# Patient Record
Sex: Male | Born: 1935 | Race: Black or African American | Hispanic: No | Marital: Married | Smoking: Former smoker
Health system: Southern US, Community
[De-identification: ages and names within clinical notes are randomized; demographics above are authoritative.]

## PROBLEM LIST (undated history)

## (undated) DIAGNOSIS — G619 Inflammatory polyneuropathy, unspecified: Secondary | ICD-10-CM

## (undated) DIAGNOSIS — G629 Polyneuropathy, unspecified: Secondary | ICD-10-CM

## (undated) DIAGNOSIS — M2012 Hallux valgus (acquired), left foot: Secondary | ICD-10-CM

## (undated) DIAGNOSIS — I160 Hypertensive urgency: Secondary | ICD-10-CM

## (undated) DIAGNOSIS — I6381 Other cerebral infarction due to occlusion or stenosis of small artery: Secondary | ICD-10-CM

## (undated) DIAGNOSIS — C61 Malignant neoplasm of prostate: Secondary | ICD-10-CM

## (undated) DIAGNOSIS — N189 Chronic kidney disease, unspecified: Secondary | ICD-10-CM

## (undated) DIAGNOSIS — M2011 Hallux valgus (acquired), right foot: Secondary | ICD-10-CM

## (undated) DIAGNOSIS — R011 Cardiac murmur, unspecified: Secondary | ICD-10-CM

## (undated) DIAGNOSIS — D649 Anemia, unspecified: Secondary | ICD-10-CM

## (undated) DIAGNOSIS — I739 Peripheral vascular disease, unspecified: Secondary | ICD-10-CM

## (undated) DIAGNOSIS — I679 Cerebrovascular disease, unspecified: Secondary | ICD-10-CM

## (undated) DIAGNOSIS — I10 Essential (primary) hypertension: Secondary | ICD-10-CM

## (undated) DIAGNOSIS — M549 Dorsalgia, unspecified: Secondary | ICD-10-CM

## (undated) HISTORY — PX: PROSTATE BIOPSY: SHX241

## (undated) HISTORY — PX: BACK SURGERY: SHX140

## (undated) HISTORY — DX: Hallux valgus (acquired), left foot: M20.12

## (undated) HISTORY — DX: Cardiac murmur, unspecified: R01.1

## (undated) HISTORY — DX: Cerebrovascular disease, unspecified: I67.9

## (undated) HISTORY — DX: Hypertensive urgency: I16.0

## (undated) HISTORY — DX: Polyneuropathy, unspecified: G62.9

## (undated) HISTORY — DX: Peripheral vascular disease, unspecified: I73.9

## (undated) HISTORY — DX: Hallux valgus (acquired), right foot: M20.11

## (undated) HISTORY — DX: Other cerebral infarction due to occlusion or stenosis of small artery: I63.81

## (undated) HISTORY — DX: Anemia, unspecified: D64.9

## (undated) HISTORY — DX: Chronic kidney disease, unspecified: N18.9

## (undated) HISTORY — DX: Dorsalgia, unspecified: M54.9

## (undated) HISTORY — DX: Inflammatory polyneuropathy, unspecified: G61.9

---

## 1998-03-29 ENCOUNTER — Encounter: Payer: Self-pay | Admitting: Specialist

## 1998-03-31 ENCOUNTER — Ambulatory Visit (HOSPITAL_COMMUNITY): Admission: RE | Admit: 1998-03-31 | Discharge: 1998-03-31 | Payer: Self-pay | Admitting: Specialist

## 1998-08-31 ENCOUNTER — Observation Stay (HOSPITAL_COMMUNITY): Admission: RE | Admit: 1998-08-31 | Discharge: 1998-09-01 | Payer: Self-pay | Admitting: Specialist

## 1998-08-31 ENCOUNTER — Encounter: Payer: Self-pay | Admitting: Specialist

## 2003-11-02 ENCOUNTER — Ambulatory Visit (HOSPITAL_COMMUNITY): Admission: RE | Admit: 2003-11-02 | Discharge: 2003-11-02 | Payer: Self-pay | Admitting: Gastroenterology

## 2011-01-21 ENCOUNTER — Emergency Department (INDEPENDENT_AMBULATORY_CARE_PROVIDER_SITE_OTHER): Payer: Medicare Other

## 2011-01-21 ENCOUNTER — Encounter: Payer: Self-pay | Admitting: *Deleted

## 2011-01-21 ENCOUNTER — Other Ambulatory Visit: Payer: Self-pay

## 2011-01-21 ENCOUNTER — Emergency Department (HOSPITAL_BASED_OUTPATIENT_CLINIC_OR_DEPARTMENT_OTHER)
Admission: EM | Admit: 2011-01-21 | Discharge: 2011-01-21 | Disposition: A | Discharge status: Home / Self Care | Payer: Medicare Other | Attending: Emergency Medicine | Admitting: Emergency Medicine

## 2011-01-21 DIAGNOSIS — Z79899 Other long term (current) drug therapy: Secondary | ICD-10-CM | POA: Insufficient documentation

## 2011-01-21 DIAGNOSIS — E860 Dehydration: Secondary | ICD-10-CM | POA: Insufficient documentation

## 2011-01-21 DIAGNOSIS — R55 Syncope and collapse: Secondary | ICD-10-CM | POA: Insufficient documentation

## 2011-01-21 DIAGNOSIS — I10 Essential (primary) hypertension: Secondary | ICD-10-CM | POA: Insufficient documentation

## 2011-01-21 HISTORY — DX: Essential (primary) hypertension: I10

## 2011-01-21 LAB — URINALYSIS, ROUTINE W REFLEX MICROSCOPIC
Glucose, UA: NEGATIVE mg/dL
Hgb urine dipstick: NEGATIVE
Ketones, ur: 15 mg/dL — AB
Nitrite: NEGATIVE
Protein, ur: 100 mg/dL — AB
Specific Gravity, Urine: 1.025 (ref 1.005–1.030)
Urobilinogen, UA: 1 mg/dL (ref 0.0–1.0)
pH: 5 (ref 5.0–8.0)

## 2011-01-21 LAB — DIFFERENTIAL
Basophils Absolute: 0 10*3/uL (ref 0.0–0.1)
Basophils Relative: 0 % (ref 0–1)
Eosinophils Absolute: 0.1 10*3/uL (ref 0.0–0.7)
Eosinophils Relative: 1 % (ref 0–5)
Lymphocytes Relative: 7 % — ABNORMAL LOW (ref 12–46)
Lymphs Abs: 0.8 10*3/uL (ref 0.7–4.0)
Monocytes Absolute: 0.6 10*3/uL (ref 0.1–1.0)
Monocytes Relative: 5 % (ref 3–12)
Neutro Abs: 10.2 10*3/uL — ABNORMAL HIGH (ref 1.7–7.7)
Neutrophils Relative %: 87 % — ABNORMAL HIGH (ref 43–77)

## 2011-01-21 LAB — BASIC METABOLIC PANEL
BUN: 19 mg/dL (ref 6–23)
CO2: 27 mEq/L (ref 19–32)
Calcium: 9.3 mg/dL (ref 8.4–10.5)
Chloride: 104 mEq/L (ref 96–112)
Creatinine, Ser: 1.3 mg/dL (ref 0.50–1.35)
GFR calc Af Amer: 61 mL/min — ABNORMAL LOW (ref >90)
GFR calc non Af Amer: 53 mL/min — ABNORMAL LOW (ref >90)
Glucose, Bld: 109 mg/dL — ABNORMAL HIGH (ref 70–99)
Potassium: 3.8 mEq/L (ref 3.5–5.1)
Sodium: 140 mEq/L (ref 135–145)

## 2011-01-21 LAB — CBC
HCT: 36.5 % — ABNORMAL LOW (ref 39.0–52.0)
Hemoglobin: 12 g/dL — ABNORMAL LOW (ref 13.0–17.0)
MCH: 29.9 pg (ref 26.0–34.0)
MCHC: 32.9 g/dL (ref 30.0–36.0)
MCV: 91 fL (ref 78.0–100.0)
Platelets: 237 10*3/uL (ref 150–400)
RBC: 4.01 MIL/uL — ABNORMAL LOW (ref 4.22–5.81)
RDW: 11.7 % (ref 11.5–15.5)
WBC: 11.7 10*3/uL — ABNORMAL HIGH (ref 4.0–10.5)

## 2011-01-21 LAB — URINE MICROSCOPIC-ADD ON

## 2011-01-21 LAB — TROPONIN I: Troponin I: <0.3 ng/mL (ref <0.30)

## 2011-01-21 NOTE — ED Notes (Signed)
MD at bedside. 

## 2011-01-21 NOTE — ED Notes (Signed)
Pt reports a syncopal episode PTA at approximately 1500.  Spouse reports pt had extremity "shaking".  Pt denies any pain or other symptoms at present time.

## 2011-01-21 NOTE — ED Notes (Signed)
Had a syncopal episode. He was passed out for a few seconds. States he feels fine now.

## 2011-01-21 NOTE — ED Provider Notes (Signed)
History   This chart was scribed for Cyndra Numbers, MD by Melba Coon. The patient was seen in room MH04/MH04 and the patient's care was started at 4:50PM    CSN: [id number]  Arrival date & time 01/21/11  1638   First MD Initiated Contact with Patient 01/21/11 1648      Chief Complaint  Patient presents with  . Loss of Consciousness    (Consider location/radiation/quality/duration/timing/severity/associated sxs/prior treatment) HPI  Derek Blackburn is a 75 y.o. male who presents to the Emergency Department complaining of possible syncope with an onset 2 hrs ago. Pt believes he had a food reaction to ham (hasn't eaten pork in 4 yrs). Pt was talking with his son when he became lightheaded for a few seconds but claims he did not lose consciousness. Wife witnessed slight shaking "like chills". Pt did not fall out of chair. During the event, he could hear everything around him. There was no associated incontinence. After the event, wife called for help, thinking it was a stroke, and son drove them to the ED. Pt did not want to come into the ED. At the time of physical, pt stated that he felt perfectly fine. No numbness, tingling, trouble breathing, swelling, chest pain, abdominal pain, urination, defecation, dark tarry stools, UTIs, or swelling in throat. Pt recently had a physical 2 months ago; evaluation was normal. No Hx of diabetes.  Patient was feeling fine prior to the event.      Past Medical History  Diagnosis Date  . Hypertension     Past Surgical History  Procedure Date  . Back surgery     History reviewed. No pertinent family history.  History  Substance Use Topics  . Smoking status: Never Smoker   . Smokeless tobacco: Not on file  . Alcohol Use: Yes     occasionally      Review of Systems 10 Systems reviewed and are negative for acute change except as noted in the HPI.  Allergies  Review of patient's allergies indicates no known allergies.  Home  Medications   Current Outpatient Rx  Name Route Sig Dispense Refill  . ASPIRIN 325 MG PO TBEC Oral Take 325 mg by mouth once.      Marland Kitchen BIMATOPROST 0.03 % OP SOLN Both Eyes Place 1 drop into both eyes at bedtime.      Marland Kitchen DOXAZOSIN MESYLATE 4 MG PO TABS Oral Take 4 mg by mouth daily.     . ADULT MULTIVITAMIN W/MINERALS CH Oral Take 1 tablet by mouth daily.      . TRANDOLAPRIL 2 MG PO TABS Oral Take 2 mg by mouth daily.      Marland Kitchen VERAPAMIL HCL 240 MG (CO) PO TB24 Oral Take 240 mg by mouth daily.       BP 122/72  Pulse 77  Temp(Src) 97.5 F (36.4 C) (Oral)  Resp 16  Ht 5\' 10"  (1.778 m)  Wt 172 lb (78.019 kg)  BMI 24.68 kg/m2  SpO2 99%  Physical Exam  Nursing note and vitals reviewed. Constitutional: He is oriented to person, place, and time. He appears well-developed and well-nourished. No distress.  HENT:  Head: Normocephalic and atraumatic.  Eyes: Conjunctivae and EOM are normal. Pupils are equal, round, and reactive to light.  Neck: Normal range of motion. Neck supple. No tracheal deviation present.  Cardiovascular: Normal rate and regular rhythm.   Pulmonary/Chest: Effort normal and breath sounds normal. No respiratory distress.  Abdominal: Soft. He exhibits no distension.  Musculoskeletal:  Normal range of motion. He exhibits no edema.  Neurological: He is alert and oriented to person, place, and time. He has normal strength and normal reflexes. He displays normal reflexes. No cranial nerve deficit or sensory deficit. He exhibits normal muscle tone. Coordination normal.       Nrml finger-to-nose bilateral. Nml heel-to-shin bilateral.  Skin: Skin is warm and dry. No rash noted.  Psychiatric: He has a normal mood and affect. His behavior is normal.    ED Course  Procedures (including critical care time)   Date: 01/21/2011  Rate: 78  Rhythm: normal sinus rhythm  QRS Axis: normal  Intervals: normal  ST/T Wave abnormalities: normal  Conduction Disutrbances:none  Narrative  Interpretation:   Old EKG Reviewed: none available  DIAGNOSTIC STUDIES: Oxygen Saturation is 98% on room air, normal by my interpretation.    COORDINATION OF CARE:  4:50 - Pt told Dr that he feels fine and is ready to go home. Dr agrees to pt wishes after he complete medical tests. Dr advises pt to f/u w/ PCP.  6:30PM - Dr advises discharge; still slightly dehydrated. Pt advised to take oral fluids.pt leaves asymptomatic.   Labs Reviewed  CBC - Abnormal; Notable for the following:    WBC 11.7 (*)    RBC 4.01 (*)    Hemoglobin 12.0 (*)    HCT 36.5 (*)    All other components within normal limits  DIFFERENTIAL - Abnormal; Notable for the following:    Neutrophils Relative 87 (*)    Neutro Abs 10.2 (*)    Lymphocytes Relative 7 (*)    All other components within normal limits  BASIC METABOLIC PANEL - Abnormal; Notable for the following:    Glucose, Bld 109 (*)    GFR calc non Af Amer 53 (*)    GFR calc Af Amer 61 (*)    All other components within normal limits  URINALYSIS, ROUTINE W REFLEX MICROSCOPIC - Abnormal; Notable for the following:    Color, Urine ORANGE (*) BIOCHEMICALS MAY BE AFFECTED BY COLOR   APPearance CLOUDY (*)    Bilirubin Urine SMALL (*)    Ketones, ur 15 (*)    Protein, ur 100 (*)    Leukocytes, UA SMALL (*)    All other components within normal limits  URINE MICROSCOPIC-ADD ON - Abnormal; Notable for the following:    Bacteria, UA FEW (*)    All other components within normal limits  TROPONIN I   Ct Head Wo Contrast  01/21/2011  *RADIOLOGY REPORT*  Clinical Data: Presyncope  CT HEAD WITHOUT CONTRAST  Technique:  Contiguous axial images were obtained from the base of the skull through the vertex without contrast.  Comparison: None.  Findings: There is no evidence for acute hemorrhage, hydrocephalus, mass lesion, or abnormal extra-axial fluid collection.  No definite CT evidence of acute infarct.  Bone windows show the visualized portions of the paranasal  sinuses to be clear.  IMPRESSION: Normal exam.  Original Report Authenticated By: ERIC A. MANSELL, M.D.     1. Dehydration       MDM  At the time of presentation patient was completely asymptomatic with normal vital signs. Given that he had had an episode of near-syncope testing was performed. EKG was within normal limits. CT head showed no acute intracranial hemorrhage. Neurologic exam was completely normal and I did not have significant suspicion for this being a stroke or TIA. Patient describes feeling lightheaded and being able to hear throughout the events.  Additionally presentation was not consistent with a seizure. Patient had had no associated chest pain. Laboratory workup that reflected dehydration. Patient preferred oral rehydration. He declined IV fluids here. He is not lightheaded upon standing. Patient remained completely stable while here. He can followup with his primary care physician. Patient and family are comfortable with plan he was discharged in good condition.  I personally performed the services described in this documentation, which was scribed in my presence. The recorded information has been reviewed and considered.         Cyndra Numbers, MD 01/21/11 [phone]

## 2011-01-21 NOTE — ED Notes (Signed)
Pt transported to CT ?

## 2011-01-21 NOTE — ED Notes (Signed)
Secondary Assessment-  Pt reports he was standing in the kitchen speaking with his wife when he had a sudden onset of near syncope, dizziness and tremors that lasted seconds.  Denies any confusion, incontinence, pain or other symptoms.  States he feels fine now.

## 2011-03-26 DIAGNOSIS — H409 Unspecified glaucoma: Secondary | ICD-10-CM | POA: Diagnosis not present

## 2011-03-26 DIAGNOSIS — H4010X0 Unspecified open-angle glaucoma, stage unspecified: Secondary | ICD-10-CM | POA: Diagnosis not present

## 2011-03-26 DIAGNOSIS — H2510 Age-related nuclear cataract, unspecified eye: Secondary | ICD-10-CM | POA: Diagnosis not present

## 2011-09-24 DIAGNOSIS — H409 Unspecified glaucoma: Secondary | ICD-10-CM | POA: Diagnosis not present

## 2011-09-24 DIAGNOSIS — H4010X0 Unspecified open-angle glaucoma, stage unspecified: Secondary | ICD-10-CM | POA: Diagnosis not present

## 2011-10-28 DIAGNOSIS — R609 Edema, unspecified: Secondary | ICD-10-CM | POA: Diagnosis not present

## 2011-10-28 DIAGNOSIS — Z79899 Other long term (current) drug therapy: Secondary | ICD-10-CM | POA: Diagnosis not present

## 2011-10-28 DIAGNOSIS — I10 Essential (primary) hypertension: Secondary | ICD-10-CM | POA: Diagnosis not present

## 2011-11-05 DIAGNOSIS — I825Z9 Chronic embolism and thrombosis of unspecified deep veins of unspecified distal lower extremity: Secondary | ICD-10-CM | POA: Diagnosis not present

## 2011-11-05 DIAGNOSIS — M7989 Other specified soft tissue disorders: Secondary | ICD-10-CM | POA: Diagnosis not present

## 2011-11-05 DIAGNOSIS — R609 Edema, unspecified: Secondary | ICD-10-CM | POA: Diagnosis not present

## 2011-11-05 DIAGNOSIS — I824Y9 Acute embolism and thrombosis of unspecified deep veins of unspecified proximal lower extremity: Secondary | ICD-10-CM | POA: Diagnosis not present

## 2011-11-12 DIAGNOSIS — Z7901 Long term (current) use of anticoagulants: Secondary | ICD-10-CM | POA: Diagnosis not present

## 2011-11-19 DIAGNOSIS — Z7901 Long term (current) use of anticoagulants: Secondary | ICD-10-CM | POA: Diagnosis not present

## 2011-12-17 DIAGNOSIS — Z7901 Long term (current) use of anticoagulants: Secondary | ICD-10-CM | POA: Diagnosis not present

## 2011-12-31 DIAGNOSIS — Z7901 Long term (current) use of anticoagulants: Secondary | ICD-10-CM | POA: Diagnosis not present

## 2012-01-02 DIAGNOSIS — Z125 Encounter for screening for malignant neoplasm of prostate: Secondary | ICD-10-CM | POA: Diagnosis not present

## 2012-01-02 DIAGNOSIS — Z1212 Encounter for screening for malignant neoplasm of rectum: Secondary | ICD-10-CM | POA: Diagnosis not present

## 2012-01-02 DIAGNOSIS — Z79899 Other long term (current) drug therapy: Secondary | ICD-10-CM | POA: Diagnosis not present

## 2012-01-02 DIAGNOSIS — I825Z9 Chronic embolism and thrombosis of unspecified deep veins of unspecified distal lower extremity: Secondary | ICD-10-CM | POA: Diagnosis not present

## 2012-01-02 DIAGNOSIS — Z0000 Encounter for general adult medical examination without abnormal findings: Secondary | ICD-10-CM | POA: Diagnosis not present

## 2012-01-02 DIAGNOSIS — I10 Essential (primary) hypertension: Secondary | ICD-10-CM | POA: Diagnosis not present

## 2012-01-02 DIAGNOSIS — Z7901 Long term (current) use of anticoagulants: Secondary | ICD-10-CM | POA: Diagnosis not present

## 2012-01-02 DIAGNOSIS — E559 Vitamin D deficiency, unspecified: Secondary | ICD-10-CM | POA: Diagnosis not present

## 2012-01-06 ENCOUNTER — Other Ambulatory Visit: Payer: Self-pay | Admitting: Internal Medicine

## 2012-01-06 DIAGNOSIS — R599 Enlarged lymph nodes, unspecified: Secondary | ICD-10-CM

## 2012-01-09 ENCOUNTER — Ambulatory Visit
Admission: RE | Admit: 2012-01-09 | Discharge: 2012-01-09 | Disposition: A | Discharge status: Home / Self Care | Payer: Medicare Other | Source: Ambulatory Visit | Attending: Internal Medicine | Admitting: Internal Medicine

## 2012-01-09 DIAGNOSIS — R599 Enlarged lymph nodes, unspecified: Secondary | ICD-10-CM

## 2012-01-09 MED ORDER — IOHEXOL 300 MG/ML  SOLN
100.0000 mL | Freq: Once | INTRAMUSCULAR | Status: AC | PRN
  Administered 2012-01-09: 100 mL via INTRAVENOUS

## 2012-02-03 DIAGNOSIS — Z7901 Long term (current) use of anticoagulants: Secondary | ICD-10-CM | POA: Diagnosis not present

## 2012-02-18 DIAGNOSIS — Z7901 Long term (current) use of anticoagulants: Secondary | ICD-10-CM | POA: Diagnosis not present

## 2012-03-17 DIAGNOSIS — Z7901 Long term (current) use of anticoagulants: Secondary | ICD-10-CM | POA: Diagnosis not present

## 2012-04-17 DIAGNOSIS — Z7901 Long term (current) use of anticoagulants: Secondary | ICD-10-CM | POA: Diagnosis not present

## 2012-04-17 DIAGNOSIS — I10 Essential (primary) hypertension: Secondary | ICD-10-CM | POA: Diagnosis not present

## 2012-04-17 DIAGNOSIS — Z86718 Personal history of other venous thrombosis and embolism: Secondary | ICD-10-CM | POA: Diagnosis not present

## 2012-04-24 DIAGNOSIS — Z86718 Personal history of other venous thrombosis and embolism: Secondary | ICD-10-CM | POA: Diagnosis not present

## 2012-04-24 DIAGNOSIS — I10 Essential (primary) hypertension: Secondary | ICD-10-CM | POA: Diagnosis not present

## 2012-04-24 DIAGNOSIS — Z7901 Long term (current) use of anticoagulants: Secondary | ICD-10-CM | POA: Diagnosis not present

## 2012-05-08 DIAGNOSIS — Z79899 Other long term (current) drug therapy: Secondary | ICD-10-CM | POA: Diagnosis not present

## 2012-05-08 DIAGNOSIS — I10 Essential (primary) hypertension: Secondary | ICD-10-CM | POA: Diagnosis not present

## 2012-05-08 DIAGNOSIS — N401 Enlarged prostate with lower urinary tract symptoms: Secondary | ICD-10-CM | POA: Diagnosis not present

## 2012-05-08 DIAGNOSIS — Z86718 Personal history of other venous thrombosis and embolism: Secondary | ICD-10-CM | POA: Diagnosis not present

## 2012-05-19 DIAGNOSIS — H409 Unspecified glaucoma: Secondary | ICD-10-CM | POA: Diagnosis not present

## 2012-05-19 DIAGNOSIS — H4011X0 Primary open-angle glaucoma, stage unspecified: Secondary | ICD-10-CM | POA: Diagnosis not present

## 2012-06-09 DIAGNOSIS — I825Z9 Chronic embolism and thrombosis of unspecified deep veins of unspecified distal lower extremity: Secondary | ICD-10-CM | POA: Diagnosis not present

## 2012-06-09 DIAGNOSIS — Z7901 Long term (current) use of anticoagulants: Secondary | ICD-10-CM | POA: Diagnosis not present

## 2012-06-09 DIAGNOSIS — I10 Essential (primary) hypertension: Secondary | ICD-10-CM | POA: Diagnosis not present

## 2012-06-09 DIAGNOSIS — Z79899 Other long term (current) drug therapy: Secondary | ICD-10-CM | POA: Diagnosis not present

## 2012-12-01 DIAGNOSIS — H25019 Cortical age-related cataract, unspecified eye: Secondary | ICD-10-CM | POA: Diagnosis not present

## 2012-12-01 DIAGNOSIS — H409 Unspecified glaucoma: Secondary | ICD-10-CM | POA: Diagnosis not present

## 2012-12-01 DIAGNOSIS — H4011X0 Primary open-angle glaucoma, stage unspecified: Secondary | ICD-10-CM | POA: Diagnosis not present

## 2012-12-01 DIAGNOSIS — H2510 Age-related nuclear cataract, unspecified eye: Secondary | ICD-10-CM | POA: Diagnosis not present

## 2013-01-06 DIAGNOSIS — N529 Male erectile dysfunction, unspecified: Secondary | ICD-10-CM | POA: Diagnosis not present

## 2013-01-06 DIAGNOSIS — L909 Atrophic disorder of skin, unspecified: Secondary | ICD-10-CM | POA: Diagnosis not present

## 2013-01-06 DIAGNOSIS — N401 Enlarged prostate with lower urinary tract symptoms: Secondary | ICD-10-CM | POA: Diagnosis not present

## 2013-01-06 DIAGNOSIS — I10 Essential (primary) hypertension: Secondary | ICD-10-CM | POA: Diagnosis not present

## 2013-01-06 DIAGNOSIS — Z0000 Encounter for general adult medical examination without abnormal findings: Secondary | ICD-10-CM | POA: Diagnosis not present

## 2013-01-06 DIAGNOSIS — E559 Vitamin D deficiency, unspecified: Secondary | ICD-10-CM | POA: Diagnosis not present

## 2013-01-06 DIAGNOSIS — N138 Other obstructive and reflux uropathy: Secondary | ICD-10-CM | POA: Diagnosis not present

## 2013-01-06 DIAGNOSIS — R7309 Other abnormal glucose: Secondary | ICD-10-CM | POA: Diagnosis not present

## 2013-01-06 DIAGNOSIS — Z79899 Other long term (current) drug therapy: Secondary | ICD-10-CM | POA: Diagnosis not present

## 2013-01-07 DIAGNOSIS — E559 Vitamin D deficiency, unspecified: Secondary | ICD-10-CM | POA: Diagnosis not present

## 2013-01-07 DIAGNOSIS — N401 Enlarged prostate with lower urinary tract symptoms: Secondary | ICD-10-CM | POA: Diagnosis not present

## 2013-01-07 DIAGNOSIS — I10 Essential (primary) hypertension: Secondary | ICD-10-CM | POA: Diagnosis not present

## 2013-06-01 DIAGNOSIS — H409 Unspecified glaucoma: Secondary | ICD-10-CM | POA: Diagnosis not present

## 2013-06-01 DIAGNOSIS — H4011X0 Primary open-angle glaucoma, stage unspecified: Secondary | ICD-10-CM | POA: Diagnosis not present

## 2013-07-19 DIAGNOSIS — Z79899 Other long term (current) drug therapy: Secondary | ICD-10-CM | POA: Diagnosis not present

## 2013-07-19 DIAGNOSIS — E559 Vitamin D deficiency, unspecified: Secondary | ICD-10-CM | POA: Diagnosis not present

## 2013-07-19 DIAGNOSIS — R609 Edema, unspecified: Secondary | ICD-10-CM | POA: Diagnosis not present

## 2013-07-19 DIAGNOSIS — I10 Essential (primary) hypertension: Secondary | ICD-10-CM | POA: Diagnosis not present

## 2013-12-07 DIAGNOSIS — H2513 Age-related nuclear cataract, bilateral: Secondary | ICD-10-CM | POA: Diagnosis not present

## 2013-12-07 DIAGNOSIS — H4011X3 Primary open-angle glaucoma, severe stage: Secondary | ICD-10-CM | POA: Diagnosis not present

## 2014-02-08 DIAGNOSIS — R7309 Other abnormal glucose: Secondary | ICD-10-CM | POA: Diagnosis not present

## 2014-02-08 DIAGNOSIS — N400 Enlarged prostate without lower urinary tract symptoms: Secondary | ICD-10-CM | POA: Diagnosis not present

## 2014-02-08 DIAGNOSIS — I10 Essential (primary) hypertension: Secondary | ICD-10-CM | POA: Diagnosis not present

## 2014-02-08 DIAGNOSIS — Z79899 Other long term (current) drug therapy: Secondary | ICD-10-CM | POA: Diagnosis not present

## 2014-02-08 DIAGNOSIS — E559 Vitamin D deficiency, unspecified: Secondary | ICD-10-CM | POA: Diagnosis not present

## 2014-06-22 DIAGNOSIS — H4011X3 Primary open-angle glaucoma, severe stage: Secondary | ICD-10-CM | POA: Diagnosis not present

## 2014-08-09 DIAGNOSIS — I10 Essential (primary) hypertension: Secondary | ICD-10-CM | POA: Diagnosis not present

## 2014-08-09 DIAGNOSIS — R7309 Other abnormal glucose: Secondary | ICD-10-CM | POA: Diagnosis not present

## 2014-08-09 DIAGNOSIS — Z0000 Encounter for general adult medical examination without abnormal findings: Secondary | ICD-10-CM | POA: Diagnosis not present

## 2014-08-09 DIAGNOSIS — Z79899 Other long term (current) drug therapy: Secondary | ICD-10-CM | POA: Diagnosis not present

## 2014-08-09 DIAGNOSIS — E559 Vitamin D deficiency, unspecified: Secondary | ICD-10-CM | POA: Diagnosis not present

## 2014-08-09 DIAGNOSIS — N400 Enlarged prostate without lower urinary tract symptoms: Secondary | ICD-10-CM | POA: Diagnosis not present

## 2014-08-09 DIAGNOSIS — Z1389 Encounter for screening for other disorder: Secondary | ICD-10-CM | POA: Diagnosis not present

## 2014-08-09 DIAGNOSIS — N521 Erectile dysfunction due to diseases classified elsewhere: Secondary | ICD-10-CM | POA: Diagnosis not present

## 2014-08-09 DIAGNOSIS — Z125 Encounter for screening for malignant neoplasm of prostate: Secondary | ICD-10-CM | POA: Diagnosis not present

## 2015-07-06 DIAGNOSIS — F5221 Male erectile disorder: Secondary | ICD-10-CM | POA: Diagnosis not present

## 2015-07-06 DIAGNOSIS — I10 Essential (primary) hypertension: Secondary | ICD-10-CM | POA: Diagnosis not present

## 2015-07-06 DIAGNOSIS — N400 Enlarged prostate without lower urinary tract symptoms: Secondary | ICD-10-CM | POA: Diagnosis not present

## 2015-07-06 DIAGNOSIS — Z79899 Other long term (current) drug therapy: Secondary | ICD-10-CM | POA: Diagnosis not present

## 2016-01-01 DIAGNOSIS — Z79899 Other long term (current) drug therapy: Secondary | ICD-10-CM | POA: Diagnosis not present

## 2016-01-01 DIAGNOSIS — L723 Sebaceous cyst: Secondary | ICD-10-CM | POA: Diagnosis not present

## 2016-01-01 DIAGNOSIS — N400 Enlarged prostate without lower urinary tract symptoms: Secondary | ICD-10-CM | POA: Diagnosis not present

## 2016-01-01 DIAGNOSIS — I10 Essential (primary) hypertension: Secondary | ICD-10-CM | POA: Diagnosis not present

## 2016-07-04 DIAGNOSIS — I10 Essential (primary) hypertension: Secondary | ICD-10-CM | POA: Diagnosis not present

## 2016-07-04 DIAGNOSIS — E559 Vitamin D deficiency, unspecified: Secondary | ICD-10-CM | POA: Diagnosis not present

## 2016-07-04 DIAGNOSIS — F5221 Male erectile disorder: Secondary | ICD-10-CM | POA: Diagnosis not present

## 2016-07-04 DIAGNOSIS — Z0000 Encounter for general adult medical examination without abnormal findings: Secondary | ICD-10-CM | POA: Diagnosis not present

## 2016-07-04 DIAGNOSIS — Z79899 Other long term (current) drug therapy: Secondary | ICD-10-CM | POA: Diagnosis not present

## 2016-07-04 DIAGNOSIS — N400 Enlarged prostate without lower urinary tract symptoms: Secondary | ICD-10-CM | POA: Diagnosis not present

## 2016-07-17 ENCOUNTER — Other Ambulatory Visit: Payer: Self-pay | Admitting: Physician Assistant

## 2016-07-17 DIAGNOSIS — D229 Melanocytic nevi, unspecified: Secondary | ICD-10-CM | POA: Diagnosis not present

## 2016-07-17 DIAGNOSIS — L821 Other seborrheic keratosis: Secondary | ICD-10-CM | POA: Diagnosis not present

## 2016-07-17 DIAGNOSIS — D492 Neoplasm of unspecified behavior of bone, soft tissue, and skin: Secondary | ICD-10-CM | POA: Diagnosis not present

## 2016-08-22 ENCOUNTER — Ambulatory Visit (INDEPENDENT_AMBULATORY_CARE_PROVIDER_SITE_OTHER): Payer: Self-pay | Admitting: Orthopedic Surgery

## 2016-09-02 DIAGNOSIS — D485 Neoplasm of uncertain behavior of skin: Secondary | ICD-10-CM | POA: Diagnosis not present

## 2016-09-11 DIAGNOSIS — H401133 Primary open-angle glaucoma, bilateral, severe stage: Secondary | ICD-10-CM | POA: Diagnosis not present

## 2016-09-11 DIAGNOSIS — H2513 Age-related nuclear cataract, bilateral: Secondary | ICD-10-CM | POA: Diagnosis not present

## 2016-09-11 DIAGNOSIS — H524 Presbyopia: Secondary | ICD-10-CM | POA: Diagnosis not present

## 2018-05-19 ENCOUNTER — Other Ambulatory Visit (HOSPITAL_COMMUNITY): Payer: Self-pay | Admitting: Urology

## 2018-05-19 ENCOUNTER — Other Ambulatory Visit: Payer: Self-pay | Admitting: Urology

## 2018-05-19 DIAGNOSIS — C61 Malignant neoplasm of prostate: Secondary | ICD-10-CM

## 2018-05-19 DIAGNOSIS — R972 Elevated prostate specific antigen [PSA]: Secondary | ICD-10-CM

## 2018-05-26 ENCOUNTER — Encounter (HOSPITAL_COMMUNITY)
Admission: RE | Admit: 2018-05-26 | Discharge: 2018-05-26 | Disposition: A | Discharge status: Home / Self Care | Payer: Medicare Other | Source: Ambulatory Visit | Attending: Urology | Admitting: Urology

## 2018-05-26 ENCOUNTER — Ambulatory Visit (HOSPITAL_COMMUNITY): Payer: Medicare Other

## 2018-05-26 ENCOUNTER — Other Ambulatory Visit: Payer: Self-pay

## 2018-05-26 DIAGNOSIS — R972 Elevated prostate specific antigen [PSA]: Secondary | ICD-10-CM | POA: Diagnosis present

## 2018-05-26 DIAGNOSIS — C61 Malignant neoplasm of prostate: Secondary | ICD-10-CM | POA: Diagnosis present

## 2018-05-26 MED ORDER — TECHNETIUM TC 99M MEDRONATE IV KIT
20.0000 | PACK | Freq: Once | INTRAVENOUS | Status: AC | PRN
Start: 2018-05-26 — End: 2018-05-26
  Administered 2018-05-26: 20 via INTRAVENOUS

## 2018-06-02 ENCOUNTER — Other Ambulatory Visit (HOSPITAL_COMMUNITY): Payer: Self-pay | Admitting: Urology

## 2018-06-02 ENCOUNTER — Other Ambulatory Visit: Payer: Self-pay | Admitting: Urology

## 2018-06-02 DIAGNOSIS — C7952 Secondary malignant neoplasm of bone marrow: Principal | ICD-10-CM

## 2018-06-02 DIAGNOSIS — C7951 Secondary malignant neoplasm of bone: Secondary | ICD-10-CM

## 2018-11-27 ENCOUNTER — Other Ambulatory Visit: Payer: Self-pay

## 2018-11-27 ENCOUNTER — Encounter (HOSPITAL_COMMUNITY)
Admission: RE | Admit: 2018-11-27 | Discharge: 2018-11-27 | Disposition: A | Discharge status: Home / Self Care | Payer: Medicare Other | Source: Ambulatory Visit | Attending: Urology | Admitting: Urology

## 2018-11-27 DIAGNOSIS — C7951 Secondary malignant neoplasm of bone: Secondary | ICD-10-CM | POA: Insufficient documentation

## 2018-11-27 DIAGNOSIS — C7952 Secondary malignant neoplasm of bone marrow: Secondary | ICD-10-CM | POA: Diagnosis present

## 2018-11-27 MED ORDER — TECHNETIUM TC 99M MEDRONATE IV KIT: 20.0000 | PACK | Freq: Once | INTRAVENOUS | Status: DC | PRN

## 2019-12-14 ENCOUNTER — Telehealth: Payer: Self-pay | Admitting: *Deleted

## 2019-12-14 NOTE — Telephone Encounter (Signed)
LVM for callback to schedule appointment with Dr. Tammi Klippel

## 2019-12-15 ENCOUNTER — Telehealth: Payer: Self-pay | Admitting: *Deleted

## 2019-12-15 NOTE — Telephone Encounter (Signed)
LVM for callback to schedule appointment with Dr. Tammi Klippel.

## 2020-01-06 ENCOUNTER — Telehealth: Payer: Self-pay | Admitting: Radiation Oncology

## 2020-01-06 NOTE — Telephone Encounter (Signed)
Patient states he is traveling. Wants to r/s appt to 02/08/20. Letting Rad Onc team know.

## 2020-01-07 ENCOUNTER — Ambulatory Visit: Payer: Medicare Other

## 2020-01-07 ENCOUNTER — Ambulatory Visit: Payer: Medicare Other | Admitting: Radiation Oncology

## 2020-02-07 ENCOUNTER — Encounter: Payer: Self-pay | Admitting: Radiation Oncology

## 2020-02-07 ENCOUNTER — Telehealth: Payer: Self-pay | Admitting: Radiation Oncology

## 2020-02-07 NOTE — Telephone Encounter (Signed)
Received voicemail message from patient requesting return call. Phoned patient to inquire. Patient has questions about telephone consult tomorrow. Answered all patient questions to the best of my ability. Patient verbalized understanding of all reviewed and appreciation for the callback.

## 2020-02-07 NOTE — Progress Notes (Signed)
GU Location of Tumor / Histology: prostatic adenocarcinoma  If Prostate Cancer, Gleason Score is (4 + 4) and PSA is (18). Prostate volume: 86 g.    Biopsies of prostate (if applicable) revealed:   Past/Anticipated interventions by urology, if any: prostate biopsy, bone scan, PMDC, Eligard (12/06/2019), continue myrbetriq, referral back to Dr. Tammi Klippel for consideration of curative radiation therapy  Past/Anticipated interventions by medical oncology, if any: no  Weight changes, if any: denies  Bowel/Bladder complaints, if any: IPSS 5. SHIM 14. Reports rare dysuria. Denies hematuria. Denies urinary leakage or incontinence. Denies any bowel complaints.   Nausea/Vomiting, if any: denies  Pain issues, if any:  Reports constant chronic bilateral shoulder and knee pain. Denies any new pain.  SAFETY ISSUES:  Prior radiation? denies  Pacemaker/ICD? denies  Possible current pregnancy? no, male patient  Is the patient on methotrexate? denies  Current Complaints / other details:  85 year old male. Married with 2 sons and 1 daughter. Resides in [place].

## 2020-02-08 ENCOUNTER — Other Ambulatory Visit: Payer: Self-pay

## 2020-02-08 ENCOUNTER — Ambulatory Visit
Admission: RE | Admit: 2020-02-08 | Discharge: 2020-02-08 | Disposition: A | Discharge status: Home / Self Care | Payer: Medicare Other | Source: Ambulatory Visit | Attending: Radiation Oncology | Admitting: Radiation Oncology

## 2020-02-08 ENCOUNTER — Encounter: Payer: Self-pay | Admitting: Radiation Oncology

## 2020-02-08 VITALS — Ht 70.0 in | Wt 170.0 lb

## 2020-02-08 DIAGNOSIS — C61 Malignant neoplasm of prostate: Secondary | ICD-10-CM

## 2020-02-08 HISTORY — DX: Malignant neoplasm of prostate: C61

## 2020-02-08 NOTE — Progress Notes (Signed)
Radiation Oncology         [phone] ________________________________  Initial Outpatient Consultation - Conducted via Telephone due to current COVID-19 concerns for limiting patient exposure  Name: Derek Blackburn MRN: [id number]  Date: 02/08/2020  DOB: 1935-09-18  OT:[id number], Gayland Curry, FNP  Festus Aloe, MD   REFERRING PHYSICIAN: Festus Aloe, MD  DIAGNOSIS: 85 y.o. gentleman with Stage T1c adenocarcinoma of the prostate with Gleason score of 4+4, and undetectable PSA on combination ADT (Lupron/Nubeqa) since 05/2018.    ICD-10-CM   1. Malignant neoplasm of prostate ([place])  C61     HISTORY OF PRESENT ILLNESS: Derek Blackburn is a 85 y.o. male with a diagnosis of prostate cancer. He has a longstanding history of an elevated PSA dating back to at least 07/2014 when it was 5.8. He was referred to urology at that time but did not show up for consult. The PSA was further elevated at 9.5 in 06/2016 and continued to rise to 18.4 in 02/2018. The patient was again referred to urology for further evaluation and met with Dr. Karsten Ro in consult on 04/07/2018, digital rectal exam was benign at that time. He proceeded to transrectal ultrasound with 12 biopsies of the prostate on 04/30/2018.  The prostate volume measured 85.96 cc.  Out of 12 core biopsies, 1 was positive.  The maximum Gleason score was 4+4, and this was seen in the right apex lateral.   He underwent staging studies on 05/26/2018 consisting of CT A/P and bone scan. CT A/P showed prostatomegaly without specific evidence of abdominal or pelvic metastatic disease and small bilateral pelvic sidewall, iliac, and retroperitoneal lymph nodes without overt lymphadenopathy. Bone scan showed abnormal tracer accumulation in the inferior right scapula and posterior right 8th rib, highly suspicious for osseous metastases.  He was started on Lupron and Nubeqa on 06/02/2018, resulting in an excellent PSA response down to undetectable level.  Restaging bone scan on 11/27/2018 showed stability in the two areas of uptake and no new areas of metastasis. He has continued on 76-monthEligard injections in combination with daily Nubeqa and as of 11/29/2019, his PSA has remained undetectable. His most recent 615-monthose of Eligard was given on 12/06/2019. He continues to tolerate the combination ADT very well.  His case was recently presented at the GU multidisciplinary conference on 12/10/2019 and the consensus recommendation was to proceed with definitive radiation therapy to the prostate/pelvis in an effort to gain more durable control of his disease and possibly allow him to discontinue hormonal therapy once radiotherapy was completed.  The patient reviewed the imaging and lab results with his urologist and he has kindly been referred today for discussion of potential radiation treatment options.  PREVIOUS RADIATION THERAPY: No  PAST MEDICAL HISTORY:  Past Medical History:  Diagnosis Date  . Hypertension   . [place]state cancer ([facility]      PAST SURGICAL HISTORY: Past Surgical History:  Procedure Laterality Date  . BACK SURGERY    . PROSTATE BIOPSY      FAMILY HISTORY:  Family History  Problem Relation Age of Onset  . Breast cancer Neg Hx   . Colon cancer Neg Hx   . Prostate cancer Neg Hx   . Pancreatic cancer Neg Hx     SOCIAL HISTORY:  Social History   Socioeconomic History  . Marital status: Married    Spouse name: Not on file  . Number of children: 3  . Years of education: Not on file  . Highest  education level: Not on file  Occupational History    Comment: retired  Tobacco Use  . Smoking status: Never Smoker  . Smokeless tobacco: Never Used  Vaping Use  . Vaping Use: Never used  Substance and Sexual Activity  . Alcohol use: Yes    Comment: occasionally  . Drug use: No  . Sexual activity: Yes  Other Topics Concern  . Not on file  Social History Narrative   2 sons, 1 daughter   Social Determinants of  Health   Financial Resource Strain: Not on file  Food Insecurity: Not on file  Transportation Needs: Not on file  Physical Activity: Not on file  Stress: Not on file  Social Connections: Not on file  Intimate Partner Violence: Not on file    ALLERGIES: Patient has no known allergies.  MEDICATIONS:  Current Outpatient Medications  Medication Sig Dispense Refill  . bimatoprost (LUMIGAN) 0.03 % ophthalmic solution Place 1 drop into both eyes at bedtime.    Marland Kitchen doxazosin (CARDURA) 4 MG tablet Take 4 mg by mouth daily.    . hydrALAZINE (APRESOLINE) 25 MG tablet Take 25 mg by mouth 2 (two) times daily.    Marland Kitchen latanoprost (XALATAN) 0.005 % ophthalmic solution 1 drop at bedtime.    . meloxicam (MOBIC) 7.5 MG tablet Take 7.5 mg by mouth daily.    . Multiple Vitamin (MULITIVITAMIN WITH MINERALS) TABS Take 1 tablet by mouth daily.    Marland Kitchen oxybutynin (DITROPAN) 5 MG tablet Take 5 mg by mouth daily. Reports taking this medication only once per day.    . trandolapril (MAVIK) 2 MG tablet Take 2 mg by mouth daily.    . verapamil (VERELAN PM) 240 MG 24 hr capsule Take 240 mg by mouth daily.     No current facility-administered medications for this encounter.    REVIEW OF SYSTEMS:  On review of systems, the patient reports that he is doing well overall. He denies any chest pain, shortness of breath, cough, fevers, chills, night sweats, unintended weight changes. He denies any bowel disturbances, and denies abdominal pain, nausea or vomiting. He denies any new musculoskeletal or joint aches or pains. His IPSS was 5, indicating mild urinary symptoms. He reports rare dysuria. His SHIM was 14, indicating he has moderate erectile dysfunction. A complete review of systems is obtained and is otherwise negative.    PHYSICAL EXAM:  Wt Readings from Last 3 Encounters:  02/08/20 170 lb (77.1 kg)  01/21/11 172 lb (78 kg)   Temp Readings from Last 3 Encounters:  01/21/11 97.5 F (36.4 C) (Oral)   BP Readings from  Last 3 Encounters:  01/21/11 128/75   Pulse Readings from Last 3 Encounters:  01/21/11 65   Pain Assessment Pain Score: 1  Pain Frequency: Constant Pain Loc: Shoulder (Reports constant chronic bilateral knee and shoulder pain. Denies new pain.)/10  Physical exam not performed in light of telephone consult visit format.   KPS = 90  100 - Normal; no complaints; no evidence of disease. 90   - Able to carry on normal activity; minor signs or symptoms of disease. 80   - Normal activity with effort; some signs or symptoms of disease. 76   - Cares for self; unable to carry on normal activity or to do active work. 60   - Requires occasional assistance, but is able to care for most of his personal needs. 50   - Requires considerable assistance and frequent medical care. 26   - Disabled;  requires special care and assistance. 15   - Severely disabled; hospital admission is indicated although death not imminent. 17   - Very sick; hospital admission necessary; active supportive treatment necessary. 10   - Moribund; fatal processes progressing rapidly. 0     - Dead  Karnofsky DA, Abelmann Graettinger, Craver LS and Burchenal [facility] [phone]) The use of the nitrogen mustards in the palliative treatment of carcinoma: with particular reference to bronchogenic carcinoma Cancer 1 634-56  LABORATORY DATA:  Lab Results  Component Value Date   WBC 11.7 (H) 01/21/2011   HGB 12.0 (L) 01/21/2011   HCT 36.5 (L) 01/21/2011   MCV 91.0 01/21/2011   PLT 237 01/21/2011   Lab Results  Component Value Date   NA 140 01/21/2011   K 3.8 01/21/2011   CL 104 01/21/2011   CO2 27 01/21/2011   No results found for: ALT, AST, GGT, ALKPHOS, BILITOT   RADIOGRAPHY: No results found.    IMPRESSION/PLAN: This visit was conducted via Telephone to spare the patient unnecessary potential exposure in the healthcare setting during the current COVID-19 pandemic. 1. 85 y.o. gentleman with Stage T1c adenocarcinoma of the prostate with  Gleason score of 4+4, and and undetectable PSA on combination ADT (Lupron/Nubeqa) since 05/2018. We discussed the patient's workup and outlined the nature of prostate cancer in this setting. 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 Accordingly, he is eligible for a variety of potential treatment options including continuing his current combination ADT with or without 8 weeks of external radiation with consideration for discontinuing the ADT after completion of radiation. We discussed the available radiation techniques, and focused on the details and logistics of delivery. We discussed and outlined the risks, benefits, short and long-term effects associated with radiotherapy. We also discussed that, in general, hormone therapy is effective for 18-24 months before the PSA starts to rise indicating that the cancer has begun to grow again despite castrate levels of testosterone. He appears to have a good understanding of his disease and our treatment recommendations.  He was encouraged to ask questions that were answered to his stated satisfaction.  Given his current situation-- his PSA being undetectable and the fact that he is tolerating his current treatment well with minimal to no side effects, we feel that either option would be appropriate at this time. We encouraged proceeding with radiotherapy, but at the end of the conversation, the patient elects to continue on his current regimen and possibly consider radiation therapy if or when the PSA begins to rise indicating active disease. He states he is working on getting a follow up appointment with Dr. Junious Silk for sometime in February for repeat labs and plans to further review treatment recommendations at that time. We will share our discussion with Dr. Junious Silk and look forward to following his progress, and are happy to continue to participate in the care of this very nice gentleman in  the future if indicated.  Given current concerns for patient exposure during the COVID-19 pandemic, this encounter was conducted via telephone. The patient was notified in advance and was offered a MyChart meeting to allow for face to face communication but unfortunately reported that he did not have the appropriate resources/technology to support such a visit and instead preferred to proceed with telephone consult. The patient has given verbal consent for this type of encounter. The time spent during this encounter was 60 minutes. The attendants for this meeting include Rodman Key  Tammi Klippel MD, Ashlyn Bruning PA-C, [place], and patient [place]. During the encounter, Tyler Pita MD, Ashlyn Bruning PA-C, and scribe, Wilburn Mylar were located at [place].  Patient Lorren Splawn Buescher was located at home.    Nicholos Johns, PA-C    Tyler Pita, MD  [facility] Direct Dial: [phone]  Fax: [phone] [email or web address]  Skype  [email or web address]  This document serves as a record of services personally performed by Tyler Pita, MD and Freeman Caldron, PA-C. It was created on their behalf by Wilburn Mylar, a trained medical scribe. The creation of this record is based on the scribe's personal observations and the provider's statements to them. This document has been checked and approved by the attending provider.

## 2020-05-08 DIAGNOSIS — F411 Generalized anxiety disorder: Secondary | ICD-10-CM | POA: Insufficient documentation

## 2020-05-08 HISTORY — DX: Generalized anxiety disorder: F41.1

## 2020-07-11 DIAGNOSIS — N184 Chronic kidney disease, stage 4 (severe): Secondary | ICD-10-CM

## 2020-07-11 DIAGNOSIS — I10 Essential (primary) hypertension: Secondary | ICD-10-CM | POA: Diagnosis present

## 2020-07-11 DIAGNOSIS — N183 Chronic kidney disease, stage 3 unspecified: Secondary | ICD-10-CM | POA: Insufficient documentation

## 2020-07-11 HISTORY — DX: Chronic kidney disease, stage 4 (severe): N18.4

## 2020-07-11 HISTORY — DX: Essential (primary) hypertension: I10

## 2020-08-31 ENCOUNTER — Other Ambulatory Visit: Payer: Self-pay | Admitting: Nephrology

## 2020-08-31 DIAGNOSIS — N183 Chronic kidney disease, stage 3 unspecified: Secondary | ICD-10-CM

## 2020-09-01 DIAGNOSIS — N3281 Overactive bladder: Secondary | ICD-10-CM

## 2020-09-01 HISTORY — DX: Overactive bladder: N32.81

## 2020-09-08 ENCOUNTER — Other Ambulatory Visit: Payer: Self-pay

## 2020-09-08 ENCOUNTER — Ambulatory Visit
Admission: RE | Admit: 2020-09-08 | Discharge: 2020-09-08 | Disposition: A | Discharge status: Home / Self Care | Payer: Medicare Other | Source: Ambulatory Visit | Attending: Nephrology | Admitting: Nephrology

## 2020-09-08 DIAGNOSIS — N183 Chronic kidney disease, stage 3 unspecified: Secondary | ICD-10-CM

## 2020-10-01 ENCOUNTER — Other Ambulatory Visit: Payer: Self-pay

## 2020-10-01 ENCOUNTER — Emergency Department (HOSPITAL_COMMUNITY)
Admission: EM | Admit: 2020-10-01 | Discharge: 2020-10-01 | Disposition: A | Discharge status: Home / Self Care | Payer: Medicare Other | Attending: Emergency Medicine | Admitting: Emergency Medicine

## 2020-10-01 ENCOUNTER — Encounter (HOSPITAL_COMMUNITY): Payer: Self-pay | Admitting: Emergency Medicine

## 2020-10-01 ENCOUNTER — Emergency Department (HOSPITAL_COMMUNITY): Payer: Medicare Other

## 2020-10-01 DIAGNOSIS — Z23 Encounter for immunization: Secondary | ICD-10-CM | POA: Insufficient documentation

## 2020-10-01 DIAGNOSIS — W228XXA Striking against or struck by other objects, initial encounter: Secondary | ICD-10-CM | POA: Insufficient documentation

## 2020-10-01 DIAGNOSIS — I10 Essential (primary) hypertension: Secondary | ICD-10-CM | POA: Diagnosis not present

## 2020-10-01 DIAGNOSIS — S92512A Displaced fracture of proximal phalanx of left lesser toe(s), initial encounter for closed fracture: Secondary | ICD-10-CM | POA: Insufficient documentation

## 2020-10-01 DIAGNOSIS — Z8546 Personal history of malignant neoplasm of prostate: Secondary | ICD-10-CM | POA: Insufficient documentation

## 2020-10-01 DIAGNOSIS — S92912B Unspecified fracture of left toe(s), initial encounter for open fracture: Secondary | ICD-10-CM

## 2020-10-01 DIAGNOSIS — S99922A Unspecified injury of left foot, initial encounter: Secondary | ICD-10-CM | POA: Diagnosis present

## 2020-10-01 MED ORDER — CEFAZOLIN SODIUM-DEXTROSE 2-4 GM/100ML-% IV SOLN
2.0000 g | Freq: Once | INTRAVENOUS | Status: AC
  Administered 2020-10-01: 2 g via INTRAVENOUS
  Filled 2020-10-01: qty 100

## 2020-10-01 MED ORDER — TETANUS-DIPHTH-ACELL PERTUSSIS 5-2.5-18.5 LF-MCG/0.5 IM SUSY
0.5000 mL | PREFILLED_SYRINGE | Freq: Once | INTRAMUSCULAR | Status: AC
  Administered 2020-10-01: 0.5 mL via INTRAMUSCULAR
  Filled 2020-10-01: qty 0.5

## 2020-10-01 MED ORDER — LIDOCAINE HCL (PF) 1 % IJ SOLN
5.0000 mL | Freq: Once | INTRAMUSCULAR | Status: AC
  Administered 2020-10-01: 5 mL
  Filled 2020-10-01: qty 5

## 2020-10-01 MED ORDER — CEPHALEXIN 500 MG PO CAPS: 500.0000 mg | ORAL_CAPSULE | Freq: Three times a day (TID) | ORAL | 0 refills | Status: AC

## 2020-10-01 NOTE — Discharge Instructions (Addendum)
Follow-up with orthopedic surgery within 7 to 10 days.  The sutures should come out in 10 days as well.  Return immediately if you have purulent drainage fevers worsening pain increased redness pain or any additional concerns. Leave the dressing in place for 2 days, and then change daily.  Place a piece of gauze between the 2 toes and buddy tape them together as they are today.  Return back to the ER if you have any questions or additional concerns.

## 2020-10-01 NOTE — ED Provider Notes (Signed)
[facility]ERGENCY DEPARTMENT Provider Note   CSN: [id number] Arrival date & time: 10/01/20  0422     History Chief Complaint  Patient presents with   Toe Pain    Derek Blackburn is a 85 y.o. male.  Patient states that earlier this morning just a few hours prior to arrival he was trying to walk around his home when he stubbed his left foot against a piece of furniture.  He noticed bleeding and pain to his left foot and presents to the ER.  He denies injury elsewhere no headache no neck pain no fall.  No recent illnesses no fever no cough no vomiting or diarrhea.      Past Medical History:  Diagnosis Date   Hypertension    Prostate cancer [facility])     Patient Active Problem List   Diagnosis Date Noted   Malignant neoplasm of prostate ([place]) 02/08/2020    Past Surgical History:  Procedure Laterality Date   BACK SURGERY     PROSTATE BIOPSY         Family History  Problem Relation Age of Onset   Breast cancer Neg Hx    Colon cancer Neg Hx    Prostate cancer Neg Hx    Pancreatic cancer Neg Hx     Social History   Tobacco Use   Smoking status: Never   Smokeless tobacco: Never  Vaping Use   Vaping Use: Never used  Substance Use Topics   Alcohol use: Yes    Comment: occasionally   Drug use: No    Home Medications Prior to Admission medications   Medication Sig Start Date End Date Taking? Authorizing Provider  acetaminophen (TYLENOL) 650 MG CR tablet Take 650 mg by mouth every 8 (eight) hours as needed for pain.   Yes [provider]  cephALEXin (KEFLEX) 500 MG capsule Take 1 capsule (500 mg total) by mouth 3 (three) times daily for 7 days. 10/01/20 10/08/20 Yes Luna Fuse, MD  hydrALAZINE (APRESOLINE) 25 MG tablet Take 12.5 mg by mouth 2 (two) times daily. 11/07/19  Yes [provider]  latanoprost (XALATAN) 0.005 % ophthalmic solution Place 1 drop into both eyes at bedtime. 11/18/19  Yes [provider]   Multiple Vitamin (MULITIVITAMIN WITH MINERALS) TABS Take 1 tablet by mouth daily.   Yes [provider]  oxybutynin (DITROPAN) 5 MG tablet Take 5 mg by mouth daily. 09/03/19  Yes [provider]  trandolapril (MAVIK) 2 MG tablet Take 2 mg by mouth daily.   Yes [provider]    Allergies    Patient has no known allergies.  Review of Systems   Review of Systems  Constitutional:  Negative for fever.  HENT:  Negative for ear pain and sore throat.   Eyes:  Negative for pain.  Respiratory:  Negative for cough.   Cardiovascular:  Negative for chest pain.  Gastrointestinal:  Negative for abdominal pain.  Genitourinary:  Negative for flank pain.  Musculoskeletal:  Negative for back pain.  Skin:  Negative for color change and rash.  Neurological:  Negative for syncope.  All other systems reviewed and are negative.  Physical Exam Updated Vital Signs BP (!) 181/90 (BP Location: Left Arm)   Pulse 77   Temp 98.1 F (36.7 C) (Oral)   Resp 13   SpO2 98%   Physical Exam Constitutional:      Appearance: He is well-developed.  HENT:     Head: Normocephalic.  Nose: Nose normal.  Eyes:     Extraocular Movements: Extraocular movements intact.  Cardiovascular:     Rate and Rhythm: Normal rate.  Pulmonary:     Effort: Pulmonary effort is normal.  Musculoskeletal:     Comments: Left foot fourth toe has a partial amputation at the base of the toe.  Skin:    Coloration: Skin is not jaundiced.  Neurological:     Mental Status: He is alert. Mental status is at baseline.       ED Results / Procedures / Treatments   Labs (all labs ordered are listed, but only abnormal results are displayed) Labs Reviewed - No data to display  EKG None  Radiology DG Foot Complete Left  Result Date: 10/01/2020 CLINICAL DATA:  hit foot on furniture, fall EXAM: LEFT FOOT - COMPLETE 3+ VIEW COMPARISON:  None. FINDINGS: Acute non articular fracture to the distal metaphysis  in the proximal phalanx left fourth toe with 7 mm lateral displacement of the distal fracture fragment and mild overriding of the fracture fragments. No additional fracture. No dislocation. Pes planus deformity. No suspicious focal osseous lesions. 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 No radiopaque foreign bodies. IMPRESSION: 1. Acute displaced non articular fracture in the proximal phalanx left fourth toe as detailed. 2. Pes planus deformity. 3. 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 Electronically Signed   By: Ilona Sorrel M.D.   On: 10/01/2020 05:45    Procedures .Marland KitchenLaceration Repair  Date/Time: 10/01/2020 8:04 AM Performed by: Luna Fuse, MD Authorized by: Luna Fuse, MD   Consent:    Consent obtained:  Verbal   Risks discussed:  Infection, retained foreign body, nerve damage, poor wound healing, need for additional repair and poor cosmetic result Comments:     Patient had a laceration in the webspace between his third and fourth toe.  Approximately 2 cm.  This was sutured closed with 3x3-0 Ethilon sutures.  Good approximation of edges.  Wound has been prepped with lidocaine 1% no epinephrine and thoroughly irrigated with sterile saline with drops of Betadine under pressure total of 500 cc.  Patient tolerated procedure well.  Xeroform dressing placed with gauze between the toes and buddy tape applied.  Walking shoe given to the patient.   Medications Ordered in ED Medications  ceFAZolin (ANCEF) IVPB 2g/100 mL premix (2 g Intravenous New Bag/Given 10/01/20 0746)  lidocaine (PF) (XYLOCAINE) 1 % injection 5 mL (5 mLs Infiltration Given by Other 10/01/20 [id number])    ED Course  I have reviewed the triage vital signs and the nursing notes.  Pertinent labs & imaging results that were available during my care of the patient were reviewed by me and considered in my medical decision making (see chart for details).    MDM  Rules/Calculators/A&P                           Patient presents with open fracture of the left foot fourth toe.  Consultation placed to orthopedics.  Recommend outpatient follow up in their office.    Final Clinical Impression(s) / ED Diagnoses Final diagnoses:  Open displaced fracture of phalanx of toe of left foot, unspecified toe, initial encounter    Rx / DC Orders ED Discharge Orders          Ordered    cephALEXin (KEFLEX) 500 MG capsule  3 times daily  10/01/20 [id number]             Luna Fuse, MD 10/01/20 [phone]

## 2020-10-01 NOTE — ED Triage Notes (Signed)
Pt stumbled and hit his baby toe of his left foot on a piece of furniture.  Pt reports he feel to the ground but did not hit his head nor was there any LOC.

## 2020-10-11 ENCOUNTER — Ambulatory Visit (INDEPENDENT_AMBULATORY_CARE_PROVIDER_SITE_OTHER): Payer: Medicare Other | Admitting: Podiatry

## 2020-10-11 ENCOUNTER — Other Ambulatory Visit: Payer: Self-pay

## 2020-10-11 DIAGNOSIS — S92502A Displaced unspecified fracture of left lesser toe(s), initial encounter for closed fracture: Secondary | ICD-10-CM | POA: Diagnosis not present

## 2020-10-12 NOTE — Progress Notes (Signed)
Subjective:  Patient ID: Derek Blackburn, male    DOB: 1935/12/31,  MRN: [id number]  Chief Complaint  Patient presents with   Toe Pain    Left foot toe fracture     85 y.o. male presents with the above complaint. Patient presents with complaint of left fourth digit fracture that is 7 mm displaced.  Patient states that he does not have any pain.  He states he stubbed his toe and has because the total fracture.  He states a little bit of achiness to get 1 to emergency room where he had an x-ray and was referred over here.  He denies any other acute complaints he does not want to get a surgical intervention clinically the fourth toe does not look displaced even though radiographically it does.  Original date of injury was 10/05/2020.   Review of Systems: Negative except as noted in the HPI. Denies N/V/F/Ch.  Past Medical History:  Diagnosis Date   Hypertension    Prostate cancer ([place])     Current Outpatient Medications:    acetaminophen (TYLENOL) 650 MG CR tablet, Take 650 mg by mouth every 8 (eight) hours as needed for pain., Disp: , Rfl:    hydrALAZINE (APRESOLINE) 25 MG tablet, Take 12.5 mg by mouth 2 (two) times daily., Disp: , Rfl:    latanoprost (XALATAN) 0.005 % ophthalmic solution, Place 1 drop into both eyes at bedtime., Disp: , Rfl:    Multiple Vitamin (MULITIVITAMIN WITH MINERALS) TABS, Take 1 tablet by mouth daily., Disp: , Rfl:    oxybutynin (DITROPAN) 5 MG tablet, Take 5 mg by mouth daily., Disp: , Rfl:    trandolapril (MAVIK) 2 MG tablet, Take 2 mg by mouth daily., Disp: , Rfl:   Social History   Tobacco Use  Smoking Status Never  Smokeless Tobacco Never    No Known Allergies Objective:  There were no vitals filed for this visit. There is no height or weight on file to calculate BMI. Constitutional Well developed. Well nourished.  Vascular Dorsalis pedis pulses palpable bilaterally. Posterior tibial pulses palpable bilaterally. Capillary refill normal to all  digits.  No cyanosis or clubbing noted. Pedal hair growth normal.  Neurologic Normal speech. Oriented to person, place, and time. Epicritic sensation to light touch grossly present bilaterally.  Dermatologic Nails well groomed and normal in appearance. No open wounds. No skin lesions.  Orthopedic: No pain on palpation to the left fourth digit.  No pain with range of motion of the fourth digit IPJ or MPJ.  Little bit of tenderness noted.  No extensor or flexor tendinitis noted   Radiographs: 1. Acute displaced non articular fracture in the proximal phalanx left fourth toe as detailed. 2. Pes planus deformity. 3. 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 Assessment:   1. Closed fracture of fourth toe of left foot, initial encounter    Plan:  Patient was evaluated and treated and all questions answered.  Left fourth digit fracture with 7 mm displacement -I explained to the patient the etiology for digit fracture and various treatment options were discussed.  Clinically I am not able to appreciate the displacement however I was able to place her back in a reduced position and buddy splint the toe to the third and second digit.  I encouraged him to continue ambulating in surgical shoe.  I encouraged him to keep the buddy splinting on.  Clinically he is also not having any pain with ambulation therefore I will hold off  on any surgical intervention at this time.  At this time if it causes skin tincture or ulceration or continues to redislocated and cause him pain we can discuss surgical options at that time.  Patient states understanding -Keep wearing the surgical shoe No follow-ups on file.

## 2020-10-13 ENCOUNTER — Telehealth: Payer: Self-pay | Admitting: Podiatry

## 2020-10-13 NOTE — Telephone Encounter (Signed)
Patient called the office wanting to know if he needs to make an appointment to get his stitches removed.

## 2020-10-16 NOTE — Telephone Encounter (Signed)
I called the Patient and left him a voicemail to get him schedule

## 2020-11-10 ENCOUNTER — Ambulatory Visit: Payer: Medicare Other | Admitting: Podiatry

## 2020-11-10 ENCOUNTER — Other Ambulatory Visit: Payer: Self-pay

## 2020-11-10 DIAGNOSIS — S92502A Displaced unspecified fracture of left lesser toe(s), initial encounter for closed fracture: Secondary | ICD-10-CM

## 2020-11-15 NOTE — Progress Notes (Signed)
  Subjective:  Patient ID: Derek Blackburn, male    DOB: February 25, 1935,  MRN: [id number]  Chief Complaint  Patient presents with   Foot Pain    Left foot stitches out     85 y.o. male presents with the above complaint.  Patient presents with follow-up on left fourth digit fracture.  Patient states that he is doing a lot better.  Clinically the fracture looks good.  He does not have any pain he is able to ambulate without any acute issues.  Review of Systems: Negative except as noted in the HPI. Denies N/V/F/Ch.  Past Medical History:  Diagnosis Date   Hypertension    Prostate cancer ([place])     Current Outpatient Medications:    acetaminophen (TYLENOL) 650 MG CR tablet, Take 650 mg by mouth every 8 (eight) hours as needed for pain., Disp: , Rfl:    hydrALAZINE (APRESOLINE) 25 MG tablet, Take 12.5 mg by mouth 2 (two) times daily., Disp: , Rfl:    latanoprost (XALATAN) 0.005 % ophthalmic solution, Place 1 drop into both eyes at bedtime., Disp: , Rfl:    Multiple Vitamin (MULITIVITAMIN WITH MINERALS) TABS, Take 1 tablet by mouth daily., Disp: , Rfl:    oxybutynin (DITROPAN) 5 MG tablet, Take 5 mg by mouth daily., Disp: , Rfl:    trandolapril (MAVIK) 2 MG tablet, Take 2 mg by mouth daily., Disp: , Rfl:   Social History   Tobacco Use  Smoking Status Never  Smokeless Tobacco Never    No Known Allergies Objective:  There were no vitals filed for this visit. There is no height or weight on file to calculate BMI. Constitutional Well developed. Well nourished.  Vascular Dorsalis pedis pulses palpable bilaterally. Posterior tibial pulses palpable bilaterally. Capillary refill normal to all digits.  No cyanosis or clubbing noted. Pedal hair growth normal.  Neurologic Normal speech. Oriented to person, place, and time. Epicritic sensation to light touch grossly present bilaterally.  Dermatologic Nails well groomed and normal in appearance. No open wounds. No skin lesions.   Orthopedic: No pain on palpation to the left fourth digit.  No pain with range of motion of the fourth digit IPJ or MPJ.  No further enderness noted.  No extensor or flexor tendinitis noted   Radiographs: 1. Acute displaced non articular fracture in the proximal phalanx left fourth toe as detailed. 2. Pes planus deformity. 3. 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 Assessment:   1. Closed fracture of fourth toe of left foot, initial encounter     Plan:  Patient was evaluated and treated and all questions answered.  Left fourth digit fracture with 7 mm displacement -Clinically healed and pain has completely resolved.  Good alignment noted clinically.  At this time I discussed with the patient importance of shoe gear modification and transition to regular shoes if any foot and ankle issues arise come back and see me.  He states understanding. No follow-ups on file.

## 2020-11-22 ENCOUNTER — Ambulatory Visit: Payer: Medicare Other | Admitting: Podiatry

## 2021-06-26 ENCOUNTER — Other Ambulatory Visit (HOSPITAL_COMMUNITY): Payer: Self-pay | Admitting: Urology

## 2021-06-26 DIAGNOSIS — C61 Malignant neoplasm of prostate: Secondary | ICD-10-CM

## 2021-07-03 ENCOUNTER — Ambulatory Visit (HOSPITAL_COMMUNITY)
Admission: RE | Admit: 2021-07-03 | Discharge: 2021-07-03 | Disposition: A | Discharge status: Home / Self Care | Payer: Medicare Other | Source: Ambulatory Visit | Attending: Urology | Admitting: Urology

## 2021-07-03 DIAGNOSIS — C61 Malignant neoplasm of prostate: Secondary | ICD-10-CM | POA: Diagnosis present

## 2021-07-03 MED ORDER — PIFLIFOLASTAT F 18 (PYLARIFY) INJECTION
9.0000 | Freq: Once | INTRAVENOUS | Status: AC
  Administered 2021-07-03: 9.9 via INTRAVENOUS

## 2021-07-20 ENCOUNTER — Encounter (HOSPITAL_COMMUNITY): Payer: Self-pay

## 2021-07-20 ENCOUNTER — Emergency Department (HOSPITAL_COMMUNITY)
Admission: EM | Admit: 2021-07-20 | Discharge: 2021-07-20 | Disposition: A | Discharge status: Home / Self Care | Payer: Medicare Other | Attending: Student | Admitting: Student

## 2021-07-20 ENCOUNTER — Emergency Department (HOSPITAL_COMMUNITY): Payer: Medicare Other

## 2021-07-20 ENCOUNTER — Other Ambulatory Visit: Payer: Self-pay

## 2021-07-20 DIAGNOSIS — D649 Anemia, unspecified: Secondary | ICD-10-CM | POA: Diagnosis not present

## 2021-07-20 DIAGNOSIS — N189 Chronic kidney disease, unspecified: Secondary | ICD-10-CM | POA: Insufficient documentation

## 2021-07-20 DIAGNOSIS — Z79899 Other long term (current) drug therapy: Secondary | ICD-10-CM | POA: Diagnosis not present

## 2021-07-20 DIAGNOSIS — Z8546 Personal history of malignant neoplasm of prostate: Secondary | ICD-10-CM | POA: Diagnosis not present

## 2021-07-20 DIAGNOSIS — R778 Other specified abnormalities of plasma proteins: Secondary | ICD-10-CM | POA: Diagnosis not present

## 2021-07-20 DIAGNOSIS — R944 Abnormal results of kidney function studies: Secondary | ICD-10-CM | POA: Diagnosis not present

## 2021-07-20 DIAGNOSIS — R42 Dizziness and giddiness: Secondary | ICD-10-CM | POA: Diagnosis present

## 2021-07-20 DIAGNOSIS — I129 Hypertensive chronic kidney disease with stage 1 through stage 4 chronic kidney disease, or unspecified chronic kidney disease: Secondary | ICD-10-CM | POA: Diagnosis not present

## 2021-07-20 DIAGNOSIS — I160 Hypertensive urgency: Secondary | ICD-10-CM

## 2021-07-20 LAB — BASIC METABOLIC PANEL
Anion gap: 10 (ref 5–15)
BUN: 26 mg/dL — ABNORMAL HIGH (ref 8–23)
CO2: 22 mmol/L (ref 22–32)
Calcium: 9.7 mg/dL (ref 8.9–10.3)
Chloride: 109 mmol/L (ref 98–111)
Creatinine, Ser: 1.88 mg/dL — ABNORMAL HIGH (ref 0.61–1.24)
GFR, Estimated: 35 mL/min — ABNORMAL LOW (ref >60)
Glucose, Bld: 100 mg/dL — ABNORMAL HIGH (ref 70–99)
Potassium: 4.3 mmol/L (ref 3.5–5.1)
Sodium: 141 mmol/L (ref 135–145)

## 2021-07-20 LAB — CBC
HCT: 29.4 % — ABNORMAL LOW (ref 39.0–52.0)
Hemoglobin: 9 g/dL — ABNORMAL LOW (ref 13.0–17.0)
MCH: 28.8 pg (ref 26.0–34.0)
MCHC: 30.6 g/dL (ref 30.0–36.0)
MCV: 94.2 fL (ref 80.0–100.0)
Platelets: 181 10*3/uL (ref 150–400)
RBC: 3.12 MIL/uL — ABNORMAL LOW (ref 4.22–5.81)
RDW: 13.1 % (ref 11.5–15.5)
WBC: 4.9 10*3/uL (ref 4.0–10.5)
nRBC: 0 % (ref 0.0–0.2)

## 2021-07-20 LAB — TROPONIN I (HIGH SENSITIVITY)
Troponin I (High Sensitivity): 63 ng/L — ABNORMAL HIGH (ref <18)
Troponin I (High Sensitivity): 67 ng/L — ABNORMAL HIGH (ref <18)

## 2021-07-20 MED ORDER — AMLODIPINE BESYLATE 5 MG PO TABS: 5.0000 mg | ORAL_TABLET | Freq: Every day | ORAL | 0 refills | Status: DC

## 2021-07-20 MED ORDER — HYDRALAZINE HCL 20 MG/ML IJ SOLN
10.0000 mg | Freq: Once | INTRAMUSCULAR | Status: AC
  Administered 2021-07-20: 10 mg via INTRAVENOUS
  Filled 2021-07-20: qty 1

## 2021-08-13 ENCOUNTER — Other Ambulatory Visit (HOSPITAL_COMMUNITY): Payer: Self-pay | Admitting: *Deleted

## 2021-08-13 DIAGNOSIS — D631 Anemia in chronic kidney disease: Secondary | ICD-10-CM

## 2021-08-14 ENCOUNTER — Encounter (HOSPITAL_COMMUNITY): Payer: Medicare Other

## 2021-08-15 ENCOUNTER — Encounter (HOSPITAL_COMMUNITY)
Admission: RE | Admit: 2021-08-15 | Discharge: 2021-08-15 | Disposition: A | Discharge status: Home / Self Care | Payer: Medicare Other | Source: Ambulatory Visit | Attending: Nephrology | Admitting: Nephrology

## 2021-08-15 DIAGNOSIS — D631 Anemia in chronic kidney disease: Secondary | ICD-10-CM | POA: Insufficient documentation

## 2021-08-15 DIAGNOSIS — N1832 Chronic kidney disease, stage 3b: Secondary | ICD-10-CM | POA: Insufficient documentation

## 2021-08-15 LAB — POCT HEMOGLOBIN-HEMACUE: Hemoglobin: 8.4 g/dL — ABNORMAL LOW (ref 13.0–17.0)

## 2021-08-15 MED ORDER — DARBEPOETIN ALFA 40 MCG/0.4ML IJ SOSY
40.0000 ug | PREFILLED_SYRINGE | INTRAMUSCULAR | Status: DC
  Administered 2021-08-15: 40 ug via SUBCUTANEOUS

## 2021-08-27 ENCOUNTER — Other Ambulatory Visit (HOSPITAL_COMMUNITY): Payer: Self-pay | Admitting: *Deleted

## 2021-08-27 DIAGNOSIS — N1832 Chronic kidney disease, stage 3b: Secondary | ICD-10-CM

## 2021-08-29 ENCOUNTER — Encounter (HOSPITAL_COMMUNITY)
Admission: RE | Admit: 2021-08-29 | Discharge: 2021-08-29 | Disposition: A | Discharge status: Home / Self Care | Payer: Medicare Other | Source: Ambulatory Visit | Attending: Nephrology | Admitting: Nephrology

## 2021-08-29 DIAGNOSIS — Z862 Personal history of diseases of the blood and blood-forming organs and certain disorders involving the immune mechanism: Secondary | ICD-10-CM | POA: Insufficient documentation

## 2021-08-29 DIAGNOSIS — N189 Chronic kidney disease, unspecified: Secondary | ICD-10-CM | POA: Diagnosis present

## 2021-08-29 DIAGNOSIS — D631 Anemia in chronic kidney disease: Secondary | ICD-10-CM | POA: Insufficient documentation

## 2021-08-29 DIAGNOSIS — N1832 Chronic kidney disease, stage 3b: Secondary | ICD-10-CM | POA: Insufficient documentation

## 2021-08-29 LAB — POCT HEMOGLOBIN-HEMACUE: Hemoglobin: 8.9 g/dL — ABNORMAL LOW (ref 13.0–17.0)

## 2021-08-29 MED ORDER — DARBEPOETIN ALFA 40 MCG/0.4ML IJ SOSY
PREFILLED_SYRINGE | INTRAMUSCULAR | Status: AC
  Filled 2021-08-29: qty 0.4

## 2021-08-29 MED ORDER — DARBEPOETIN ALFA 40 MCG/0.4ML IJ SOSY
40.0000 ug | PREFILLED_SYRINGE | INTRAMUSCULAR | Status: DC
  Administered 2021-08-29: 40 ug via SUBCUTANEOUS

## 2021-09-11 ENCOUNTER — Other Ambulatory Visit (HOSPITAL_COMMUNITY): Payer: Self-pay | Admitting: *Deleted

## 2021-09-11 DIAGNOSIS — D631 Anemia in chronic kidney disease: Secondary | ICD-10-CM

## 2021-09-12 ENCOUNTER — Encounter (HOSPITAL_COMMUNITY)
Admission: RE | Admit: 2021-09-12 | Discharge: 2021-09-12 | Disposition: A | Discharge status: Home / Self Care | Payer: Medicare Other | Source: Ambulatory Visit | Attending: Nephrology | Admitting: Nephrology

## 2021-09-12 DIAGNOSIS — N1832 Chronic kidney disease, stage 3b: Secondary | ICD-10-CM | POA: Diagnosis not present

## 2021-09-12 DIAGNOSIS — D631 Anemia in chronic kidney disease: Secondary | ICD-10-CM

## 2021-09-12 LAB — IRON AND TIBC
Iron: 57 ug/dL (ref 45–182)
Saturation Ratios: 22 % (ref 17.9–39.5)
TIBC: 262 ug/dL (ref 250–450)
UIBC: 205 ug/dL

## 2021-09-12 LAB — POCT HEMOGLOBIN-HEMACUE: Hemoglobin: 9 g/dL — ABNORMAL LOW (ref 13.0–17.0)

## 2021-09-12 LAB — FERRITIN: Ferritin: 448 ng/mL — ABNORMAL HIGH (ref 24–336)

## 2021-09-12 MED ORDER — DARBEPOETIN ALFA 40 MCG/0.4ML IJ SOSY
PREFILLED_SYRINGE | INTRAMUSCULAR | Status: AC
  Filled 2021-09-12: qty 0.4

## 2021-09-12 MED ORDER — DARBEPOETIN ALFA 40 MCG/0.4ML IJ SOSY
40.0000 ug | PREFILLED_SYRINGE | INTRAMUSCULAR | Status: DC
  Administered 2021-09-12: 40 ug via SUBCUTANEOUS

## 2021-09-25 ENCOUNTER — Other Ambulatory Visit (HOSPITAL_COMMUNITY): Payer: Self-pay | Admitting: *Deleted

## 2021-09-25 ENCOUNTER — Encounter: Payer: Self-pay | Admitting: Nephrology

## 2021-09-25 DIAGNOSIS — D631 Anemia in chronic kidney disease: Secondary | ICD-10-CM

## 2021-09-25 DIAGNOSIS — N189 Chronic kidney disease, unspecified: Secondary | ICD-10-CM | POA: Insufficient documentation

## 2021-09-26 ENCOUNTER — Encounter (HOSPITAL_COMMUNITY)
Admission: RE | Admit: 2021-09-26 | Discharge: 2021-09-26 | Disposition: A | Discharge status: Home / Self Care | Payer: Medicare Other | Source: Ambulatory Visit | Attending: Nephrology | Admitting: Nephrology

## 2021-09-26 VITALS — BP 166/95 | HR 72 | Temp 98.1°F

## 2021-09-26 DIAGNOSIS — D631 Anemia in chronic kidney disease: Secondary | ICD-10-CM

## 2021-09-26 DIAGNOSIS — N189 Chronic kidney disease, unspecified: Secondary | ICD-10-CM

## 2021-09-26 DIAGNOSIS — N1832 Chronic kidney disease, stage 3b: Secondary | ICD-10-CM | POA: Diagnosis not present

## 2021-09-26 DIAGNOSIS — Z862 Personal history of diseases of the blood and blood-forming organs and certain disorders involving the immune mechanism: Secondary | ICD-10-CM

## 2021-09-26 MED ORDER — DARBEPOETIN ALFA 40 MCG/0.4ML IJ SOSY
40.0000 ug | PREFILLED_SYRINGE | INTRAMUSCULAR | Status: DC
  Administered 2021-09-26: 40 ug via SUBCUTANEOUS

## 2021-09-26 MED ORDER — DARBEPOETIN ALFA 40 MCG/0.4ML IJ SOSY
PREFILLED_SYRINGE | INTRAMUSCULAR | Status: AC
  Filled 2021-09-26: qty 0.4

## 2021-10-10 ENCOUNTER — Encounter (HOSPITAL_COMMUNITY): Payer: Medicare Other

## 2021-10-11 ENCOUNTER — Encounter (HOSPITAL_COMMUNITY)
Admission: RE | Admit: 2021-10-11 | Discharge: 2021-10-11 | Disposition: A | Discharge status: Home / Self Care | Payer: Medicare Other | Source: Ambulatory Visit | Attending: Nephrology | Admitting: Nephrology

## 2021-10-11 VITALS — BP 137/81 | HR 80 | Temp 97.6°F

## 2021-10-11 DIAGNOSIS — Z862 Personal history of diseases of the blood and blood-forming organs and certain disorders involving the immune mechanism: Secondary | ICD-10-CM | POA: Insufficient documentation

## 2021-10-11 DIAGNOSIS — N189 Chronic kidney disease, unspecified: Secondary | ICD-10-CM | POA: Insufficient documentation

## 2021-10-11 LAB — IRON AND TIBC
Iron: 96 ug/dL (ref 45–182)
Saturation Ratios: 36 % (ref 17.9–39.5)
TIBC: 270 ug/dL (ref 250–450)
UIBC: 174 ug/dL

## 2021-10-11 LAB — FERRITIN: Ferritin: 541 ng/mL — ABNORMAL HIGH (ref 24–336)

## 2021-10-11 LAB — POCT HEMOGLOBIN-HEMACUE: Hemoglobin: 11.1 g/dL — ABNORMAL LOW (ref 13.0–17.0)

## 2021-10-11 MED ORDER — DARBEPOETIN ALFA 40 MCG/0.4ML IJ SOSY
40.0000 ug | PREFILLED_SYRINGE | INTRAMUSCULAR | Status: DC
  Administered 2021-10-11: 40 ug via SUBCUTANEOUS

## 2021-10-11 MED ORDER — DARBEPOETIN ALFA 40 MCG/0.4ML IJ SOSY
PREFILLED_SYRINGE | INTRAMUSCULAR | Status: AC
  Filled 2021-10-11: qty 0.4

## 2021-10-12 LAB — POCT HEMOGLOBIN-HEMACUE: Hemoglobin: 10.9 g/dL — ABNORMAL LOW (ref 13.0–17.0)

## 2021-10-25 ENCOUNTER — Encounter (HOSPITAL_COMMUNITY)
Admission: RE | Admit: 2021-10-25 | Discharge: 2021-10-25 | Disposition: A | Discharge status: Home / Self Care | Payer: Medicare Other | Source: Ambulatory Visit | Attending: Nephrology | Admitting: Nephrology

## 2021-10-25 VITALS — BP 139/92 | HR 97 | Temp 97.5°F

## 2021-10-25 DIAGNOSIS — N189 Chronic kidney disease, unspecified: Secondary | ICD-10-CM

## 2021-10-25 LAB — POCT HEMOGLOBIN-HEMACUE: Hemoglobin: 10.2 g/dL — ABNORMAL LOW (ref 13.0–17.0)

## 2021-10-25 MED ORDER — DARBEPOETIN ALFA 40 MCG/0.4ML IJ SOSY
40.0000 ug | PREFILLED_SYRINGE | INTRAMUSCULAR | Status: DC
  Administered 2021-10-25: 40 ug via SUBCUTANEOUS

## 2021-10-25 MED ORDER — DARBEPOETIN ALFA 40 MCG/0.4ML IJ SOSY
PREFILLED_SYRINGE | INTRAMUSCULAR | Status: AC
  Filled 2021-10-25: qty 0.4

## 2021-11-07 DIAGNOSIS — D631 Anemia in chronic kidney disease: Secondary | ICD-10-CM | POA: Diagnosis present

## 2021-11-07 DIAGNOSIS — D649 Anemia, unspecified: Secondary | ICD-10-CM | POA: Diagnosis present

## 2021-11-07 DIAGNOSIS — R011 Cardiac murmur, unspecified: Secondary | ICD-10-CM | POA: Insufficient documentation

## 2021-11-07 DIAGNOSIS — E785 Hyperlipidemia, unspecified: Secondary | ICD-10-CM | POA: Diagnosis present

## 2021-11-07 HISTORY — DX: Hyperlipidemia, unspecified: E78.5

## 2021-11-07 HISTORY — DX: Anemia in chronic kidney disease: D63.1

## 2021-11-08 ENCOUNTER — Encounter (HOSPITAL_COMMUNITY)
Admission: RE | Admit: 2021-11-08 | Discharge: 2021-11-08 | Disposition: A | Discharge status: Home / Self Care | Payer: Medicare Other | Source: Ambulatory Visit | Attending: Nephrology | Admitting: Nephrology

## 2021-11-08 VITALS — BP 167/88 | HR 73 | Temp 97.4°F | Resp 18

## 2021-11-08 DIAGNOSIS — N189 Chronic kidney disease, unspecified: Secondary | ICD-10-CM | POA: Insufficient documentation

## 2021-11-08 DIAGNOSIS — Z862 Personal history of diseases of the blood and blood-forming organs and certain disorders involving the immune mechanism: Secondary | ICD-10-CM | POA: Insufficient documentation

## 2021-11-08 LAB — POCT HEMOGLOBIN-HEMACUE: Hemoglobin: 9.4 g/dL — ABNORMAL LOW (ref 13.0–17.0)

## 2021-11-08 LAB — IRON AND TIBC
Iron: 60 ug/dL (ref 45–182)
Saturation Ratios: 25 % (ref 17.9–39.5)
TIBC: 241 ug/dL — ABNORMAL LOW (ref 250–450)
UIBC: 181 ug/dL

## 2021-11-08 LAB — FERRITIN: Ferritin: 460 ng/mL — ABNORMAL HIGH (ref 24–336)

## 2021-11-08 MED ORDER — DARBEPOETIN ALFA 40 MCG/0.4ML IJ SOSY: 40.0000 ug | PREFILLED_SYRINGE | INTRAMUSCULAR | Status: DC

## 2021-11-08 MED ORDER — DARBEPOETIN ALFA 40 MCG/0.4ML IJ SOSY
PREFILLED_SYRINGE | INTRAMUSCULAR | Status: AC
  Administered 2021-11-08: 40 ug via SUBCUTANEOUS
  Filled 2021-11-08: qty 0.4

## 2021-11-22 ENCOUNTER — Encounter (HOSPITAL_COMMUNITY)
Admission: RE | Admit: 2021-11-22 | Discharge: 2021-11-22 | Disposition: A | Discharge status: Home / Self Care | Payer: Medicare Other | Source: Ambulatory Visit | Attending: Nephrology | Admitting: Nephrology

## 2021-11-22 VITALS — BP 161/86 | HR 69 | Temp 97.5°F

## 2021-11-22 DIAGNOSIS — N189 Chronic kidney disease, unspecified: Secondary | ICD-10-CM | POA: Diagnosis not present

## 2021-11-22 MED ORDER — DARBEPOETIN ALFA 40 MCG/0.4ML IJ SOSY
PREFILLED_SYRINGE | INTRAMUSCULAR | Status: AC
  Administered 2021-11-22: 40 ug
  Filled 2021-11-22: qty 0.4

## 2021-11-22 MED ORDER — DARBEPOETIN ALFA 40 MCG/0.4ML IJ SOSY: 40.0000 ug | PREFILLED_SYRINGE | INTRAMUSCULAR | Status: DC

## 2021-11-23 LAB — POCT HEMOGLOBIN-HEMACUE: Hemoglobin: 9.8 g/dL — ABNORMAL LOW (ref 13.0–17.0)

## 2021-12-04 NOTE — Progress Notes (Unsigned)
No chief complaint on file.  History of Present Illness: 86 yo male with history of anemia, CKD, HTN, prostate cancer and PAD who is here today as a new consult, referred by ***, for the evaluation of a cardiac murmur.   Primary Care Physician: Suella Broad, FNP   Past Medical History:  Diagnosis Date   Anemia    CKD (chronic kidney disease)    Heart murmur    Hypertension    Hypertensive urgency    Peripheral polyneuropathy    Prostate cancer ([place])    PVD (peripheral vascular disease) ([place])     Past Surgical History:  Procedure Laterality Date   BACK SURGERY     PROSTATE BIOPSY      Current Outpatient Medications  Medication Sig Dispense Refill   acetaminophen (TYLENOL) 650 MG CR tablet Take 650 mg by mouth every 8 (eight) hours as needed for pain.     amLODipine (NORVASC) 5 MG tablet Take 1 tablet (5 mg total) by mouth daily. 30 tablet 0   hydrALAZINE (APRESOLINE) 25 MG tablet Take 12.5 mg by mouth 2 (two) times daily.     latanoprost (XALATAN) 0.005 % ophthalmic solution Place 1 drop into both eyes at bedtime.     Multiple Vitamin (MULITIVITAMIN WITH MINERALS) TABS Take 1 tablet by mouth daily.     oxybutynin (DITROPAN) 5 MG tablet Take 5 mg by mouth daily.     trandolapril (MAVIK) 2 MG tablet Take 2 mg by mouth daily.     No current facility-administered medications for this visit.    No Known Allergies  Social History   Socioeconomic History   Marital status: Married    Spouse name: Not on file   Number of children: 3   Years of education: Not on file   Highest education level: Not on file  Occupational History    Comment: retired  Tobacco Use   Smoking status: Never   Smokeless tobacco: Never  Vaping Use   Vaping Use: Never used  Substance and Sexual Activity   Alcohol use: Yes    Comment: occasionally   Drug use: No   Sexual activity: Yes  Other Topics Concern   Not on file  Social History Narrative   2 sons, 1 daughter   Social  Determinants of Health   Financial Resource Strain: Not on file  Food Insecurity: Not on file  Transportation Needs: Not on file  Physical Activity: Not on file  Stress: Not on file  Social Connections: Not on file  Intimate Partner Violence: Not on file    Family History  Problem Relation Age of Onset   Breast cancer Neg Hx    Colon cancer Neg Hx    Prostate cancer Neg Hx    Pancreatic cancer Neg Hx     Review of Systems:  As stated in the HPI and otherwise negative.   There were no vitals taken for this visit.  Physical Examination: General: Well developed, well nourished, NAD  HEENT: OP clear, mucus membranes moist  SKIN: warm, dry. No rashes. Neuro: No focal deficits  Musculoskeletal: Muscle strength 5/5 all ext  Psychiatric: Mood and affect normal  Neck: No JVD, no carotid bruits, no thyromegaly, no lymphadenopathy.  Lungs:Clear bilaterally, no wheezes, rhonci, crackles Cardiovascular: Regular rate and rhythm. No murmurs, gallops or rubs. Abdomen:Soft. Bowel sounds present. Non-tender.  Extremities: No lower extremity edema. Pulses are 2 + in the bilateral DP/PT.  EKG:  EKG {ACTION; IS/IS [id number]}  ordered today. The ekg ordered today demonstrates ***  Recent Labs: 07/20/2021: BUN 26; Creatinine, Ser 1.88; Platelets 181; Potassium 4.3; Sodium 141 11/22/2021: Hemoglobin 9.8   Lipid Panel No results found for: "CHOL", "TRIG", "HDL", "CHOLHDL", "VLDL", "LDLCALC", "LDLDIRECT"   Wt Readings from Last 3 Encounters:  07/20/21 169 lb 15.6 oz (77.1 kg)  02/08/20 170 lb (77.1 kg)  01/21/11 172 lb (78 kg)    Assessment and Plan:   1.   Labs/ tests ordered today include:  No orders of the defined types were placed in this encounter.  Disposition:   F/U with me in ***   Signed, Lauree Chandler, MD, [facility] 12/04/2021 4:17 PM    [facility] [address], [place], [place]  [address] Phone: [phone]; Fax: [phone]

## 2021-12-05 ENCOUNTER — Ambulatory Visit: Payer: Medicare Other | Attending: Cardiovascular Disease | Admitting: Cardiovascular Disease

## 2021-12-05 ENCOUNTER — Encounter: Payer: Self-pay | Admitting: Cardiovascular Disease

## 2021-12-05 VITALS — BP 156/86 | HR 74 | Ht 70.0 in | Wt 152.6 lb

## 2021-12-05 DIAGNOSIS — R011 Cardiac murmur, unspecified: Secondary | ICD-10-CM | POA: Diagnosis not present

## 2021-12-05 NOTE — Patient Instructions (Signed)
Medication Instructions:  No changes today *If you need a refill on your cardiac medications before your next appointment, please call your pharmacy*   Lab Work: none  Testing/Procedures: Your physician has requested that you have an echocardiogram. Echocardiography is a painless test that uses sound waves to create images of your heart. It provides your doctor with information about the size and shape of your heart and how well your heart's chambers and valves are working. This procedure takes approximately one hour. There are no restrictions for this procedure. Please do NOT wear cologne, perfume, aftershave, or lotions (deodorant is allowed). Please arrive 15 minutes prior to your appointment time.    Follow-Up: At [facility], you and your health needs are our priority.  As part of our continuing mission to provide you with exceptional heart care, we have created designated Provider Care Teams.  These Care Teams include your primary Cardiologist (physician) and Advanced Practice Providers (APPs -  Physician Assistants and Nurse Practitioners) who all work together to provide you with the care you need, when you need it.   Your next appointment:   6 month(s)  The format for your next appointment:   In Person  Provider:   Lauree Chandler, MD   Important Information About Sugar

## 2021-12-06 ENCOUNTER — Encounter (HOSPITAL_COMMUNITY)
Admission: RE | Admit: 2021-12-06 | Discharge: 2021-12-06 | Disposition: A | Discharge status: Home / Self Care | Payer: Medicare Other | Source: Ambulatory Visit | Attending: Nephrology | Admitting: Nephrology

## 2021-12-06 VITALS — BP 151/90 | HR 78 | Resp 18

## 2021-12-06 DIAGNOSIS — N189 Chronic kidney disease, unspecified: Secondary | ICD-10-CM | POA: Insufficient documentation

## 2021-12-06 DIAGNOSIS — Z862 Personal history of diseases of the blood and blood-forming organs and certain disorders involving the immune mechanism: Secondary | ICD-10-CM | POA: Insufficient documentation

## 2021-12-06 LAB — IRON AND TIBC
Iron: 98 ug/dL (ref 45–182)
Saturation Ratios: 37 % (ref 17.9–39.5)
TIBC: 267 ug/dL (ref 250–450)
UIBC: 169 ug/dL

## 2021-12-06 LAB — FERRITIN: Ferritin: 489 ng/mL — ABNORMAL HIGH (ref 24–336)

## 2021-12-06 LAB — POCT HEMOGLOBIN-HEMACUE: Hemoglobin: 9.5 g/dL — ABNORMAL LOW (ref 13.0–17.0)

## 2021-12-06 MED ORDER — DARBEPOETIN ALFA 40 MCG/0.4ML IJ SOSY
40.0000 ug | PREFILLED_SYRINGE | INTRAMUSCULAR | Status: DC
  Administered 2021-12-06: 40 ug via SUBCUTANEOUS

## 2021-12-06 MED ORDER — DARBEPOETIN ALFA 40 MCG/0.4ML IJ SOSY
PREFILLED_SYRINGE | INTRAMUSCULAR | Status: AC
  Filled 2021-12-06: qty 0.4

## 2021-12-19 ENCOUNTER — Encounter (HOSPITAL_COMMUNITY)
Admission: RE | Admit: 2021-12-19 | Discharge: 2021-12-19 | Disposition: A | Discharge status: Home / Self Care | Payer: Medicare Other | Source: Ambulatory Visit | Attending: Nephrology | Admitting: Nephrology

## 2021-12-19 VITALS — BP 173/103 | HR 87 | Temp 97.1°F | Resp 18

## 2021-12-19 DIAGNOSIS — N189 Chronic kidney disease, unspecified: Secondary | ICD-10-CM | POA: Diagnosis not present

## 2021-12-19 LAB — POCT HEMOGLOBIN-HEMACUE: Hemoglobin: 9.9 g/dL — ABNORMAL LOW (ref 13.0–17.0)

## 2021-12-19 MED ORDER — DARBEPOETIN ALFA 40 MCG/0.4ML IJ SOSY: 40.0000 ug | PREFILLED_SYRINGE | INTRAMUSCULAR | Status: DC

## 2021-12-19 MED ORDER — DARBEPOETIN ALFA 40 MCG/0.4ML IJ SOSY
PREFILLED_SYRINGE | INTRAMUSCULAR | Status: AC
  Administered 2021-12-19: 40 ug via SUBCUTANEOUS
  Filled 2021-12-19: qty 0.4

## 2022-01-02 ENCOUNTER — Ambulatory Visit (HOSPITAL_COMMUNITY): Payer: Medicare Other | Attending: Cardiovascular Disease

## 2022-01-02 ENCOUNTER — Encounter (HOSPITAL_COMMUNITY)
Admission: RE | Admit: 2022-01-02 | Discharge: 2022-01-02 | Disposition: A | Discharge status: Home / Self Care | Payer: Medicare Other | Source: Ambulatory Visit | Attending: Nephrology | Admitting: Nephrology

## 2022-01-02 VITALS — BP 149/86 | HR 71 | Temp 97.1°F | Resp 17

## 2022-01-02 DIAGNOSIS — R011 Cardiac murmur, unspecified: Secondary | ICD-10-CM | POA: Insufficient documentation

## 2022-01-02 DIAGNOSIS — N189 Chronic kidney disease, unspecified: Secondary | ICD-10-CM | POA: Diagnosis present

## 2022-01-02 DIAGNOSIS — Z862 Personal history of diseases of the blood and blood-forming organs and certain disorders involving the immune mechanism: Secondary | ICD-10-CM | POA: Insufficient documentation

## 2022-01-02 LAB — ECHOCARDIOGRAM COMPLETE
AR max vel: 1.68 cm2
AV Area VTI: 2 cm2
AV Area mean vel: 1.6 cm2
AV Mean grad: 16 mmHg
AV Peak grad: 25 mmHg
Ao pk vel: 2.5 m/s
Area-P 1/2: 3.1 cm2
S' Lateral: 2.6 cm

## 2022-01-02 LAB — IRON AND TIBC
Iron: 76 ug/dL (ref 45–182)
Saturation Ratios: 31 % (ref 17.9–39.5)
TIBC: 245 ug/dL — ABNORMAL LOW (ref 250–450)
UIBC: 169 ug/dL

## 2022-01-02 LAB — POCT HEMOGLOBIN-HEMACUE: Hemoglobin: 9.5 g/dL — ABNORMAL LOW (ref 13.0–17.0)

## 2022-01-02 LAB — FERRITIN: Ferritin: 420 ng/mL — ABNORMAL HIGH (ref 24–336)

## 2022-01-02 MED ORDER — DARBEPOETIN ALFA 40 MCG/0.4ML IJ SOSY
40.0000 ug | PREFILLED_SYRINGE | INTRAMUSCULAR | Status: DC
  Administered 2022-01-02: 40 ug via SUBCUTANEOUS

## 2022-01-02 MED ORDER — DARBEPOETIN ALFA 40 MCG/0.4ML IJ SOSY
PREFILLED_SYRINGE | INTRAMUSCULAR | Status: AC
  Filled 2022-01-02: qty 0.4

## 2022-01-10 ENCOUNTER — Telehealth: Payer: Self-pay | Admitting: Cardiovascular Disease

## 2022-01-10 NOTE — Telephone Encounter (Signed)
Patient is returning RN's call for Echo results. Please advise.

## 2022-01-10 NOTE — Telephone Encounter (Signed)
Reviewed echo results with patient.  

## 2022-01-16 ENCOUNTER — Encounter (HOSPITAL_COMMUNITY)
Admission: RE | Admit: 2022-01-16 | Discharge: 2022-01-16 | Disposition: A | Discharge status: Home / Self Care | Payer: Medicare Other | Source: Ambulatory Visit | Attending: Nephrology | Admitting: Nephrology

## 2022-01-16 VITALS — BP 160/100 | HR 84 | Temp 97.5°F | Resp 18

## 2022-01-16 DIAGNOSIS — N189 Chronic kidney disease, unspecified: Secondary | ICD-10-CM

## 2022-01-16 LAB — POCT HEMOGLOBIN-HEMACUE: Hemoglobin: 10 g/dL — ABNORMAL LOW (ref 13.0–17.0)

## 2022-01-16 MED ORDER — DARBEPOETIN ALFA 40 MCG/0.4ML IJ SOSY
PREFILLED_SYRINGE | INTRAMUSCULAR | Status: AC
  Administered 2022-01-16: 40 ug via SUBCUTANEOUS
  Filled 2022-01-16: qty 0.4

## 2022-01-16 MED ORDER — DARBEPOETIN ALFA 40 MCG/0.4ML IJ SOSY: 40.0000 ug | PREFILLED_SYRINGE | INTRAMUSCULAR | Status: DC

## 2022-01-30 ENCOUNTER — Encounter (HOSPITAL_COMMUNITY)
Admission: RE | Admit: 2022-01-30 | Discharge: 2022-01-30 | Disposition: A | Discharge status: Home / Self Care | Payer: Medicare Other | Source: Ambulatory Visit | Attending: Nephrology | Admitting: Nephrology

## 2022-01-30 VITALS — BP 149/94 | HR 85 | Temp 97.4°F | Resp 17

## 2022-01-30 DIAGNOSIS — Z862 Personal history of diseases of the blood and blood-forming organs and certain disorders involving the immune mechanism: Secondary | ICD-10-CM | POA: Insufficient documentation

## 2022-01-30 DIAGNOSIS — N189 Chronic kidney disease, unspecified: Secondary | ICD-10-CM | POA: Insufficient documentation

## 2022-01-30 LAB — IRON AND TIBC
Iron: 85 ug/dL (ref 45–182)
Saturation Ratios: 35 % (ref 17.9–39.5)
TIBC: 241 ug/dL — ABNORMAL LOW (ref 250–450)
UIBC: 156 ug/dL

## 2022-01-30 LAB — FERRITIN: Ferritin: 332 ng/mL (ref 24–336)

## 2022-01-30 LAB — POCT HEMOGLOBIN-HEMACUE: Hemoglobin: 10.7 g/dL — ABNORMAL LOW (ref 13.0–17.0)

## 2022-01-30 MED ORDER — DARBEPOETIN ALFA 40 MCG/0.4ML IJ SOSY
40.0000 ug | PREFILLED_SYRINGE | INTRAMUSCULAR | Status: DC
  Administered 2022-01-30: 40 ug via SUBCUTANEOUS

## 2022-01-30 MED ORDER — DARBEPOETIN ALFA 40 MCG/0.4ML IJ SOSY
PREFILLED_SYRINGE | INTRAMUSCULAR | Status: AC
  Filled 2022-01-30: qty 0.4

## 2022-02-07 ENCOUNTER — Other Ambulatory Visit: Payer: Self-pay | Admitting: Urology

## 2022-02-07 DIAGNOSIS — C7951 Secondary malignant neoplasm of bone: Secondary | ICD-10-CM

## 2022-02-07 DIAGNOSIS — C61 Malignant neoplasm of prostate: Secondary | ICD-10-CM

## 2022-02-13 ENCOUNTER — Encounter (HOSPITAL_COMMUNITY)
Admission: RE | Admit: 2022-02-13 | Discharge: 2022-02-13 | Disposition: A | Discharge status: Home / Self Care | Payer: Medicare Other | Source: Ambulatory Visit | Attending: Nephrology | Admitting: Nephrology

## 2022-02-13 VITALS — BP 183/98 | HR 75 | Temp 97.2°F | Resp 18

## 2022-02-13 DIAGNOSIS — Z862 Personal history of diseases of the blood and blood-forming organs and certain disorders involving the immune mechanism: Secondary | ICD-10-CM

## 2022-02-13 DIAGNOSIS — N189 Chronic kidney disease, unspecified: Secondary | ICD-10-CM | POA: Diagnosis not present

## 2022-02-13 LAB — POCT HEMOGLOBIN-HEMACUE: Hemoglobin: 10.7 g/dL — ABNORMAL LOW (ref 13.0–17.0)

## 2022-02-13 MED ORDER — DARBEPOETIN ALFA 40 MCG/0.4ML IJ SOSY
40.0000 ug | PREFILLED_SYRINGE | INTRAMUSCULAR | Status: DC
  Administered 2022-02-13: 40 ug via SUBCUTANEOUS

## 2022-02-13 MED ORDER — DARBEPOETIN ALFA 40 MCG/0.4ML IJ SOSY
PREFILLED_SYRINGE | INTRAMUSCULAR | Status: AC
  Filled 2022-02-13: qty 0.4

## 2022-02-27 ENCOUNTER — Encounter (HOSPITAL_COMMUNITY)
Admission: RE | Admit: 2022-02-27 | Discharge: 2022-02-27 | Disposition: A | Discharge status: Home / Self Care | Payer: Medicare Other | Source: Ambulatory Visit | Attending: Nephrology | Admitting: Nephrology

## 2022-02-27 VITALS — BP 182/98 | HR 77 | Temp 97.6°F | Resp 17

## 2022-02-27 DIAGNOSIS — Z862 Personal history of diseases of the blood and blood-forming organs and certain disorders involving the immune mechanism: Secondary | ICD-10-CM

## 2022-02-27 DIAGNOSIS — N189 Chronic kidney disease, unspecified: Secondary | ICD-10-CM | POA: Diagnosis not present

## 2022-02-27 LAB — POCT HEMOGLOBIN-HEMACUE: Hemoglobin: 10.2 g/dL — ABNORMAL LOW (ref 13.0–17.0)

## 2022-02-27 MED ORDER — DARBEPOETIN ALFA 40 MCG/0.4ML IJ SOSY
PREFILLED_SYRINGE | INTRAMUSCULAR | Status: AC
  Filled 2022-02-27: qty 0.4

## 2022-02-27 MED ORDER — DARBEPOETIN ALFA 40 MCG/0.4ML IJ SOSY
40.0000 ug | PREFILLED_SYRINGE | INTRAMUSCULAR | Status: DC
  Administered 2022-02-27: 40 ug via SUBCUTANEOUS

## 2022-02-28 ENCOUNTER — Other Ambulatory Visit: Payer: Medicare Other

## 2022-03-01 ENCOUNTER — Ambulatory Visit
Admission: RE | Admit: 2022-03-01 | Discharge: 2022-03-01 | Disposition: A | Discharge status: Home / Self Care | Payer: Medicare Other | Source: Ambulatory Visit | Attending: Urology | Admitting: Urology

## 2022-03-01 DIAGNOSIS — C61 Malignant neoplasm of prostate: Secondary | ICD-10-CM

## 2022-03-01 DIAGNOSIS — C7951 Secondary malignant neoplasm of bone: Secondary | ICD-10-CM

## 2022-03-13 ENCOUNTER — Encounter (HOSPITAL_COMMUNITY)
Admission: RE | Admit: 2022-03-13 | Discharge: 2022-03-13 | Disposition: A | Discharge status: Home / Self Care | Payer: Medicare Other | Source: Ambulatory Visit | Attending: Nephrology | Admitting: Nephrology

## 2022-03-13 VITALS — BP 146/89 | HR 82 | Temp 97.3°F | Resp 18

## 2022-03-13 DIAGNOSIS — Z862 Personal history of diseases of the blood and blood-forming organs and certain disorders involving the immune mechanism: Secondary | ICD-10-CM | POA: Insufficient documentation

## 2022-03-13 DIAGNOSIS — N189 Chronic kidney disease, unspecified: Secondary | ICD-10-CM | POA: Diagnosis present

## 2022-03-13 LAB — FERRITIN: Ferritin: 289 ng/mL (ref 24–336)

## 2022-03-13 LAB — POCT HEMOGLOBIN-HEMACUE: Hemoglobin: 10.6 g/dL — ABNORMAL LOW (ref 13.0–17.0)

## 2022-03-13 LAB — IRON AND TIBC
Iron: 83 ug/dL (ref 45–182)
Saturation Ratios: 35 % (ref 17.9–39.5)
TIBC: 237 ug/dL — ABNORMAL LOW (ref 250–450)
UIBC: 154 ug/dL

## 2022-03-13 MED ORDER — DARBEPOETIN ALFA 40 MCG/0.4ML IJ SOSY
PREFILLED_SYRINGE | INTRAMUSCULAR | Status: AC
  Filled 2022-03-13: qty 0.4

## 2022-03-13 MED ORDER — DARBEPOETIN ALFA 40 MCG/0.4ML IJ SOSY
40.0000 ug | PREFILLED_SYRINGE | INTRAMUSCULAR | Status: DC
  Administered 2022-03-13: 40 ug via SUBCUTANEOUS

## 2022-03-27 ENCOUNTER — Encounter (HOSPITAL_COMMUNITY)
Admission: RE | Admit: 2022-03-27 | Discharge: 2022-03-27 | Disposition: A | Discharge status: Home / Self Care | Payer: Medicare Other | Source: Ambulatory Visit | Attending: Nephrology | Admitting: Nephrology

## 2022-03-27 VITALS — BP 156/90 | HR 79 | Temp 97.2°F | Resp 19

## 2022-03-27 DIAGNOSIS — N189 Chronic kidney disease, unspecified: Secondary | ICD-10-CM | POA: Diagnosis not present

## 2022-03-27 DIAGNOSIS — Z862 Personal history of diseases of the blood and blood-forming organs and certain disorders involving the immune mechanism: Secondary | ICD-10-CM

## 2022-03-27 LAB — POCT HEMOGLOBIN-HEMACUE: Hemoglobin: 10.6 g/dL — ABNORMAL LOW (ref 13.0–17.0)

## 2022-03-27 MED ORDER — DARBEPOETIN ALFA 40 MCG/0.4ML IJ SOSY: 40.0000 ug | PREFILLED_SYRINGE | INTRAMUSCULAR | Status: DC

## 2022-03-27 MED ORDER — DARBEPOETIN ALFA 40 MCG/0.4ML IJ SOSY
PREFILLED_SYRINGE | INTRAMUSCULAR | Status: AC
  Administered 2022-03-27: 40 ug via SUBCUTANEOUS
  Filled 2022-03-27: qty 0.4

## 2022-04-10 ENCOUNTER — Encounter (HOSPITAL_COMMUNITY): Payer: Medicare Other

## 2022-04-15 ENCOUNTER — Telehealth: Payer: Self-pay | Admitting: Physical Medicine and Rehabilitation

## 2022-04-15 NOTE — Telephone Encounter (Signed)
Patient states he is a former patient of Dr. Louanne Skye, and need to make an appointment with Dr. Ernestina Patches

## 2022-04-16 NOTE — Telephone Encounter (Signed)
LVM to return call to clinic.

## 2022-04-18 ENCOUNTER — Telehealth: Payer: Self-pay | Admitting: Physical Medicine and Rehabilitation

## 2022-04-18 NOTE — Telephone Encounter (Signed)
LVM to return call to schedule OV since he has not seen Dr. Louanne Skye in years

## 2022-04-18 NOTE — Telephone Encounter (Signed)
Patient returned call asked for a call back needing to schedule an appointment with Dr. Ernestina Patches. The number to contact patient is [phone]

## 2022-04-23 ENCOUNTER — Ambulatory Visit (INDEPENDENT_AMBULATORY_CARE_PROVIDER_SITE_OTHER): Payer: Medicare Other | Admitting: Physical Medicine and Rehabilitation

## 2022-04-23 ENCOUNTER — Encounter: Payer: Self-pay | Admitting: Physical Medicine and Rehabilitation

## 2022-04-23 ENCOUNTER — Other Ambulatory Visit (INDEPENDENT_AMBULATORY_CARE_PROVIDER_SITE_OTHER): Payer: Medicare Other

## 2022-04-23 DIAGNOSIS — G8929 Other chronic pain: Secondary | ICD-10-CM

## 2022-04-23 DIAGNOSIS — R4189 Other symptoms and signs involving cognitive functions and awareness: Secondary | ICD-10-CM

## 2022-04-23 DIAGNOSIS — M5442 Lumbago with sciatica, left side: Secondary | ICD-10-CM | POA: Diagnosis not present

## 2022-04-23 DIAGNOSIS — M5416 Radiculopathy, lumbar region: Secondary | ICD-10-CM

## 2022-04-23 DIAGNOSIS — M961 Postlaminectomy syndrome, not elsewhere classified: Secondary | ICD-10-CM

## 2022-04-23 DIAGNOSIS — M5441 Lumbago with sciatica, right side: Secondary | ICD-10-CM

## 2022-04-23 NOTE — Progress Notes (Unsigned)
Derek Blackburn - 87 y.o. male MRN [id number]  Date of birth: 24-Oct-1935  Office Visit Note: Visit Date: 04/23/2022 PCP: Vonna Drafts, FNP Referred by: Vonna Drafts, FNP  Subjective: Chief Complaint  Patient presents with   Lower Back - Pain   HPI: Derek Blackburn is a 87 y.o. male who comes in today as a self referral for evaluation of chronic, worsening and severe bilateral lower back pain, intermittent radiation of pain down both posterolateral legs. Bilateral lower back seems to be more problematic. Previous patient of Dr. Basil Dess. Patient is poor historian, he has difficulty answering questions, get confused easily during our visit today. Pain ongoing for several years, became worse over the last several weeks. He describes pain as sore and aching, currently rates as 8 out of 10. Some relief of pain with home exercise regimen, rest and OTC medications. He does go to [facility] multiple times a week to work out. 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 History of prior laminectomy at the level of L4-L5 by Dr. Louanne Blackburn in 2000. I attempted to access his records in our old [place] system today, however there is none present. Patient was recently evaluated by his primary care provider Derek Cooley, FNP, she did place him on Aricept. Patient denies focal weakness, numbness and tingling. No recent trauma or falls.     {Oswestry Disability Score:26558}  Review of Systems  Musculoskeletal:  Positive for back pain.  Neurological:  Negative for tingling, sensory change, focal weakness and weakness.  All other systems reviewed and are negative.  Otherwise per HPI.  Assessment & Plan: Visit Diagnoses:    ICD-10-CM   1. Lumbar radiculopathy  M54.16 XR Lumbar Spine 2-3 Views    MR LUMBAR SPINE WO CONTRAST    2. Chronic bilateral low back pain with bilateral sciatica  M54.42 MR LUMBAR SPINE WO CONTRAST   M54.41    G89.29     3. Post laminectomy  syndrome  M96.1 MR LUMBAR SPINE WO CONTRAST    4. Cognitive decline  R41.89        Plan: Findings:  Chronic, worsening and severe bilateral lower back pain, intermittent radiation of pain down both posterolateral legs. Bilateral lower back pain is more severe. Patient continues to have severe pain despite good conservative therapies such as home exercise regimen, rest and use of OTC medications. Patients clinical presentation and exam are complex, differentials could include lumbar radiculopathy vs facet mediated pain. I did obtain 2 view lumbar x-rays in the office today that exhibit multi level spondylosis, mild lumbar dextroscoliosis, facet arthropathy with spurring to lower lumbar spine, pathological fusion of L4-L5. Next step is to obtain lumbar MRI imaging. Depending on MRI imaging we did discuss possible lumbar injections. Would be helpful if patient could bring family member to his next visit. His interview was very difficult today as he was unable to answer most questions. I did attempt to call his wife today however she did not answer. No red flag symptoms noted upon exam today.     Meds & Orders: No orders of the defined types were placed in this encounter.   Orders Placed This Encounter  Procedures   XR Lumbar Spine 2-3 Views   MR LUMBAR SPINE WO CONTRAST    Follow-up: Return for follow up for lumbar MRI review.   Procedures: No procedures performed      Clinical History: No specialty comments available.   He  reports that he has never smoked. He has never used smokeless tobacco. No results for input(s): "HGBA1C", "LABURIC" in the last 8760 hours.  Objective:  VS:  HT:    WT:   BMI:     BP:   HR: bpm  TEMP: ( )  RESP:  Physical Exam Vitals and nursing note reviewed.  HENT:     Head: Normocephalic and atraumatic.     Right Ear: External ear normal.     Left Ear: External ear normal.     Nose: Nose normal.     Mouth/Throat:     Mouth: Mucous membranes are moist.   Eyes:     Extraocular Movements: Extraocular movements intact.  Cardiovascular:     Rate and Rhythm: Normal rate.     Pulses: Normal pulses.  Pulmonary:     Effort: Pulmonary effort is normal.  Abdominal:     General: Abdomen is flat. There is no distension.  Musculoskeletal:        General: Tenderness present.     Cervical back: Normal range of motion.     Comments: Patient rises from seated position to standing without difficulty. Good lumbar range of motion. No pain noted with facet loading. 5/5 strength noted with bilateral hip flexion, knee flexion/extension, ankle dorsiflexion/plantarflexion and EHL. No clonus noted bilaterally. No pain upon palpation of greater trochanters. No pain with internal/external rotation of bilateral hips. Sensation intact bilaterally. Ambulates without aid, gait steady.     Skin:    General: Skin is warm and dry.     Capillary Refill: Capillary refill takes less than 2 seconds.  Neurological:     General: No focal deficit present.     Mental Status: He is alert and oriented to person, place, and time.  Psychiatric:        Mood and Affect: Mood normal.        Behavior: Behavior normal.     Ortho Exam  Imaging: XR Lumbar Spine 2-3 Views  Result Date: 04/23/2022 2 view lumbar radiographs exhibit mild lumbar dextroscoliosis, multi level spondylosis, facet arthropathy and spurring noted to lower lumbar spine, pathological fusion of L4-L5. No acute fractures or dislocations.   Past Medical/Family/Surgical/Social History: Medications & Allergies reviewed per EMR, new medications updated. Patient Active Problem List   Diagnosis Date Noted   History of anemia due to CKD 09/25/2021   Malignant neoplasm of prostate ([place]) 02/08/2020   Past Medical History:  Diagnosis Date   Anemia    CKD (chronic kidney disease)    Heart murmur    Hypertension    Hypertensive urgency    Peripheral polyneuropathy    Prostate cancer ([place])    PVD (peripheral  vascular disease) ([place])    Family History  Problem Relation Age of Onset   Breast cancer Neg Hx    Colon cancer Neg Hx    Prostate cancer Neg Hx    Pancreatic cancer Neg Hx    Past Surgical History:  Procedure Laterality Date   BACK SURGERY     PROSTATE BIOPSY     Social History   Occupational History    Comment: retired   Occupation: Retired  Tobacco Use   Smoking status: Never   Smokeless tobacco: Never  Scientific laboratory technician Use: Never used  Substance and Sexual Activity   Alcohol use: Yes    Comment: occasionally   Drug use: No   Sexual activity: Yes

## 2022-04-23 NOTE — Progress Notes (Unsigned)
Functional Pain Scale - descriptive words and definitions  Moderate (4)   Constantly aware of pain, can complete ADLs with modification/sleep marginally affected at times/passive distraction is of no use, but active distraction gives some relief. Moderate range order  Average Pain 6  Lower back pain in the center with possible radiation in legs. Takes Tylenol for pain

## 2022-04-24 ENCOUNTER — Ambulatory Visit (HOSPITAL_COMMUNITY)
Admission: RE | Admit: 2022-04-24 | Discharge: 2022-04-24 | Disposition: A | Discharge status: Home / Self Care | Payer: Medicare Other | Source: Ambulatory Visit | Attending: Nephrology | Admitting: Nephrology

## 2022-04-24 VITALS — BP 140/89 | HR 80 | Temp 97.2°F | Resp 18

## 2022-04-24 DIAGNOSIS — Z862 Personal history of diseases of the blood and blood-forming organs and certain disorders involving the immune mechanism: Secondary | ICD-10-CM | POA: Insufficient documentation

## 2022-04-24 DIAGNOSIS — N189 Chronic kidney disease, unspecified: Secondary | ICD-10-CM | POA: Diagnosis not present

## 2022-04-24 LAB — POCT HEMOGLOBIN-HEMACUE: Hemoglobin: 9 g/dL — ABNORMAL LOW (ref 13.0–17.0)

## 2022-04-24 LAB — IRON AND TIBC
Iron: 53 ug/dL (ref 45–182)
Saturation Ratios: 25 % (ref 17.9–39.5)
TIBC: 209 ug/dL — ABNORMAL LOW (ref 250–450)
UIBC: 156 ug/dL

## 2022-04-24 LAB — FERRITIN: Ferritin: 608 ng/mL — ABNORMAL HIGH (ref 24–336)

## 2022-04-24 MED ORDER — DARBEPOETIN ALFA 40 MCG/0.4ML IJ SOSY
PREFILLED_SYRINGE | INTRAMUSCULAR | Status: AC
  Administered 2022-04-24: 40 ug via SUBCUTANEOUS
  Filled 2022-04-24: qty 0.4

## 2022-04-24 MED ORDER — DARBEPOETIN ALFA 40 MCG/0.4ML IJ SOSY: 40.0000 ug | PREFILLED_SYRINGE | INTRAMUSCULAR | Status: DC

## 2022-05-04 ENCOUNTER — Ambulatory Visit
Admission: RE | Admit: 2022-05-04 | Discharge: 2022-05-04 | Disposition: A | Discharge status: Home / Self Care | Payer: Medicare Other | Source: Ambulatory Visit | Attending: Physical Medicine and Rehabilitation | Admitting: Physical Medicine and Rehabilitation

## 2022-05-04 DIAGNOSIS — G8929 Other chronic pain: Secondary | ICD-10-CM

## 2022-05-04 DIAGNOSIS — M961 Postlaminectomy syndrome, not elsewhere classified: Secondary | ICD-10-CM

## 2022-05-04 DIAGNOSIS — M5416 Radiculopathy, lumbar region: Secondary | ICD-10-CM

## 2022-05-08 ENCOUNTER — Telehealth: Payer: Self-pay | Admitting: Physical Medicine and Rehabilitation

## 2022-05-08 ENCOUNTER — Encounter (HOSPITAL_COMMUNITY)
Admission: RE | Admit: 2022-05-08 | Discharge: 2022-05-08 | Disposition: A | Discharge status: Home / Self Care | Payer: Medicare Other | Source: Ambulatory Visit | Attending: Nephrology | Admitting: Nephrology

## 2022-05-08 VITALS — BP 137/85 | HR 89 | Temp 97.0°F | Resp 18

## 2022-05-08 DIAGNOSIS — Z862 Personal history of diseases of the blood and blood-forming organs and certain disorders involving the immune mechanism: Secondary | ICD-10-CM | POA: Insufficient documentation

## 2022-05-08 DIAGNOSIS — N189 Chronic kidney disease, unspecified: Secondary | ICD-10-CM | POA: Diagnosis not present

## 2022-05-08 LAB — POCT HEMOGLOBIN-HEMACUE: Hemoglobin: 9.8 g/dL — ABNORMAL LOW (ref 13.0–17.0)

## 2022-05-08 MED ORDER — DARBEPOETIN ALFA 40 MCG/0.4ML IJ SOSY
PREFILLED_SYRINGE | INTRAMUSCULAR | Status: AC
  Filled 2022-05-08: qty 0.4

## 2022-05-08 MED ORDER — DARBEPOETIN ALFA 40 MCG/0.4ML IJ SOSY
40.0000 ug | PREFILLED_SYRINGE | INTRAMUSCULAR | Status: DC
  Administered 2022-05-08: 40 ug via SUBCUTANEOUS

## 2022-05-08 NOTE — Telephone Encounter (Signed)
Attempted to call patient this morning to discuss recent lumbar MRI imaging, no answer. Spinal canal stenosis noted at L2-L3 and L3-L4, would consider interlaminar injection at L3-L4. Patient instructed to call back.

## 2022-05-20 ENCOUNTER — Encounter: Payer: Self-pay | Admitting: Physician Assistant

## 2022-05-20 ENCOUNTER — Other Ambulatory Visit (INDEPENDENT_AMBULATORY_CARE_PROVIDER_SITE_OTHER): Payer: Medicare Other

## 2022-05-20 ENCOUNTER — Ambulatory Visit: Payer: Medicare Other | Admitting: Physician Assistant

## 2022-05-20 VITALS — BP 91/50 | HR 77 | Resp 18 | Ht 70.5 in | Wt 155.0 lb

## 2022-05-20 DIAGNOSIS — R413 Other amnesia: Secondary | ICD-10-CM

## 2022-05-20 LAB — TSH: TSH: 0.4 u[IU]/mL (ref 0.35–5.50)

## 2022-05-20 LAB — VITAMIN B12: Vitamin B-12: 721 pg/mL (ref 211–911)

## 2022-05-20 NOTE — Patient Instructions (Addendum)

## 2022-05-20 NOTE — Progress Notes (Signed)
Assessment/Plan:    The patient is seen in neurologic consultation at the request of Diamantina Providence, FNP for the evaluation of memory.  Derek Blackburn is a very pleasant 87 y.o. year old RH male with a history of hypertension, hyperlipidemia, anemia requiring Aranesp injections, likely due to CKD stage IIIb, history of prostate cancer, lumbar radiculopathy followed by orthopedics, seen today for evaluation of memory loss. MoCA today is 18/30. Patient is able to participate in his ADLs without significant difficulty.  Patient is on donepezil 5 mg daily by PCP, tolerating well he continues to drive without any issues.  The etiology is likely multifactorial, although further workup is indicated   Memory Impairment  MRI brain without contrast to assess for underlying structural abnormality and assess vascular load  Neurocognitive testing to further evaluate cognitive concerns and determine other underlying cause of memory changes, including potential contribution from sleep, anxiety, or depression  Continue donepezil 5 mg daily, side effects discussed.  May consider increasing it to 10 mg daily pending on the results of the MRI. Check B12, TSH Recommend good control of cardiovascular risk factors.  Blood pressure is low today, recommended that the patient be evaluated by PCP for possible adjustment of his blood pressure medication (he is on 2 meds) Mood control as per PCP Continue to follow anemia, as per nephrology Follow-up with orthopedics regarding chronic back pain, lumbar disease. Folllow up in    Subjective:    The patient is accompanied by his wife  who supplements the history.    How long did patient have memory difficulties?  "Between 6-12 months ". He reports that his emory issues are vague, "I cannot pinpoint it, is kind of off". He does not do brain activities, although he likes reading and watching the news and sports on TV.  His PCP placed him on donepezil 5 mg daily,  tolerating well (since March of this year).  He also takes, cannot oil, B12 and fish oil repeats oneself?  Endorsed, more frequently than before  Disoriented when walking into a room?  Patient denies  Leaving objects in unusual places?   denies   Wandering behavior? denies   Any personality changes since last visit? "Not a great change" Any history of depression?:  denies   Hallucinations or paranoia?  denies   Seizures? denies    Any sleep changes?   He goes to sleep right after dinner . Denies vivid dreams, REM behavior or sleepwalking   Sleep apnea? denies   Any hygiene concerns?  denies   Independent of bathing and dressing?  Endorsed  Does the patient need help with medications? Patient  is in charge   Who is in charge of the finances?  Both of them are  in charge     Any changes in appetite?   denies     Patient have trouble swallowing?  denies   Does the patient cook?  Wife does the cooking  Any headaches?  denies   Chronic back pain?  Endorsed.  He has significant bilateral lower back pain radiating to both legs, and chronic postlaminectomy syndrome, this is followed by orthopedics, and is scheduled for possible injections.  He is also doing PT. Ambulates with difficulty? [facility] He no longer plays tennis due to back pain Recent falls or head injuries? denies     Vision changes? R decrease in vision, he attributes it to glaucoma Unilateral weakness, numbness or  tingling?  denies   Any tremors?  Denies  Any anosmia? Denies Any incontinence of urine? denies   Any bowel dysfunction?    denies      Patient lives with his wife   History of heavy alcohol intake? denies   History of heavy tobacco use? denies   Family history of dementia?   Denies   Does patient drive?Yes, uses GPS when needed. Denies getting disoriented   Pertinent labs latest CBC hemoglobin 9-hematocrit 29.4. 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    No Known Allergies  Current  Outpatient Medications  Medication Instructions   amLODipine (NORVASC) 5 mg, Oral, Daily   donepezil (ARICEPT) 5 mg, Oral, Daily   hydrALAZINE (APRESOLINE) 50 mg, Oral, 3 times daily   Jardiance 10 mg, Oral, Daily   latanoprost (XALATAN) 0.005 % ophthalmic solution 1 drop, Both Eyes, Daily at bedtime   Multiple Vitamin (MULITIVITAMIN WITH MINERALS) TABS 1 tablet, Oral, Daily,     timolol (TIMOPTIC) 0.5 % ophthalmic solution 1 drop, 2 times daily   trandolapril (MAVIK) 2 mg, Oral, Daily,       VITALS:   Vitals:   05/20/22 1014  BP: (!) 91/50  Pulse: 77  Resp: 18  SpO2: 99%  Weight: 155 lb (70.3 kg)  Height: 5' 10.5" (1.791 m)       No data to display          PHYSICAL EXAM   HEENT:  Normocephalic, atraumatic. The mucous membranes are moist. The superficial temporal arteries are without ropiness or tenderness. Cardiovascular: Regular rate and rhythm. Lungs: Clear to auscultation bilaterally. Neck: There are no carotid bruits noted bilaterally.  NEUROLOGICAL:    05/20/2022   11:00 AM  Montreal Cognitive Assessment   Visuospatial/ Executive (0/5) 1  Naming (0/3) 3  Attention: Read list of digits (0/2) 2  Attention: Read list of letters (0/1) 1  Attention: Serial 7 subtraction starting at 100 (0/3) 1  Language: Repeat phrase (0/2) 2  Language : Fluency (0/1) 0  Abstraction (0/2) 2  Delayed Recall (0/5) 0  Orientation (0/6) 6  Total 18  Adjusted Score (based on education) 18        No data to display           Orientation:  Alert and oriented to person, place and time. No aphasia or dysarthria. Fund of knowledge is appropriate. Recent and remote emory impaired.  Attention and concentration are normal.  Able to name objects and repeat phrases. Delayed recall 0/5 Cranial nerves: There is good facial symmetry. Extraocular muscles are intact and visual fields are full to confrontational testing. Speech is fluent and clear. no tongue deviation. Hearing is intact to  conversational tone.  Tone: Tone is good throughout. Sensation: Sensation is intact to light touch and pinprick throughout. Vibration is intact at the bilateral big toe.There is no extinction with double simultaneous stimulation. There is no sensory dermatomal level identified. Coordination: The patient has no difficulty with RAM's or FNF bilaterally. Normal finger to nose  Motor: Strength is 5/5 in the bilateral upper and lower extremities. There is no pronator drift. There are no fasciculations noted. DTR's: Deep tendon reflexes are 2/4 at the bilateral biceps, triceps, brachioradialis, patella and achilles.  Plantar responses are downgoing bilaterally. Gait and Station: The patient is able to ambulate without difficulty but he does complain of back pain.The patient is able to ambulate in a tandem fashion, able to stand in the Romberg position.     Thank  you for allowing [place] the opportunity to participate in the care of this nice patient. Please do not hesitate to contact us for any questions or concerns.   Total time spent on today's visit was 50 minutes dedicated to this patient today, preparing to see patient, examining the patient, ordering tests and/or medications and counseling the patient, documenting clinical information in the EHR or other health record, independently interpreting results and communicating results to the patient/family, discussing treatment and goals, answering patient's questions and coordinating care.  Cc:  Diamantina Providence, FNP  Marlowe Kays 05/20/2022 11:08 AM

## 2022-05-20 NOTE — Progress Notes (Signed)
B12 and TSH are normal

## 2022-05-21 ENCOUNTER — Telehealth: Payer: Self-pay | Admitting: Physician Assistant

## 2022-05-21 NOTE — Telephone Encounter (Signed)
New message    Rtn call back to the nurse.

## 2022-05-22 ENCOUNTER — Ambulatory Visit (HOSPITAL_COMMUNITY)
Admission: RE | Admit: 2022-05-22 | Discharge: 2022-05-22 | Disposition: A | Discharge status: Home / Self Care | Payer: Medicare Other | Source: Ambulatory Visit | Attending: Nephrology | Admitting: Nephrology

## 2022-05-22 VITALS — BP 161/83 | HR 72 | Temp 97.1°F | Resp 17

## 2022-05-22 DIAGNOSIS — Z862 Personal history of diseases of the blood and blood-forming organs and certain disorders involving the immune mechanism: Secondary | ICD-10-CM | POA: Insufficient documentation

## 2022-05-22 DIAGNOSIS — N189 Chronic kidney disease, unspecified: Secondary | ICD-10-CM | POA: Insufficient documentation

## 2022-05-22 LAB — IRON AND TIBC
Iron: 60 ug/dL (ref 45–182)
Saturation Ratios: 25 % (ref 17.9–39.5)
TIBC: 244 ug/dL — ABNORMAL LOW (ref 250–450)
UIBC: 184 ug/dL

## 2022-05-22 LAB — POCT HEMOGLOBIN-HEMACUE: Hemoglobin: 9.7 g/dL — ABNORMAL LOW (ref 13.0–17.0)

## 2022-05-22 LAB — FERRITIN: Ferritin: 416 ng/mL — ABNORMAL HIGH (ref 24–336)

## 2022-05-22 MED ORDER — DARBEPOETIN ALFA 40 MCG/0.4ML IJ SOSY
40.0000 ug | PREFILLED_SYRINGE | INTRAMUSCULAR | Status: DC
  Administered 2022-05-22: 40 ug via SUBCUTANEOUS

## 2022-05-22 MED ORDER — DARBEPOETIN ALFA 40 MCG/0.4ML IJ SOSY
PREFILLED_SYRINGE | INTRAMUSCULAR | Status: AC
  Filled 2022-05-22: qty 0.4

## 2022-05-22 NOTE — Telephone Encounter (Signed)
Left message again.

## 2022-05-22 NOTE — Telephone Encounter (Signed)
Pt called in and left a message with the access nurse on 05/21/22. He is trying to return a call to the office.

## 2022-05-23 ENCOUNTER — Telehealth: Payer: Self-pay | Admitting: Physical Medicine and Rehabilitation

## 2022-05-23 ENCOUNTER — Other Ambulatory Visit: Payer: Self-pay | Admitting: Physical Medicine and Rehabilitation

## 2022-05-23 DIAGNOSIS — M48062 Spinal stenosis, lumbar region with neurogenic claudication: Secondary | ICD-10-CM

## 2022-05-23 DIAGNOSIS — M5416 Radiculopathy, lumbar region: Secondary | ICD-10-CM

## 2022-05-23 NOTE — Telephone Encounter (Signed)
Spoke with patient this afternoon regarding recent lumbar MRI imaging, there is severe spinal canal stenosis noted at L2-L3, L3-L4, and L5-S1. I talked with patient about injection procedure, he would like to proceed. I will place order for interlaminar epidural steroid injection at L3-L4. He is not taking anticoagulants. Patient does have dementia, he will have someone drive him to appointment.

## 2022-05-23 NOTE — Telephone Encounter (Signed)
Pt returned call to [facility] for MRI results. Please call pt today if possible. Pt phone number is [phone].

## 2022-06-03 ENCOUNTER — Encounter: Payer: Self-pay | Admitting: Cardiovascular Disease

## 2022-06-03 ENCOUNTER — Ambulatory Visit: Payer: Medicare Other | Attending: Cardiovascular Disease | Admitting: Cardiovascular Disease

## 2022-06-03 VITALS — BP 162/88 | HR 73 | Ht 70.5 in | Wt 152.4 lb

## 2022-06-03 DIAGNOSIS — I350 Nonrheumatic aortic (valve) stenosis: Secondary | ICD-10-CM | POA: Diagnosis not present

## 2022-06-03 DIAGNOSIS — I10 Essential (primary) hypertension: Secondary | ICD-10-CM | POA: Diagnosis not present

## 2022-06-03 MED ORDER — AMLODIPINE BESYLATE 10 MG PO TABS: 10.0000 mg | ORAL_TABLET | Freq: Every day | ORAL | 3 refills | Status: DC

## 2022-06-03 NOTE — Progress Notes (Signed)
Chief Complaint  Patient presents with   Follow-up    Aortic stenosis   History of Present Illness: 87 yo male with history of anemia, CKD, HTN, prostate cancer and PAD who is here today for follow up. He was seen as a new patient in November 2023 for the evaluation of a cardiac murmur. He had no complaints. Echo December 2023 with LVEF=60-65%. Mild aortic stenosis, mild aortic regurgitation.   He is here today for follow up. The patient denies any chest pain, dyspnea, palpitations, lower extremity edema, orthopnea, PND, dizziness, near syncope or syncope. He feels great except for back pain. BP has been 140-160 systolic at home  Primary Care Physician: Diamantina Providence, FNP   Past Medical History:  Diagnosis Date   Anemia    CKD (chronic kidney disease)    Heart murmur    Hypertension    Hypertensive urgency    Peripheral polyneuropathy    Prostate cancer ([facility])    PVD (peripheral vascular disease) ([facility])     Past Surgical History:  Procedure Laterality Date   BACK SURGERY     PROSTATE BIOPSY      Current Outpatient Medications  Medication Sig Dispense Refill   amLODipine (NORVASC) 10 MG tablet Take 1 tablet (10 mg total) by mouth daily. 90 tablet 3   donepezil (ARICEPT) 5 MG tablet Take 5 mg by mouth daily.     hydrALAZINE (APRESOLINE) 50 MG tablet Take 50 mg by mouth 3 (three) times daily.     JARDIANCE 10 MG TABS tablet Take 10 mg by mouth daily.     latanoprost (XALATAN) 0.005 % ophthalmic solution Place 1 drop into both eyes at bedtime.     Multiple Vitamin (MULITIVITAMIN WITH MINERALS) TABS Take 1 tablet by mouth daily.     timolol (TIMOPTIC) 0.5 % ophthalmic solution 1 drop 2 (two) times daily.     trandolapril (MAVIK) 2 MG tablet Take 2 mg by mouth daily.     No current facility-administered medications for this visit.    No Known Allergies  Social History   Socioeconomic History   Marital status: Married    Spouse name: Not on file   Number of  children: 3   Years of education: Not on file   Highest education level: Not on file  Occupational History    Comment: retired   Occupation: Retired  Tobacco Use   Smoking status: Never   Smokeless tobacco: Never  Building services engineer Use: Never used  Substance and Sexual Activity   Alcohol use: Yes    Comment: occasionally   Drug use: No   Sexual activity: Yes  Other Topics Concern   Not on file  Social History Narrative   2 sons, 1 daughter   Right handed   Drinks caffeine prn   Lives with wife   One floor home   retired   Chief Executive Officer Determinants of Corporate investment banker Strain: Not on file  Food Insecurity: Not on file  Transportation Needs: Not on file  Physical Activity: Not on file  Stress: Not on file  Social Connections: Not on file  Intimate Partner Violence: Not on file    Family History  Problem Relation Age of Onset   Breast cancer Neg Hx    Colon cancer Neg Hx    Prostate cancer Neg Hx    Pancreatic cancer Neg Hx     Review of Systems:  As stated in the HPI and  otherwise negative.   BP (!) 162/88   Pulse 73   Ht 5' 10.5" (1.791 m)   Wt 69.1 kg   SpO2 98%   BMI 21.56 kg/m   Physical Examination: General: Well developed, well nourished, NAD  HEENT: OP clear, mucus membranes moist  SKIN: warm, dry. No rashes. Neuro: No focal deficits  Musculoskeletal: Muscle strength 5/5 all ext  Psychiatric: Mood and affect normal  Neck: No JVD, no carotid bruits, no thyromegaly, no lymphadenopathy.  Lungs:Clear bilaterally, no wheezes, rhonci, crackles Cardiovascular: Regular rate and rhythm. Systolic murmur.  Abdomen:Soft. Bowel sounds present. Non-tender.  Extremities: No lower extremity edema. Pulses are 2 + in the bilateral DP/PT.  EKG:  EKG is not ordered today. The ekg ordered today demonstrates   Echo December 2023:  1. The aortic valve is tricuspid. There is moderate calcification of the  aortic valve. There is moderate thickening of the  aortic valve. Aortic  valve regurgitation is mild. Mild aortic valve stenosis. Aortic valve  area, by VTI measures 2.00 cm. Aortic  valve mean gradient measures 16.0 mmHg. Aortic valve Vmax measures 2.50  m/s.   2. Left ventricular ejection fraction, by estimation, is 60 to 65%. The  left ventricle has normal function. The left ventricle has no regional  wall motion abnormalities. There is moderate concentric left ventricular  hypertrophy. Left ventricular  diastolic parameters are consistent with Grade I diastolic dysfunction  (impaired relaxation).   3. Right ventricular systolic function is normal. The right ventricular  size is normal. There is normal pulmonary artery systolic pressure. The  estimated right ventricular systolic pressure is 30.7 mmHg.   4. The mitral valve is grossly normal. Trivial mitral valve  regurgitation. No evidence of mitral stenosis.   5. The inferior vena cava is normal in size with greater than 50%  respiratory variability, suggesting right atrial pressure of 3 mmHg.   Recent Labs: 07/20/2021: BUN 26; Creatinine, Ser 1.88; Platelets 181; Potassium 4.3; Sodium 141 05/20/2022: TSH 0.40 05/22/2022: Hemoglobin 9.7   Lipid Panel No results found for: "CHOL", "TRIG", "HDL", "CHOLHDL", "VLDL", "LDLCALC", "LDLDIRECT"   Wt Readings from Last 3 Encounters:  06/03/22 69.1 kg  05/20/22 70.3 kg  12/05/21 69.2 kg    Assessment and Plan:   1. Aortic valve disease: He has mild AS/AI by echo in December 2023. He has no dyspnea, dizziness or evidence of CHF. Will plan to repeat his echo in December 2025.    2. HTN: BP is elevated at home and here today. Will increase Norvasc to 10 mg daily.   Labs/ tests ordered today include:   No orders of the defined types were placed in this encounter.  Disposition:   F/U with me in 12 months  Signed, Verne Carrow, MD, [facility] 06/03/2022 10:50 AM    [facility] [address] [place], [place], [place]   [address] Phone: [phone]; Fax: [phone]

## 2022-06-03 NOTE — Patient Instructions (Signed)
Medication Instructions:  Your physician has recommended you make the following change in your medication:  1.) increase amlodipine (Norvasc) to 10 mg - one tablet daily  *If you need a refill on your cardiac medications before your next appointment, please call your pharmacy*   Lab Work: none If you have labs (blood work) drawn today and your tests are completely normal, you will receive your results only by: MyChart Message (if you have MyChart) OR A paper copy in the mail If you have any lab test that is abnormal or we need to change your treatment, we will call you to review the results.   Testing/Procedures: none   Follow-Up: At [facility], you and your health needs are our priority.  As part of our continuing mission to provide you with exceptional heart care, we have created designated Provider Care Teams.  These Care Teams include your primary Cardiologist (physician) and Advanced Practice Providers (APPs -  Physician Assistants and Nurse Practitioners) who all work together to provide you with the care you need, when you need it.   Your next appointment:   12 month(s)  Provider:   Verne Carrow, MD

## 2022-06-05 ENCOUNTER — Ambulatory Visit (HOSPITAL_COMMUNITY)
Admission: RE | Admit: 2022-06-05 | Discharge: 2022-06-05 | Disposition: A | Discharge status: Home / Self Care | Payer: Medicare Other | Source: Ambulatory Visit | Attending: Nephrology | Admitting: Nephrology

## 2022-06-05 ENCOUNTER — Ambulatory Visit: Payer: Medicare Other | Admitting: Physical Medicine and Rehabilitation

## 2022-06-05 ENCOUNTER — Other Ambulatory Visit: Payer: Self-pay

## 2022-06-05 VITALS — BP 165/80 | HR 85

## 2022-06-05 VITALS — BP 154/90 | HR 74 | Temp 97.3°F | Resp 18

## 2022-06-05 DIAGNOSIS — Z862 Personal history of diseases of the blood and blood-forming organs and certain disorders involving the immune mechanism: Secondary | ICD-10-CM | POA: Diagnosis present

## 2022-06-05 DIAGNOSIS — N189 Chronic kidney disease, unspecified: Secondary | ICD-10-CM | POA: Diagnosis present

## 2022-06-05 DIAGNOSIS — M5416 Radiculopathy, lumbar region: Secondary | ICD-10-CM

## 2022-06-05 LAB — POCT HEMOGLOBIN-HEMACUE: Hemoglobin: 10.4 g/dL — ABNORMAL LOW (ref 13.0–17.0)

## 2022-06-05 MED ORDER — METHYLPREDNISOLONE ACETATE 80 MG/ML IJ SUSP
80.0000 mg | Freq: Once | INTRAMUSCULAR | Status: AC
Start: 2022-06-05 — End: 2022-06-05
  Administered 2022-06-05: 80 mg

## 2022-06-05 MED ORDER — DARBEPOETIN ALFA 40 MCG/0.4ML IJ SOSY: 40.0000 ug | PREFILLED_SYRINGE | INTRAMUSCULAR | Status: DC

## 2022-06-05 MED ORDER — DARBEPOETIN ALFA 40 MCG/0.4ML IJ SOSY
PREFILLED_SYRINGE | INTRAMUSCULAR | Status: AC
  Administered 2022-06-05: 40 ug via SUBCUTANEOUS
  Filled 2022-06-05: qty 0.4

## 2022-06-05 NOTE — Patient Instructions (Signed)

## 2022-06-05 NOTE — Progress Notes (Signed)
Functional Pain Scale - descriptive words and definitions  Moderate (4)   Constantly aware of pain, can complete ADLs with modification/sleep marginally affected at times/passive distraction is of no use, but active distraction gives some relief. Moderate range order  Average Pain  varies   +Driver, -BT, -Dye Allergies.  Lower back pain on right side that can radiate into right leg

## 2022-06-07 ENCOUNTER — Ambulatory Visit
Admission: RE | Admit: 2022-06-07 | Discharge: 2022-06-07 | Disposition: A | Discharge status: Home / Self Care | Payer: Medicare Other | Source: Ambulatory Visit | Attending: Physician Assistant | Admitting: Physician Assistant

## 2022-06-18 NOTE — Progress Notes (Signed)
MARLEE SERMENO - 87 y.o. male MRN [id number]  Date of birth: 1935-06-24  Office Visit Note: Visit Date: 06/05/2022 PCP: Diamantina Providence, FNP Referred by: Diamantina Providence, FNP  Subjective: Chief Complaint  Patient presents with   Lower Back - Pain   HPI:  JEIDEN HOESING is a 87 y.o. male who comes in today at the request of Ellin Goodie, FNP for planned Right L3-4 Lumbar Interlaminar epidural steroid injection with fluoroscopic guidance.  The patient has failed conservative care including home exercise, medications, time and activity modification.  This injection will be diagnostic and hopefully therapeutic.  Please see requesting physician notes for further details and justification.   ROS Otherwise per HPI.  Assessment & Plan: Visit Diagnoses:    ICD-10-CM   1. Lumbar radiculopathy  M54.16 XR C-ARM NO REPORT    Epidural Steroid injection    methylPREDNISolone acetate (DEPO-MEDROL) injection 80 mg      Plan: No additional findings.   Meds & Orders:  Meds ordered this encounter  Medications   methylPREDNISolone acetate (DEPO-MEDROL) injection 80 mg    Orders Placed This Encounter  Procedures   XR C-ARM NO REPORT   Epidural Steroid injection    Follow-up: Return for visit to requesting provider as needed.   Procedures: No procedures performed  Lumbar Epidural Steroid Injection - Interlaminar Approach with Fluoroscopic Guidance  Patient: HEWEY REDINGTON      Date of Birth: 19-Apr-1935 MRN: [id number] PCP: Diamantina Providence, FNP      Visit Date: 06/05/2022   Universal Protocol:     Consent Given By: the patient  Position: PRONE  Additional Comments: Vital signs were monitored before and after the procedure. Patient was prepped and draped in the usual sterile fashion. The correct patient, procedure, and site was verified.   Injection Procedure Details:   Procedure diagnoses: Lumbar radiculopathy [M54.16]   Meds Administered:  Meds  ordered this encounter  Medications   methylPREDNISolone acetate (DEPO-MEDROL) injection 80 mg     Laterality: Right  Location/Site:  L3-4  Needle: 3.5 in., 20 ga. Tuohy  Needle Placement: Paramedian epidural  Findings:   -Comments: Excellent flow of contrast into the epidural space.  Procedure Details: Using a paramedian approach from the side mentioned above, the region overlying the inferior lamina was localized under fluoroscopic visualization and the soft tissues overlying this structure were infiltrated with 4 ml. of 1% Lidocaine without Epinephrine. The Tuohy needle was inserted into the epidural space using a paramedian approach.   The epidural space was localized using loss of resistance along with counter oblique bi-planar fluoroscopic views.  After negative aspirate for air, blood, and CSF, a 2 ml. volume of Isovue-250 was injected into the epidural space and the flow of contrast was observed. Radiographs were obtained for documentation purposes.    The injectate was administered into the level noted above.   Additional Comments:  No complications occurred Dressing: 2 x 2 sterile gauze and Band-Aid    Post-procedure details: Patient was observed during the procedure. Post-procedure instructions were reviewed.  Patient left the clinic in stable condition.   Clinical History: CLINICAL DATA:  Back pain. History of prostate cancer. History of back surgery.   EXAM: MRI LUMBAR SPINE WITHOUT CONTRAST   TECHNIQUE: Multiplanar, multisequence MR imaging of the lumbar spine was performed. No intravenous contrast was administered.   COMPARISON:  None Available.   FINDINGS: Segmentation:  Standard.   Alignment:  There is straightening of the normal  lumbar lordosis.   Vertebrae: There is diffusely heterogeneous marrow signal throughout the lumbar spine in the sacrum. This may be related to a history of radiation therapy. Correlate with patient's history. There  is osseous fusion L4-L5 and L5-S1.   Conus medullaris and cauda equina: Conus extends to the L1-L2 disc space level. Conus and cauda equina appear normal.   Paraspinal and other soft tissues: There are multiple T2 hyperintense renal lesions lesions are nonspecific, but favored to represent simple renal cysts. These require no further workup. Fatty atrophy of the paraspinal musculature.   Disc levels:   T12-L1: Severe left and moderate right facet degenerative change. Eccentric right disc bulge. Mild overall spinal canal narrowing. Severe right neural foraminal narrowing.   L1-L2: Severe bilateral facet degenerative change. Eccentric left disc bulge. Moderate spinal canal narrowing. Severe bilateral neural foraminal narrowing.   L2-L3: Severe bilateral facet degenerative change. Short pedicles. Circumferential disc bulge. Severe spinal canal narrowing. Moderate left and mild right neural foraminal narrowing.   L3-L4: Severe bilateral facet degenerative change. Short pedicles. Circumferential disc bulge. Severe spinal canal narrowing. Moderate right and mild left neural foraminal narrowing.   L4-L5: Severe bilateral facet degenerative change. No spinal canal narrowing. No significant disc bulge. Moderate left and mild right neural foraminal narrowing.   L5-S1: Moderate bilateral facet degenerative change. No significant disc bulge. No spinal canal narrowing. Mild bilateral neural foraminal narrowing.   IMPRESSION: 1. Severe spinal canal narrowing at L2-L3, L3-L4, and L4-L5. 2. Severe neural foraminal narrowing at L1-L2 (bilateral) and T12-L1 (right). 3. Diffusely heterogeneous marrow signal throughout the lumbar spine and the sacrum. This may be related to a history of radiation therapy. Correlate with patient's history.     Electronically Signed   By: Lorenza Cambridge M.D.   On: 05/07/2022 14:00     Objective:  VS:  HT:    WT:   BMI:     BP:(!) 165/80  HR:85bpm   TEMP: ( )  RESP:  Physical Exam Vitals and nursing note reviewed.  Constitutional:      General: He is not in acute distress.    Appearance: Normal appearance. He is not ill-appearing.  HENT:     Head: Normocephalic and atraumatic.     Right Ear: External ear normal.     Left Ear: External ear normal.     Nose: No congestion.  Eyes:     Extraocular Movements: Extraocular movements intact.  Cardiovascular:     Rate and Rhythm: Normal rate.     Pulses: Normal pulses.  Pulmonary:     Effort: Pulmonary effort is normal. No respiratory distress.  Abdominal:     General: There is no distension.     Palpations: Abdomen is soft.  Musculoskeletal:        General: No tenderness or signs of injury.     Cervical back: Neck supple.     Right lower leg: No edema.     Left lower leg: No edema.     Comments: Patient has good distal strength without clonus.  Skin:    Findings: No erythema or rash.  Neurological:     General: No focal deficit present.     Mental Status: He is alert and oriented to person, place, and time.     Sensory: No sensory deficit.     Motor: No weakness or abnormal muscle tone.     Coordination: Coordination normal.  Psychiatric:        Mood and Affect: Mood normal.  Behavior: Behavior normal.      Imaging: No results found.

## 2022-06-18 NOTE — Procedures (Signed)
Lumbar Epidural Steroid Injection - Interlaminar Approach with Fluoroscopic Guidance  Patient: Derek Blackburn      Date of Birth: Apr 21, 1935 MRN: [id number] PCP: Diamantina Providence, FNP      Visit Date: 06/05/2022   Universal Protocol:     Consent Given By: the patient  Position: PRONE  Additional Comments: Vital signs were monitored before and after the procedure. Patient was prepped and draped in the usual sterile fashion. The correct patient, procedure, and site was verified.   Injection Procedure Details:   Procedure diagnoses: Lumbar radiculopathy [M54.16]   Meds Administered:  Meds ordered this encounter  Medications   methylPREDNISolone acetate (DEPO-MEDROL) injection 80 mg     Laterality: Right  Location/Site:  L3-4  Needle: 3.5 in., 20 ga. Tuohy  Needle Placement: Paramedian epidural  Findings:   -Comments: Excellent flow of contrast into the epidural space.  Procedure Details: Using a paramedian approach from the side mentioned above, the region overlying the inferior lamina was localized under fluoroscopic visualization and the soft tissues overlying this structure were infiltrated with 4 ml. of 1% Lidocaine without Epinephrine. The Tuohy needle was inserted into the epidural space using a paramedian approach.   The epidural space was localized using loss of resistance along with counter oblique bi-planar fluoroscopic views.  After negative aspirate for air, blood, and CSF, a 2 ml. volume of Isovue-250 was injected into the epidural space and the flow of contrast was observed. Radiographs were obtained for documentation purposes.    The injectate was administered into the level noted above.   Additional Comments:  No complications occurred Dressing: 2 x 2 sterile gauze and Band-Aid    Post-procedure details: Patient was observed during the procedure. Post-procedure instructions were reviewed.  Patient left the clinic in stable condition.

## 2022-06-19 ENCOUNTER — Encounter: Payer: Self-pay | Admitting: Physician Assistant

## 2022-06-19 ENCOUNTER — Ambulatory Visit: Payer: Medicare Other | Admitting: Physician Assistant

## 2022-06-19 ENCOUNTER — Encounter (HOSPITAL_COMMUNITY)
Admission: RE | Admit: 2022-06-19 | Discharge: 2022-06-19 | Disposition: A | Discharge status: Home / Self Care | Payer: Medicare Other | Source: Ambulatory Visit | Attending: Nephrology | Admitting: Nephrology

## 2022-06-19 VITALS — BP 146/90 | HR 59 | Temp 97.4°F | Resp 17

## 2022-06-19 VITALS — BP 151/80 | HR 60 | Resp 18 | Ht 70.5 in | Wt 158.0 lb

## 2022-06-19 DIAGNOSIS — R413 Other amnesia: Secondary | ICD-10-CM

## 2022-06-19 DIAGNOSIS — M4802 Spinal stenosis, cervical region: Secondary | ICD-10-CM

## 2022-06-19 DIAGNOSIS — Z862 Personal history of diseases of the blood and blood-forming organs and certain disorders involving the immune mechanism: Secondary | ICD-10-CM | POA: Insufficient documentation

## 2022-06-19 DIAGNOSIS — N189 Chronic kidney disease, unspecified: Secondary | ICD-10-CM | POA: Insufficient documentation

## 2022-06-19 DIAGNOSIS — M542 Cervicalgia: Secondary | ICD-10-CM | POA: Insufficient documentation

## 2022-06-19 HISTORY — DX: Cervicalgia: M54.2

## 2022-06-19 LAB — IRON AND TIBC
Iron: 52 ug/dL (ref 45–182)
Saturation Ratios: 23 % (ref 17.9–39.5)
TIBC: 227 ug/dL — ABNORMAL LOW (ref 250–450)
UIBC: 175 ug/dL

## 2022-06-19 LAB — FERRITIN: Ferritin: 261 ng/mL (ref 24–336)

## 2022-06-19 MED ORDER — DARBEPOETIN ALFA 40 MCG/0.4ML IJ SOSY
PREFILLED_SYRINGE | INTRAMUSCULAR | Status: AC
  Filled 2022-06-19: qty 0.4

## 2022-06-19 MED ORDER — DARBEPOETIN ALFA 40 MCG/0.4ML IJ SOSY
40.0000 ug | PREFILLED_SYRINGE | INTRAMUSCULAR | Status: DC
  Administered 2022-06-19: 40 ug via SUBCUTANEOUS

## 2022-06-19 MED ORDER — DONEPEZIL HCL 10 MG PO TABS: 10.0000 mg | ORAL_TABLET | Freq: Every day | ORAL | 11 refills | Status: DC

## 2022-06-19 NOTE — Patient Instructions (Addendum)
It was a pleasure to see you today at our office.   Recommendations:  Neurocognitive evaluation at our office    Continue taking donepezil but increase to 10  mg daily  Talk to your doctor about your Blood pressure  Recommend baby aspirin daily  Follow up in 6 months  Referral to your orthopedic surgeon for neck abnormalities.      For assessment of decision of mental capacity and competency:  Call Dr. Erick Blinks, geriatric psychiatrist at [phone] Counseling regarding caregiver distress, including caregiver depression, anxiety and issues regarding community resources, adult day care programs, adult living facilities, or memory care questions:  please contact your  Primary Doctor's Social Worker  Whom to call: Memory  decline, memory medications: Call our office [phone]  For psychiatric meds, mood meds: Please have your primary care physician manage these medications.  If you have any severe symptoms of a stroke, or other severe issues such as confusion,severe chills or fever, etc call 911 or go to the ER as you may need to be evaluated further    RECOMMENDATIONS FOR ALL PATIENTS WITH MEMORY PROBLEMS: 1. Continue to exercise (Recommend 30 minutes of walking everyday, or 3 hours every week) 2. Increase social interactions - continue going to [place] and enjoy social gatherings with friends and family 3. Eat healthy, avoid fried foods and eat more fruits and vegetables 4. Maintain adequate blood pressure, blood sugar, and blood cholesterol level. Reducing the risk of stroke and cardiovascular disease also helps promoting better memory. 5. Avoid stressful situations. Live a simple life and avoid aggravations. Organize your time and prepare for the next day in anticipation. 6. Sleep well, avoid any interruptions of sleep and avoid any distractions in the bedroom that may interfere with adequate sleep quality 7. Avoid sugar, avoid sweets as there is a strong link between excessive  sugar intake, diabetes, and cognitive impairment We discussed the Mediterranean diet, which has been shown to help patients reduce the risk of progressive memory disorders and reduces cardiovascular risk. This includes eating fish, eat fruits and green leafy vegetables, nuts like almonds and hazelnuts, walnuts, and also use olive oil. Avoid fast foods and fried foods as much as possible. Avoid sweets and sugar as sugar use has been linked to worsening of memory function.  There is always a concern of gradual progression of memory problems. If this is the case, then we may need to adjust level of care according to patient needs. Support, both to the patient and caregiver, should then be put into place.      You have been referred for a neuropsychological evaluation (i.e., evaluation of memory and thinking abilities). Please bring someone with you to this appointment if possible, as it is helpful for the doctor to hear from both you and another adult who knows you well. Please bring eyeglasses and hearing aids if you wear them.    The evaluation will take approximately 3 hours and has two parts:   The first part is a clinical interview with the neuropsychologist (Dr. Milbert Coulter or Dr. Roseanne Reno). During the interview, the neuropsychologist will speak with you and the individual you brought to the appointment.    The second part of the evaluation is testing with the doctor's technician Annabelle Harman or Selena Batten). During the testing, the technician will ask you to remember different types of material, solve problems, and answer some questionnaires. Your family member will not be present for this portion of the evaluation.   Please note: We  must reserve several hours of the neuropsychologist's time and the psychometrician's time for your evaluation appointment. As such, there is a No-Show fee of $100. If you are unable to attend any of your appointments, please contact our office as soon as possible to reschedule.    FALL  PRECAUTIONS: Be cautious when walking. Scan the area for obstacles that may increase the risk of trips and falls. When getting up in the mornings, sit up at the edge of the bed for a few minutes before getting out of bed. Consider elevating the bed at the head end to avoid drop of blood pressure when getting up. Walk always in a well-lit room (use night lights in the walls). Avoid area rugs or power cords from appliances in the middle of the walkways. Use a walker or a cane if necessary and consider physical therapy for balance exercise. Get your eyesight checked regularly.  FINANCIAL OVERSIGHT: Supervision, especially oversight when making financial decisions or transactions is also recommended.  HOME SAFETY: Consider the safety of the kitchen when operating appliances like stoves, microwave oven, and blender. Consider having supervision and share cooking responsibilities until no longer able to participate in those. Accidents with firearms and other hazards in the house should be identified and addressed as well.   ABILITY TO BE LEFT ALONE: If patient is unable to contact 911 operator, consider using LifeLine, or when the need is there, arrange for someone to stay with patients. Smoking is a fire hazard, consider supervision or cessation. Risk of wandering should be assessed by caregiver and if detected at any point, supervision and safe proof recommendations should be instituted.  MEDICATION SUPERVISION: Inability to self-administer medication needs to be constantly addressed. Implement a mechanism to ensure safe administration of the medications.   DRIVING: Regarding driving, in patients with progressive memory problems, driving will be impaired. We advise to have someone else do the driving if trouble finding directions or if minor accidents are reported. Independent driving assessment is available to determine safety of driving.   If you are interested in the driving assessment, you can contact  the following:  [employer] in [place] [phone]  [facility] [phone]  [facility] [phone] or [phone]    Mediterranean Diet A Mediterranean diet refers to food and lifestyle choices that are based on the traditions of countries located on the [employer]. This way of eating has been shown to help prevent certain conditions and improve outcomes for people who have chronic diseases, like kidney disease and heart disease. What are tips for following this plan? Lifestyle  Cook and eat meals together with your family, when possible. Drink enough fluid to keep your urine clear or pale yellow. Be physically active every day. This includes: Aerobic exercise like running or swimming. Leisure activities like gardening, walking, or housework. Get 7-8 hours of sleep each night. If recommended by your health care provider, drink red wine in moderation. This means 1 glass a day for nonpregnant women and 2 glasses a day for men. A glass of wine equals 5 oz (150 mL). Reading food labels  Check the serving size of packaged foods. For foods such as rice and pasta, the serving size refers to the amount of cooked product, not dry. Check the total fat in packaged foods. Avoid foods that have saturated fat or trans fats. Check the ingredients list for added sugars, such as corn syrup. Shopping  At the grocery store, buy most  of your food from the areas near the walls of the store. This includes: Fresh fruits and vegetables (produce). Grains, beans, nuts, and seeds. Some of these may be available in unpackaged forms or large amounts (in bulk). Fresh seafood. Poultry and eggs. Low-fat dairy products. Buy whole ingredients instead of prepackaged foods. Buy fresh fruits and vegetables in-season from local farmers markets. Buy frozen fruits and vegetables in resealable bags. If you do not have access to  quality fresh seafood, buy precooked frozen shrimp or canned fish, such as tuna, salmon, or sardines. Buy small amounts of raw or cooked vegetables, salads, or olives from the deli or salad bar at your store. Stock your pantry so you always have certain foods on hand, such as olive oil, canned tuna, canned tomatoes, rice, pasta, and beans. Cooking  Cook foods with extra-virgin olive oil instead of using butter or other vegetable oils. Have meat as a side dish, and have vegetables or grains as your main dish. This means having meat in small portions or adding small amounts of meat to foods like pasta or stew. Use beans or vegetables instead of meat in common dishes like chili or lasagna. Experiment with different cooking methods. Try roasting or broiling vegetables instead of steaming or sauteing them. Add frozen vegetables to soups, stews, pasta, or rice. Add nuts or seeds for added healthy fat at each meal. You can add these to yogurt, salads, or vegetable dishes. Marinate fish or vegetables using olive oil, lemon juice, garlic, and fresh herbs. Meal planning  Plan to eat 1 vegetarian meal one day each week. Try to work up to 2 vegetarian meals, if possible. Eat seafood 2 or more times a week. Have healthy snacks readily available, such as: Vegetable sticks with hummus. Greek yogurt. Fruit and nut trail mix. Eat balanced meals throughout the week. This includes: Fruit: 2-3 servings a day Vegetables: 4-5 servings a day Low-fat dairy: 2 servings a day Fish, poultry, or lean meat: 1 serving a day Beans and legumes: 2 or more servings a week Nuts and seeds: 1-2 servings a day Whole grains: 6-8 servings a day Extra-virgin olive oil: 3-4 servings a day Limit red meat and sweets to only a few servings a month What are my food choices? Mediterranean diet Recommended Grains: Whole-grain pasta. Brown rice. Bulgar wheat. Polenta. Couscous. Whole-wheat bread. Orpah Cobb. Vegetables:  Artichokes. Beets. Broccoli. Cabbage. Carrots. Eggplant. Green beans. Chard. Kale. Spinach. Onions. Leeks. Peas. Squash. Tomatoes. Peppers. Radishes. Fruits: Apples. Apricots. Avocado. Berries. Bananas. Cherries. Dates. Figs. Grapes. Lemons. Melon. Oranges. Peaches. Plums. Pomegranate. Meats and other protein foods: Beans. Almonds. Sunflower seeds. Pine nuts. Peanuts. Cod. Salmon. Scallops. Shrimp. Tuna. Tilapia. Clams. Oysters. Eggs. Dairy: Low-fat milk. Cheese. Greek yogurt. Beverages: Water. Red wine. Herbal tea. Fats and oils: Extra virgin olive oil. Avocado oil. Grape seed oil. Sweets and desserts: [place] yogurt with honey. Baked apples. Poached pears. Trail mix. Seasoning and other foods: Basil. Cilantro. Coriander. Cumin. Mint. Parsley. Sage. Rosemary. Tarragon. Garlic. Oregano. Thyme. Pepper. Balsalmic vinegar. Tahini. Hummus. Tomato sauce. Olives. Mushrooms. Limit these Grains: Prepackaged pasta or rice dishes. Prepackaged cereal with added sugar. Vegetables: Deep fried potatoes (french fries). Fruits: Fruit canned in syrup. Meats and other protein foods: Beef. Pork. Lamb. Poultry with skin. Hot dogs. Tomasa Blase. Dairy: Ice cream. Sour cream. Whole milk. Beverages: Juice. Sugar-sweetened soft drinks. Beer. Liquor and spirits. Fats and oils: Butter. Canola oil. Vegetable oil. Beef fat (tallow). Lard. Sweets and desserts: Cookies. Cakes. Pies. Candy. Seasoning and other  foods: Mayonnaise. Premade sauces and marinades. The items listed may not be a complete list. Talk with your dietitian about what dietary choices are right for you. Summary The Mediterranean diet includes both food and lifestyle choices. Eat a variety of fresh fruits and vegetables, beans, nuts, seeds, and whole grains. Limit the amount of red meat and sweets that you eat. Talk with your health care provider about whether it is safe for you to drink red wine in moderation. This means 1 glass a day for nonpregnant women and 2  glasses a day for men. A glass of wine equals 5 oz (150 mL). This information is not intended to replace advice given to you by your health care provider. Make sure you discuss any questions you have with your health care provider. Document Released: 09/07/2015 Document Revised: 10/10/2015 Document Reviewed: 09/07/2015 Elsevier Interactive Patient Education  2017 [employer].

## 2022-06-19 NOTE — Progress Notes (Signed)
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   Derek Blackburn is a very pleasant 87 y.o. RH male with a history of hypertension, hyperlipidemia,  DM2, anemia requiring Aranesp injections, likely due to CKD stage IIIb, history of prostate cancer, lumbar radiculopathy followed by orthopedics seen today in follow up to discuss the MRI of the brain results from 05/20/2022. These were personally reviewed, remarkable for advanced chronic small vessel ischemic disease with chronic lacunar infarcts, chronic cerebellar infarcts, hypertensive microangiopathy, moderate generalized atrophy, mild cerebellar atrophy, C3-C4 moderate canal stenosis with some flattening of the ventral spinal cord, and incidental 6 mm cyst within the posterior aspect of the pituitary gland-sella without significant mass effect or invasion of regional structures, without further imaging evaluation being necessary per guidelines. He was last seen on 05/20/2022, at which time his MoCA was 18/30. Patient is currently on donepezil 5 mg daily. All these findings, are concerning for memory impairment with vascular component, versus other etiology.  Neuropsychological evaluation will be recommended for clarity of this diagnosis. This patient is accompanied in the office by  who supplements the history.  Previous records as well as any outside records available were reviewed prior to todays visit.     Follow up after neuropsychological evaluation Increase donepezil to 10 mg daily, side effects discussed.  Neuropsychological evaluation for clarity of the diagnosis and disease trajectory is scheduled for Dec 2024 Continue to control mood as per PCP Continue to follow anemia  Continue to follow with orthopedics regarding chronic back pain and lumbar disease.  Latest lumbar steroid injection on 06/05/2022 Recommend referral to his Orthopedic provider (Dr. Alvester Morin)  for further evaluation of C3-C4 moderate canal stenosis  with some flattening of the ventral spinal cord, as he may need further imaging with a C-spine MRI Recommend good control of cardiovascular risk factors Continue to control mood as per PCP   Initial visit 05/20/2022 How long did patient have memory difficulties?  "Between 6-12 months ". He reports that his emory issues are vague, "I cannot pinpoint it, is kind of off". He does not do brain activities, although he likes reading and watching the news and sports on TV.  His PCP placed him on donepezil 5 mg daily, tolerating well (since March of this year).  He also takes, cannot oil, B12 and fish oil repeats oneself?  Endorsed, more frequently than before  Disoriented when walking into a room?  Patient denies  Leaving objects in unusual places?   denies   Wandering behavior? denies   Any personality changes since last visit? "Not a great change" Any history of depression?:  denies   Hallucinations or paranoia?  denies   Seizures? denies    Any sleep changes?   He goes to sleep right after dinner . Denies vivid dreams, REM behavior or sleepwalking   Sleep apnea? denies   Any hygiene concerns?  denies   Independent of bathing and dressing?  Endorsed  Does the patient need help with medications? Patient  is in charge   Who is in charge of the finances?  Both of them are  in charge     Any changes in appetite?   denies     Patient have trouble swallowing?  denies   Does the patient cook?  Wife does the cooking  Any headaches?  denies   Chronic back pain?  Endorsed.  He has significant bilateral lower back pain radiating to both legs, and chronic postlaminectomy syndrome, this is followed  by orthopedics, and is scheduled for possible injections.  He is also doing PT. Ambulates with difficulty? [facility] He no longer plays tennis due to back pain Recent falls or head injuries? denies     Vision changes? R decrease in vision, he attributes it to glaucoma Unilateral weakness,  numbness or tingling?  denies   Any tremors?  Denies  Any anosmia? Denies Any incontinence of urine? denies   Any bowel dysfunction?    denies      Patient lives with his wife   History of heavy alcohol intake? denies   History of heavy tobacco use? denies   Family history of dementia?   Denies   Does patient drive?Yes, uses GPS when needed. Denies getting disoriented    Pertinent labs latest CBC hemoglobin 9-hematocrit 29.4. 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   donepezil (ARICEPT) 5 MG tablet Take 5 mg by mouth daily.   hydrALAZINE (APRESOLINE) 50 MG tablet Take 50 mg by mouth 3 (three) times daily.   JARDIANCE 10 MG TABS tablet Take 10 mg by mouth daily.   latanoprost (XALATAN) 0.005 % ophthalmic solution Place 1 drop into both eyes at bedtime.   Multiple Vitamin (MULITIVITAMIN WITH MINERALS) TABS Take 1 tablet by mouth daily.   timolol (TIMOPTIC) 0.5 % ophthalmic solution 1 drop 2 (two) times daily.   trandolapril (MAVIK) 2 MG tablet Take 2 mg by mouth daily.   No facility-administered encounter medications on file as of 06/19/2022.        No data to display            05/20/2022   11:00 AM  Montreal Cognitive Assessment   Visuospatial/ Executive (0/5) 1  Naming (0/3) 3  Attention: Read list of digits (0/2) 2  Attention: Read list of letters (0/1) 1  Attention: Serial 7 subtraction starting at 100 (0/3) 1  Language: Repeat phrase (0/2) 2  Language : Fluency (0/1) 0  Abstraction (0/2) 2  Delayed Recall (0/5) 0  Orientation (0/6) 6  Total 18  Adjusted Score (based on education) 18   Thank you for allowing [place] the opportunity to participate in the care of this nice patient. Please do not hesitate to contact us  for any questions or concerns.   Total time spent on today's visit was 34 minutes dedicated to this patient today, preparing to see patient, examining the patient, ordering tests and/or medications and counseling the patient, documenting clinical information in the EHR or other health record, independently interpreting results and communicating results to the patient/family, discussing treatment and goals, answering patient's questions and coordinating care.  Cc:  Diamantina Providence, FNP  Marlowe Kays 06/19/2022 7:07 AM

## 2022-06-20 LAB — POCT HEMOGLOBIN-HEMACUE: Hemoglobin: 9.8 g/dL — ABNORMAL LOW (ref 13.0–17.0)

## 2022-06-27 ENCOUNTER — Telehealth: Payer: Self-pay | Admitting: Physical Medicine and Rehabilitation

## 2022-06-27 NOTE — Telephone Encounter (Signed)
Patient called. Would like to [facility] his appointment.

## 2022-06-28 NOTE — Telephone Encounter (Signed)
Patient called clinic and can make appointment for 07/01/22

## 2022-07-01 ENCOUNTER — Ambulatory Visit: Payer: Medicare Other | Admitting: Physical Medicine and Rehabilitation

## 2022-07-01 ENCOUNTER — Encounter: Payer: Self-pay | Admitting: Physical Medicine and Rehabilitation

## 2022-07-01 DIAGNOSIS — M5442 Lumbago with sciatica, left side: Secondary | ICD-10-CM | POA: Diagnosis not present

## 2022-07-01 DIAGNOSIS — M5416 Radiculopathy, lumbar region: Secondary | ICD-10-CM

## 2022-07-01 DIAGNOSIS — M5441 Lumbago with sciatica, right side: Secondary | ICD-10-CM

## 2022-07-01 DIAGNOSIS — M48062 Spinal stenosis, lumbar region with neurogenic claudication: Secondary | ICD-10-CM | POA: Diagnosis not present

## 2022-07-01 DIAGNOSIS — M961 Postlaminectomy syndrome, not elsewhere classified: Secondary | ICD-10-CM

## 2022-07-01 DIAGNOSIS — G8929 Other chronic pain: Secondary | ICD-10-CM

## 2022-07-01 NOTE — Progress Notes (Unsigned)
Derek Blackburn - 87 y.o. male MRN [id number]  Date of birth: 11-02-1935  Office Visit Note: Visit Date: 07/01/2022 PCP: Diamantina Providence, FNP Referred by: Diamantina Providence, FNP  Subjective: Chief Complaint  Patient presents with   Lower Back - Pain   HPI: Derek Blackburn is a 87 y.o. male who comes in today for evaluation of chronic bilateral lower back pain, intermittent radiation of pain down both posterolateral legs. Patient is poor historian, he has difficulty answering questions. He is here today in follow from recent right L3-L4 interlaminar epidural steroid injection performed on 06/05/2022.  Patient reports significant and sustained pain relief with this injection, greater than 50% relief of his pain, he also reports increased functional ability postinjection.  He is unable to verbalize current pain level as of today.  Pain has been ongoing for several years, feels this injection did help alleviate most of his severe discomfort.  Recent lumbar MRI imaging shows severe spinal canal stenosis at L2-L3, L3-L4 and L4-L5. History of prior laminectomy at the level of L4-L5 by Dr. Otelia Sergeant in 2000.  He continues to exercise daily and intends classes at the [facility].  States he would like to resume playing tennis.  Patient continues with Aricept he is being managed by Dr. Marlowe Kays with [facility].  Patient denies focal weakness, numbness and tingling.  No recent trauma or falls.   Review of Systems  Musculoskeletal:  Positive for back pain.  Neurological:  Negative for tingling, sensory change, focal weakness and weakness.   Otherwise per HPI.  Assessment & Plan: Visit Diagnoses:    ICD-10-CM   1. Lumbar radiculopathy  M54.16     2. Chronic bilateral low back pain with bilateral sciatica  M54.42    M54.41    G89.29     3. Spinal stenosis of lumbar region with neurogenic claudication  M48.062     4. Post laminectomy syndrome  M96.1        Plan: Findings:  Chronic  bilateral lower back pain, intermittent radiation of pain down both posterolateral legs.  Significant and sustained relief of pain with recent lumbar epidural steroid injection.  Patient continues with conservative therapies as needed.  If he continues to get good relief of pain with injection we can repeat this procedure infrequently as needed.  If pain relief does not seem to sustain with injection we can always look at transforaminal approach.  I encouraged patient to remain active as tolerated.  We will see him back in follow-up as needed.  No red flag symptoms noted upon exam today.    Meds & Orders: No orders of the defined types were placed in this encounter.  No orders of the defined types were placed in this encounter.   Follow-up: Return if symptoms worsen or fail to improve.   Procedures: No procedures performed      Clinical History: CLINICAL DATA:  Back pain. History of prostate cancer. History of back surgery.   EXAM: MRI LUMBAR SPINE WITHOUT CONTRAST   TECHNIQUE: Multiplanar, multisequence MR imaging of the lumbar spine was performed. No intravenous contrast was administered.   COMPARISON:  None Available.   FINDINGS: Segmentation:  Standard.   Alignment:  There is straightening of the normal lumbar lordosis.   Vertebrae: There is diffusely heterogeneous marrow signal throughout the lumbar spine in the sacrum. This may be related to a history of radiation therapy. Correlate with patient's history. There is osseous fusion L4-L5 and L5-S1.  Conus medullaris and cauda equina: Conus extends to the L1-L2 disc space level. Conus and cauda equina appear normal.   Paraspinal and other soft tissues: There are multiple T2 hyperintense renal lesions lesions are nonspecific, but favored to represent simple renal cysts. These require no further workup. Fatty atrophy of the paraspinal musculature.   Disc levels:   T12-L1: Severe left and moderate right facet  degenerative change. Eccentric right disc bulge. Mild overall spinal canal narrowing. Severe right neural foraminal narrowing.   L1-L2: Severe bilateral facet degenerative change. Eccentric left disc bulge. Moderate spinal canal narrowing. Severe bilateral neural foraminal narrowing.   L2-L3: Severe bilateral facet degenerative change. Short pedicles. Circumferential disc bulge. Severe spinal canal narrowing. Moderate left and mild right neural foraminal narrowing.   L3-L4: Severe bilateral facet degenerative change. Short pedicles. Circumferential disc bulge. Severe spinal canal narrowing. Moderate right and mild left neural foraminal narrowing.   L4-L5: Severe bilateral facet degenerative change. No spinal canal narrowing. No significant disc bulge. Moderate left and mild right neural foraminal narrowing.   L5-S1: Moderate bilateral facet degenerative change. No significant disc bulge. No spinal canal narrowing. Mild bilateral neural foraminal narrowing.   IMPRESSION: 1. Severe spinal canal narrowing at L2-L3, L3-L4, and L4-L5. 2. Severe neural foraminal narrowing at L1-L2 (bilateral) and T12-L1 (right). 3. Diffusely heterogeneous marrow signal throughout the lumbar spine and the sacrum. This may be related to a history of radiation therapy. Correlate with patient's history.     Electronically Signed   By: Lorenza Cambridge M.D.   On: 05/07/2022 14:00   He reports that he has never smoked. He has never used smokeless tobacco. No results for input(s): "HGBA1C", "LABURIC" in the last 8760 hours.  Objective:  VS:  HT:    WT:   BMI:     BP:   HR: bpm  TEMP: ( )  RESP:  Physical Exam Vitals and nursing note reviewed.  HENT:     Head: Normocephalic and atraumatic.     Right Ear: External ear normal.     Left Ear: External ear normal.     Nose: Nose normal.     Mouth/Throat:     Mouth: Mucous membranes are moist.  Eyes:     Extraocular Movements: Extraocular movements  intact.  Cardiovascular:     Rate and Rhythm: Normal rate.     Pulses: Normal pulses.  Pulmonary:     Effort: Pulmonary effort is normal.  Abdominal:     General: Abdomen is flat. There is no distension.  Musculoskeletal:        General: Tenderness present.     Cervical back: Normal range of motion.     Comments: Patient is slow to rise from seated position to standing. Good lumbar range of motion. No pain noted with facet loading. 5/5 strength noted with bilateral hip flexion, knee flexion/extension, ankle dorsiflexion/plantarflexion and EHL. No clonus noted bilaterally. No pain upon palpation of greater trochanters. No pain with internal/external rotation of bilateral hips. Sensation intact bilaterally. Ambulates without aid, gait steady.  Skin:    General: Skin is warm and dry.     Capillary Refill: Capillary refill takes less than 2 seconds.  Neurological:     Mental Status: He is alert.     Comments: He is alert and oriented, has difficulty answering specific questions.   Psychiatric:        Mood and Affect: Mood normal.        Behavior: Behavior normal.  Ortho Exam  Imaging: No results found.  Past Medical/Family/Surgical/Social History: Medications & Allergies reviewed per EMR, new medications updated. Patient Active Problem List   Diagnosis Date Noted   Memory impairment 06/19/2022   Neck pain 06/19/2022   History of anemia due to CKD 09/25/2021   Malignant neoplasm of prostate (HCC) 02/08/2020   Past Medical History:  Diagnosis Date   Anemia    CKD (chronic kidney disease)    Heart murmur    Hypertension    Hypertensive urgency    Peripheral polyneuropathy    Prostate cancer (HCC)    PVD (peripheral vascular disease) (HCC)    Family History  Problem Relation Age of Onset   Breast cancer Neg Hx    Colon cancer Neg Hx    Prostate cancer Neg Hx    Pancreatic cancer Neg Hx    Past Surgical History:  Procedure Laterality Date   BACK SURGERY      PROSTATE BIOPSY     Social History   Occupational History    Comment: retired   Occupation: Retired  Tobacco Use   Smoking status: Never   Smokeless tobacco: Never  Building services engineer Use: Never used  Substance and Sexual Activity   Alcohol use: Yes    Comment: occasionally   Drug use: No   Sexual activity: Yes

## 2022-07-01 NOTE — Progress Notes (Signed)
   07/01/22 1825  Fall Screening  Falls in the past year? 0  Number of falls in past year 0  Was there an injury with Fall? 0  Fall Risk Category Calculator 0  Fall Risk  Patient at Risk for Falls Due to No Fall Risks  Fall risk Follow up Falls evaluation completed;Education provided;Falls prevention discussed

## 2022-07-01 NOTE — Progress Notes (Unsigned)
Functional Pain Scale - descriptive words and definitions  Moderate (4)   Constantly aware of pain, can complete ADLs with modification/sleep marginally affected at times/passive distraction is of no use, but active distraction gives some relief. Moderate range order  Average Pain 5  Lower back pain. States he thinks the injection only helped a little

## 2022-07-03 ENCOUNTER — Encounter (HOSPITAL_COMMUNITY)
Admission: RE | Admit: 2022-07-03 | Discharge: 2022-07-03 | Disposition: A | Discharge status: Home / Self Care | Payer: Medicare Other | Source: Ambulatory Visit | Attending: Nephrology | Admitting: Nephrology

## 2022-07-03 VITALS — BP 172/97 | HR 71 | Temp 97.3°F | Resp 18

## 2022-07-03 DIAGNOSIS — Z862 Personal history of diseases of the blood and blood-forming organs and certain disorders involving the immune mechanism: Secondary | ICD-10-CM | POA: Insufficient documentation

## 2022-07-03 DIAGNOSIS — N189 Chronic kidney disease, unspecified: Secondary | ICD-10-CM | POA: Insufficient documentation

## 2022-07-03 LAB — POCT HEMOGLOBIN-HEMACUE: Hemoglobin: 11.1 g/dL — ABNORMAL LOW (ref 13.0–17.0)

## 2022-07-03 MED ORDER — DARBEPOETIN ALFA 40 MCG/0.4ML IJ SOSY
40.0000 ug | PREFILLED_SYRINGE | INTRAMUSCULAR | Status: DC
  Administered 2022-07-03: 40 ug via SUBCUTANEOUS

## 2022-07-03 MED ORDER — DARBEPOETIN ALFA 40 MCG/0.4ML IJ SOSY
PREFILLED_SYRINGE | INTRAMUSCULAR | Status: AC
  Filled 2022-07-03: qty 0.4

## 2022-07-17 ENCOUNTER — Inpatient Hospital Stay (HOSPITAL_COMMUNITY): Admission: RE | Admit: 2022-07-17 | Payer: Medicare Other | Source: Ambulatory Visit

## 2022-07-20 ENCOUNTER — Other Ambulatory Visit: Payer: Self-pay

## 2022-07-20 ENCOUNTER — Encounter (HOSPITAL_COMMUNITY): Payer: Self-pay

## 2022-07-20 ENCOUNTER — Emergency Department (HOSPITAL_COMMUNITY)
Admission: EM | Admit: 2022-07-20 | Discharge: 2022-07-20 | Disposition: A | Discharge status: Home / Self Care | Payer: Medicare Other | Attending: Emergency Medicine | Admitting: Emergency Medicine

## 2022-07-20 DIAGNOSIS — I10 Essential (primary) hypertension: Secondary | ICD-10-CM

## 2022-07-20 DIAGNOSIS — Z79899 Other long term (current) drug therapy: Secondary | ICD-10-CM | POA: Diagnosis not present

## 2022-07-20 NOTE — ED Triage Notes (Signed)
Patient arrives ambulatory with c/o high blood pressure.   Patient reports checked his BP this morning and the numbers were high.   Patient denies CP, SOB.

## 2022-07-20 NOTE — Discharge Instructions (Addendum)
Return for any problem.  As discussed, continue taking your current blood pressure medications.  On Monday you need to contact your regular doctor and let them know that your blood pressure has been running high.  They may need to adjust your medications.

## 2022-07-20 NOTE — ED Provider Notes (Signed)
[place] EMERGENCY DEPARTMENT AT [facility] Provider Note   CSN: [id number] Arrival date & time: 07/20/22  1018     History  Chief Complaint  Patient presents with   Hypertension    Derek Blackburn is a 87 y.o. male.  87 year old male with prior medical history detailed below presents for evaluation.  Patient with longstanding history of hypertension.  Patient reports full compliance with previously prescribed hypertensive medications.  Patient checked his blood pressure today and noted that it was elevated.  This gave him significant anxiety.  He denies any symptoms.  He specifically denies headache, nausea, vomiting, chest pain, shortness of breath, other complaint.   The history is provided by the patient and medical records.       Home Medications Prior to Admission medications   Medication Sig Start Date End Date Taking? Authorizing Provider  amLODipine (NORVASC) 10 MG tablet Take 1 tablet (10 mg total) by mouth daily. 06/03/22   Kathleene Hazel, MD  donepezil (ARICEPT) 10 MG tablet Take 1 tablet (10 mg total) by mouth daily. 06/19/22   Marcos Eke, PA-C  hydrALAZINE (APRESOLINE) 50 MG tablet Take 50 mg by mouth 3 (three) times daily.    [provider]  JARDIANCE 10 MG TABS tablet Take 10 mg by mouth daily. 04/11/22   [provider]  latanoprost (XALATAN) 0.005 % ophthalmic solution Place 1 drop into both eyes at bedtime. 11/18/19   [provider]  Multiple Vitamin (MULITIVITAMIN WITH MINERALS) TABS Take 1 tablet by mouth daily.    [provider]  timolol (TIMOPTIC) 0.5 % ophthalmic solution 1 drop 2 (two) times daily. 03/23/22   [provider]  trandolapril (MAVIK) 2 MG tablet Take 2 mg by mouth daily.    [provider]      Allergies    Patient has no known allergies.    Review of Systems   Review of Systems  All other systems reviewed and are negative.   Physical Exam Updated  Vital Signs BP (!) 154/114 (BP Location: Left Arm)   Pulse 75   Temp 97.8 F (36.6 C)   Resp 18   SpO2 98%  Physical Exam Vitals and nursing note reviewed.  Constitutional:      General: He is not in acute distress.    Appearance: Normal appearance. He is well-developed.  HENT:     Head: Normocephalic and atraumatic.  Eyes:     Conjunctiva/sclera: Conjunctivae normal.     Pupils: Pupils are equal, round, and reactive to light.  Cardiovascular:     Rate and Rhythm: Normal rate and regular rhythm.     Heart sounds: Normal heart sounds.  Pulmonary:     Effort: Pulmonary effort is normal. No respiratory distress.     Breath sounds: Normal breath sounds.  Abdominal:     General: There is no distension.     Palpations: Abdomen is soft.     Tenderness: There is no abdominal tenderness.  Musculoskeletal:        General: No deformity. Normal range of motion.     Cervical back: Normal range of motion and neck supple.  Skin:    General: Skin is warm and dry.  Neurological:     General: No focal deficit present.     Mental Status: He is alert and oriented to person, place, and time.     ED Results / Procedures / Treatments   Labs (all labs ordered are listed, but  only abnormal results are displayed) Labs Reviewed - No data to display  EKG None  Radiology No results found.  Procedures Procedures    Medications Ordered in ED Medications - No data to display  ED Course/ Medical Decision Making/ A&P                             Medical Decision Making   Medical Screen Complete  This patient presented to the ED with complaint of hypertension.  This complaint involves an extensive number of treatment options. The initial differential diagnosis includes, but is not limited to, chronic hypertension, etc.  This presentation is: Chronic, Self-Limited, Previously Undiagnosed, Uncertain Prognosis, Complicated, Systemic Symptoms, and Threat to Life/Bodily  Function  Patient with longstanding history of hypertension.  Patient is currently followed by his PCP and other outpatient providers for control of his hypertension.  He reports full compliance with previously prescribed medications.  Patient without suggestion or symptoms of endorgan damage related to his hypertension.  BP in triage was elevated above his baseline.  Repeat without intervention demonstrated systolic in the 150s.  Patient typically will have blood pressures in the 150 systolic on his prior clinic evaluations recently.  Recent outpatient clinic evaluations -including labs - are without suggestion of significant acute pathology related to his chronic hypertension.  Patient reassured by evaluation here in the ED.  Patient offered additional workup including screening labs.  He declined same.  Patient desires discharge home.  He does understand need for close follow-up with his PCP on Monday for continued control and evaluation of his blood pressure.  Strict return precautions given and understood.  Importance of close follow-up is stressed.  Additional history obtained:  External records from outside sources obtained and reviewed including prior ED visits and prior Inpatient records.  Problem List / ED Course:  Chronic hypertension   Reevaluation:  After the interventions noted above, I reevaluated the patient and found that they have: improved  Disposition:  After consideration of the diagnostic results and the patients response to treatment, I feel that the patent would benefit from close outpatient follow-up.          Final Clinical Impression(s) / ED Diagnoses Final diagnoses:  Hypertension, unspecified type    Rx / DC Orders ED Discharge Orders     None         Wynetta Fines, MD 07/20/22 [phone]

## 2022-07-31 ENCOUNTER — Ambulatory Visit (HOSPITAL_COMMUNITY)
Admission: RE | Admit: 2022-07-31 | Discharge: 2022-07-31 | Disposition: A | Discharge status: Home / Self Care | Payer: Medicare Other | Source: Ambulatory Visit | Attending: Nephrology | Admitting: Nephrology

## 2022-07-31 ENCOUNTER — Encounter (HOSPITAL_COMMUNITY): Payer: Medicare Other

## 2022-07-31 VITALS — BP 179/110 | HR 94 | Temp 97.3°F | Resp 17

## 2022-07-31 DIAGNOSIS — Z862 Personal history of diseases of the blood and blood-forming organs and certain disorders involving the immune mechanism: Secondary | ICD-10-CM | POA: Diagnosis present

## 2022-07-31 DIAGNOSIS — N189 Chronic kidney disease, unspecified: Secondary | ICD-10-CM | POA: Diagnosis not present

## 2022-07-31 LAB — IRON AND TIBC
Iron: 57 ug/dL (ref 45–182)
Saturation Ratios: 24 % (ref 17.9–39.5)
TIBC: 239 ug/dL — ABNORMAL LOW (ref 250–450)
UIBC: 182 ug/dL

## 2022-07-31 LAB — POCT HEMOGLOBIN-HEMACUE: Hemoglobin: 10.7 g/dL — ABNORMAL LOW (ref 13.0–17.0)

## 2022-07-31 LAB — FERRITIN: Ferritin: 466 ng/mL — ABNORMAL HIGH (ref 24–336)

## 2022-07-31 MED ORDER — DARBEPOETIN ALFA 40 MCG/0.4ML IJ SOSY
PREFILLED_SYRINGE | INTRAMUSCULAR | Status: AC
  Administered 2022-07-31: 40 ug via SUBCUTANEOUS
  Filled 2022-07-31: qty 0.4

## 2022-07-31 MED ORDER — DARBEPOETIN ALFA 40 MCG/0.4ML IJ SOSY: 40.0000 ug | PREFILLED_SYRINGE | INTRAMUSCULAR | Status: DC

## 2022-08-06 ENCOUNTER — Other Ambulatory Visit: Payer: Self-pay

## 2022-08-06 ENCOUNTER — Emergency Department (HOSPITAL_COMMUNITY): Payer: Medicare Other

## 2022-08-06 ENCOUNTER — Observation Stay (HOSPITAL_COMMUNITY)
Admission: EM | Admit: 2022-08-06 | Discharge: 2022-08-08 | Disposition: A | Discharge status: Home / Self Care | Payer: Medicare Other | Attending: Internal Medicine | Admitting: Internal Medicine

## 2022-08-06 ENCOUNTER — Encounter (HOSPITAL_COMMUNITY): Payer: Self-pay

## 2022-08-06 ENCOUNTER — Observation Stay (HOSPITAL_BASED_OUTPATIENT_CLINIC_OR_DEPARTMENT_OTHER): Payer: Medicare Other

## 2022-08-06 DIAGNOSIS — N289 Disorder of kidney and ureter, unspecified: Secondary | ICD-10-CM | POA: Diagnosis not present

## 2022-08-06 DIAGNOSIS — D649 Anemia, unspecified: Secondary | ICD-10-CM | POA: Diagnosis not present

## 2022-08-06 DIAGNOSIS — Z79899 Other long term (current) drug therapy: Secondary | ICD-10-CM | POA: Diagnosis not present

## 2022-08-06 DIAGNOSIS — R0609 Other forms of dyspnea: Secondary | ICD-10-CM | POA: Diagnosis not present

## 2022-08-06 DIAGNOSIS — Z8546 Personal history of malignant neoplasm of prostate: Secondary | ICD-10-CM | POA: Insufficient documentation

## 2022-08-06 DIAGNOSIS — R7989 Other specified abnormal findings of blood chemistry: Secondary | ICD-10-CM

## 2022-08-06 DIAGNOSIS — I70213 Atherosclerosis of native arteries of extremities with intermittent claudication, bilateral legs: Secondary | ICD-10-CM | POA: Insufficient documentation

## 2022-08-06 DIAGNOSIS — I130 Hypertensive heart and chronic kidney disease with heart failure and stage 1 through stage 4 chronic kidney disease, or unspecified chronic kidney disease: Secondary | ICD-10-CM | POA: Insufficient documentation

## 2022-08-06 DIAGNOSIS — R079 Chest pain, unspecified: Secondary | ICD-10-CM | POA: Diagnosis present

## 2022-08-06 DIAGNOSIS — C61 Malignant neoplasm of prostate: Secondary | ICD-10-CM | POA: Diagnosis present

## 2022-08-06 DIAGNOSIS — E1122 Type 2 diabetes mellitus with diabetic chronic kidney disease: Secondary | ICD-10-CM | POA: Insufficient documentation

## 2022-08-06 DIAGNOSIS — R0789 Other chest pain: Secondary | ICD-10-CM | POA: Diagnosis not present

## 2022-08-06 DIAGNOSIS — I10 Essential (primary) hypertension: Secondary | ICD-10-CM | POA: Diagnosis present

## 2022-08-06 DIAGNOSIS — N179 Acute kidney failure, unspecified: Secondary | ICD-10-CM | POA: Diagnosis not present

## 2022-08-06 DIAGNOSIS — I739 Peripheral vascular disease, unspecified: Secondary | ICD-10-CM

## 2022-08-06 DIAGNOSIS — N1832 Chronic kidney disease, stage 3b: Secondary | ICD-10-CM | POA: Insufficient documentation

## 2022-08-06 DIAGNOSIS — I129 Hypertensive chronic kidney disease with stage 1 through stage 4 chronic kidney disease, or unspecified chronic kidney disease: Secondary | ICD-10-CM | POA: Diagnosis not present

## 2022-08-06 DIAGNOSIS — R413 Other amnesia: Secondary | ICD-10-CM | POA: Diagnosis not present

## 2022-08-06 DIAGNOSIS — R2689 Other abnormalities of gait and mobility: Secondary | ICD-10-CM | POA: Insufficient documentation

## 2022-08-06 DIAGNOSIS — D631 Anemia in chronic kidney disease: Secondary | ICD-10-CM | POA: Diagnosis present

## 2022-08-06 DIAGNOSIS — N189 Chronic kidney disease, unspecified: Secondary | ICD-10-CM | POA: Diagnosis present

## 2022-08-06 DIAGNOSIS — E785 Hyperlipidemia, unspecified: Secondary | ICD-10-CM | POA: Diagnosis present

## 2022-08-06 HISTORY — DX: Other specified abnormal findings of blood chemistry: R79.89

## 2022-08-06 HISTORY — DX: Chest pain, unspecified: R07.9

## 2022-08-06 HISTORY — DX: Peripheral vascular disease, unspecified: I73.9

## 2022-08-06 HISTORY — DX: Atherosclerosis of native arteries of extremities with intermittent claudication, bilateral legs: I70.213

## 2022-08-06 LAB — COMPREHENSIVE METABOLIC PANEL
ALT: 16 U/L (ref 0–44)
AST: 23 U/L (ref 15–41)
Albumin: 3.2 g/dL — ABNORMAL LOW (ref 3.5–5.0)
Alkaline Phosphatase: 54 U/L (ref 38–126)
Anion gap: 10 (ref 5–15)
BUN: 36 mg/dL — ABNORMAL HIGH (ref 8–23)
CO2: 20 mmol/L — ABNORMAL LOW (ref 22–32)
Calcium: 9.3 mg/dL (ref 8.9–10.3)
Chloride: 110 mmol/L (ref 98–111)
Creatinine, Ser: 2.45 mg/dL — ABNORMAL HIGH (ref 0.61–1.24)
GFR, Estimated: 25 mL/min — ABNORMAL LOW (ref >60)
Glucose, Bld: 106 mg/dL — ABNORMAL HIGH (ref 70–99)
Potassium: 3.8 mmol/L (ref 3.5–5.1)
Sodium: 140 mmol/L (ref 135–145)
Total Bilirubin: 0.7 mg/dL (ref 0.3–1.2)
Total Protein: 7.4 g/dL (ref 6.5–8.1)

## 2022-08-06 LAB — ECHOCARDIOGRAM COMPLETE
AR max vel: 1.4 cm2
AV Area VTI: 1.44 cm2
AV Area mean vel: 1.31 cm2
AV Mean grad: 14 mmHg
AV Peak grad: 27.2 mmHg
Ao pk vel: 2.61 m/s
Height: 71 in
S' Lateral: 2.3 cm
Weight: 2640 oz

## 2022-08-06 LAB — CBC WITH DIFFERENTIAL/PLATELET
Abs Immature Granulocytes: 0.02 10*3/uL (ref 0.00–0.07)
Basophils Absolute: 0.1 10*3/uL (ref 0.0–0.1)
Basophils Relative: 1 %
Eosinophils Absolute: 0.3 10*3/uL (ref 0.0–0.5)
Eosinophils Relative: 4 %
HCT: 34.3 % — ABNORMAL LOW (ref 39.0–52.0)
Hemoglobin: 10.4 g/dL — ABNORMAL LOW (ref 13.0–17.0)
Immature Granulocytes: 0 %
Lymphocytes Relative: 15 %
Lymphs Abs: 1.2 10*3/uL (ref 0.7–4.0)
MCH: 29 pg (ref 26.0–34.0)
MCHC: 30.3 g/dL (ref 30.0–36.0)
MCV: 95.5 fL (ref 80.0–100.0)
Monocytes Absolute: 0.5 10*3/uL (ref 0.1–1.0)
Monocytes Relative: 7 %
Neutro Abs: 5.7 10*3/uL (ref 1.7–7.7)
Neutrophils Relative %: 73 %
Platelets: 244 10*3/uL (ref 150–400)
RBC: 3.59 MIL/uL — ABNORMAL LOW (ref 4.22–5.81)
RDW: 13.6 % (ref 11.5–15.5)
WBC: 7.8 10*3/uL (ref 4.0–10.5)
nRBC: 0 % (ref 0.0–0.2)

## 2022-08-06 LAB — TROPONIN I (HIGH SENSITIVITY)
Troponin I (High Sensitivity): 106 ng/L (ref <18)
Troponin I (High Sensitivity): 110 ng/L (ref <18)

## 2022-08-06 LAB — MAGNESIUM: Magnesium: 2 mg/dL (ref 1.7–2.4)

## 2022-08-06 LAB — BRAIN NATRIURETIC PEPTIDE: B Natriuretic Peptide: 1601.5 pg/mL — ABNORMAL HIGH (ref 0.0–100.0)

## 2022-08-06 LAB — CBG MONITORING, ED: Glucose-Capillary: 103 mg/dL — ABNORMAL HIGH (ref 70–99)

## 2022-08-06 LAB — LIPASE, BLOOD: Lipase: 49 U/L (ref 11–51)

## 2022-08-06 MED ORDER — EMPAGLIFLOZIN 10 MG PO TABS
10.0000 mg | ORAL_TABLET | Freq: Every day | ORAL | Status: DC
  Administered 2022-08-07 – 2022-08-08 (×2): 10 mg via ORAL
  Filled 2022-08-06 (×3): qty 1

## 2022-08-06 MED ORDER — ACETAMINOPHEN 325 MG PO TABS
650.0000 mg | ORAL_TABLET | Freq: Four times a day (QID) | ORAL | Status: DC | PRN
  Administered 2022-08-07 (×2): 650 mg via ORAL
  Filled 2022-08-06 (×3): qty 2

## 2022-08-06 MED ORDER — HYDRALAZINE HCL 50 MG PO TABS
50.0000 mg | ORAL_TABLET | Freq: Three times a day (TID) | ORAL | Status: DC
  Administered 2022-08-06 (×2): 50 mg via ORAL
  Filled 2022-08-06 (×2): qty 2

## 2022-08-06 MED ORDER — ACETAMINOPHEN 650 MG RE SUPP: 650.0000 mg | Freq: Four times a day (QID) | RECTAL | Status: DC | PRN

## 2022-08-06 MED ORDER — HYDRALAZINE HCL 50 MG PO TABS
50.0000 mg | ORAL_TABLET | Freq: Three times a day (TID) | ORAL | Status: DC
  Administered 2022-08-06 – 2022-08-07 (×4): 50 mg via ORAL
  Filled 2022-08-06 (×4): qty 1

## 2022-08-06 MED ORDER — HEPARIN BOLUS VIA INFUSION
4000.0000 [IU] | Freq: Once | INTRAVENOUS | Status: AC
  Administered 2022-08-06: 4000 [IU] via INTRAVENOUS
  Filled 2022-08-06: qty 4000

## 2022-08-06 MED ORDER — AMLODIPINE BESYLATE 10 MG PO TABS
10.0000 mg | ORAL_TABLET | Freq: Every day | ORAL | Status: DC
  Administered 2022-08-06 – 2022-08-08 (×3): 10 mg via ORAL
  Filled 2022-08-06: qty 2
  Filled 2022-08-06 (×2): qty 1

## 2022-08-06 MED ORDER — HEPARIN (PORCINE) 25000 UT/250ML-% IV SOLN
950.0000 [IU]/h | INTRAVENOUS | Status: DC
  Administered 2022-08-06: 900 [IU]/h via INTRAVENOUS
  Filled 2022-08-06: qty 250

## 2022-08-06 MED ORDER — SODIUM CHLORIDE 0.9 % IV BOLUS
1000.0000 mL | Freq: Once | INTRAVENOUS | Status: AC
  Administered 2022-08-06: 1000 mL via INTRAVENOUS

## 2022-08-06 MED ORDER — POLYETHYLENE GLYCOL 3350 17 G PO PACK: 17.0000 g | PACK | Freq: Every day | ORAL | Status: DC | PRN

## 2022-08-06 MED ORDER — DONEPEZIL HCL 10 MG PO TABS
10.0000 mg | ORAL_TABLET | Freq: Every day | ORAL | Status: DC
  Administered 2022-08-07 – 2022-08-08 (×2): 10 mg via ORAL
  Filled 2022-08-06 (×2): qty 1

## 2022-08-06 MED ORDER — CARVEDILOL 3.125 MG PO TABS
3.1250 mg | ORAL_TABLET | Freq: Two times a day (BID) | ORAL | Status: DC
  Administered 2022-08-06 – 2022-08-07 (×2): 3.125 mg via ORAL
  Filled 2022-08-06 (×2): qty 1

## 2022-08-06 MED ORDER — ENOXAPARIN SODIUM 30 MG/0.3ML IJ SOSY: 30.0000 mg | PREFILLED_SYRINGE | INTRAMUSCULAR | Status: DC

## 2022-08-06 NOTE — Progress Notes (Signed)
ANTICOAGULATION CONSULT NOTE - Initial Consult  Pharmacy Consult for Heparin Indication: chest pain/ACS  No Known Allergies  Patient Measurements: Height: 5\' 11"  (180.3 cm) Weight: 74.8 kg (165 lb) IBW/kg (Calculated) : 75.3 Heparin Dosing Weight: 74.8 kg  Vital Signs: Temp: 97.7 F (36.5 C) (07/09 1508) Temp Source: Oral (07/09 1508) BP: 181/131 (07/09 1500) Pulse Rate: 114 (07/09 1500)  Labs: Recent Labs    08/06/22 0900 08/06/22 1112  HGB 10.4*  --   HCT 34.3*  --   PLT 244  --   CREATININE 2.45*  --   TROPONINIHS 106* 110*    Estimated Creatinine Clearance: 23.3 mL/min (A) (by C-G formula based on SCr of 2.45 mg/dL (H)).   Medical History: Past Medical History:  Diagnosis Date   Anemia    CKD (chronic kidney disease)    Heart murmur    Hypertension    Hypertensive urgency    Peripheral polyneuropathy    Prostate cancer (HCC)    PVD (peripheral vascular disease) (HCC)     Assessment: 87 YOM w/ PMH sig for HTN, anemia, CKD, prostate cancer who presented w/ chest pain. No evidence of PTA anticoagulation. Hgb 10.4, plts 244. No signs/symptoms of bleeding.   Goal of Therapy:  Heparin level 0.3-0.7 units/ml Monitor platelets by anticoagulation protocol: Yes   Plan:  Give 4000 units bolus x 1 Start heparin infusion at 900 units/hr Continue to monitor H&H and platelets Check heparin level in 8-hours and daily   Laqueta Jean PharmD Candidate 08/06/2022 4:16 PM

## 2022-08-06 NOTE — ED Triage Notes (Addendum)
Pt BIB EMS due to sudden onset of chest pain this morning. EMS gave 324mg  of aspirin. C/O SHOB. Axox4. Hx of HTN

## 2022-08-06 NOTE — H&P (Addendum)
History and Physical   Derek Blackburn ZOX:[id number] DOB: [birth year] DOA: 08/06/2022  PCP: Diamantina Providence, FNP   Patient coming from: Home  Chief Complaint: Chest pain  HPI: Derek Blackburn is a 87 y.o. male with medical history significant of hypertension, hyperlipidemia, CKD 3B, PVD, prostate cancer, memory impairment, anemia presenting with chest pain.  Patient reports 3 days of intermittent shortness of breath and the new onset of chest discomfort today.  Not yet taking medications today.  Chest pain free in the ED.  Reports frequent urination at night. Denies fevers, chills, abdominal pain, constipation, diarrhea, nausea, vomiting.  ED Course: Vital signs in the ED notable for blood pressure in the 150s to 190 systolic, heart rate in the 100s to 110s.  Lab workup included CMP with bicarb 20, BUN 36, creatinine elevated to 2.45 baseline of 1.8, glucose 106, albumin 3.2.  CBC with hemoglobin stable at 10.4.  Troponin elevated at 106 and then 110 on repeat.  BNP elevated to greater than 1600.  Lipase negative.  Magnesium normal.  Patient received amlodipine, hydralazine as well as a liter of IV fluids in the ED.  Chest x-ray showed no acute normality but borderline cardiomegaly.  Cardiology consulted and will see the patient.  Review of Systems: As per HPI otherwise all other systems reviewed and are negative.  Past Medical History:  Diagnosis Date   Anemia    CKD (chronic kidney disease)    Heart murmur    Hypertension    Hypertensive urgency    Peripheral polyneuropathy    Prostate cancer (HCC)    PVD (peripheral vascular disease) (HCC)     Past Surgical History:  Procedure Laterality Date   BACK SURGERY     PROSTATE BIOPSY      Social History  reports that he has never smoked. He has never used smokeless tobacco. He reports current alcohol use. He reports that he does not use drugs.  No Known Allergies  Family History  Problem Relation Age of Onset    Breast cancer Neg Hx    Colon cancer Neg Hx    Prostate cancer Neg Hx    Pancreatic cancer Neg Hx   Reviewed on admission  Prior to Admission medications   Medication Sig Start Date End Date Taking? Authorizing Provider  amLODipine (NORVASC) 10 MG tablet Take 1 tablet (10 mg total) by mouth daily. 06/03/22   Kathleene Hazel, MD  donepezil (ARICEPT) 10 MG tablet Take 1 tablet (10 mg total) by mouth daily. 06/19/22   Marcos Eke, PA-C  hydrALAZINE (APRESOLINE) 50 MG tablet Take 50 mg by mouth 3 (three) times daily.    [provider]  JARDIANCE 10 MG TABS tablet Take 10 mg by mouth daily. 04/11/22   [provider]  latanoprost (XALATAN) 0.005 % ophthalmic solution Place 1 drop into both eyes at bedtime. 11/18/19   [provider]  Multiple Vitamin (MULITIVITAMIN WITH MINERALS) TABS Take 1 tablet by mouth daily.    [provider]  timolol (TIMOPTIC) 0.5 % ophthalmic solution 1 drop 2 (two) times daily. 03/23/22   [provider]  trandolapril (MAVIK) 2 MG tablet Take 2 mg by mouth daily.    [provider]    Physical Exam: Vitals:   08/06/22 1445 08/06/22 1500 08/06/22 1508 08/06/22 1613  BP: (!) 182/121 (!) 181/131  (!) 152/91  Pulse:  (!) 114    Resp: 20 (!) 29    Temp:   97.7  F (36.5 C)   TempSrc:   Oral   SpO2:  92%    Weight:      Height:        Physical Exam Constitutional:      General: He is not in acute distress.    Appearance: Normal appearance.  HENT:     Head: Normocephalic and atraumatic.     Mouth/Throat:     Mouth: Mucous membranes are moist.     Pharynx: Oropharynx is clear.  Eyes:     Extraocular Movements: Extraocular movements intact.     Pupils: Pupils are equal, round, and reactive to light.  Cardiovascular:     Rate and Rhythm: Regular rhythm. Tachycardia present.     Pulses: Normal pulses.     Heart sounds: Normal heart sounds.  Pulmonary:     Effort: Pulmonary effort is normal. No  respiratory distress.     Breath sounds: Rales present.  Abdominal:     General: Bowel sounds are normal. There is no distension.     Palpations: Abdomen is soft.     Tenderness: There is no abdominal tenderness.  Musculoskeletal:        General: No swelling or deformity.     Right lower leg: Edema present.     Left lower leg: Edema present.  Skin:    General: Skin is warm and dry.  Neurological:     General: No focal deficit present.     Mental Status: Mental status is at baseline.    Labs on Admission: I have personally reviewed following labs and imaging studies  CBC: Recent Labs  Lab 07/31/22 1153 08/06/22 0900  WBC  --  7.8  NEUTROABS  --  5.7  HGB 10.7* 10.4*  HCT  --  34.3*  MCV  --  95.5  PLT  --  244    Basic Metabolic Panel: Recent Labs  Lab 08/06/22 0900  NA 140  K 3.8  CL 110  CO2 20*  GLUCOSE 106*  BUN 36*  CREATININE 2.45*  CALCIUM 9.3  MG 2.0    GFR: Estimated Creatinine Clearance: 23.3 mL/min (A) (by C-G formula based on SCr of 2.45 mg/dL (H)).  Liver Function Tests: Recent Labs  Lab 08/06/22 0900  AST 23  ALT 16  ALKPHOS 54  BILITOT 0.7  PROT 7.4  ALBUMIN 3.2*    Urine analysis:    Component Value Date/Time   COLORURINE ORANGE (A) 01/21/2011 1720   APPEARANCEUR CLOUDY (A) 01/21/2011 1720   LABSPEC 1.025 01/21/2011 1720   PHURINE 5.0 01/21/2011 1720   GLUCOSEU NEGATIVE 01/21/2011 1720   HGBUR NEGATIVE 01/21/2011 1720   BILIRUBINUR SMALL (A) 01/21/2011 1720   KETONESUR 15 (A) 01/21/2011 1720   PROTEINUR 100 (A) 01/21/2011 1720   UROBILINOGEN 1.0 01/21/2011 1720   NITRITE NEGATIVE 01/21/2011 1720   LEUKOCYTESUR SMALL (A) 01/21/2011 1720    Radiological Exams on Admission: DG Chest Portable 1 View  Result Date: 08/06/2022 CLINICAL DATA:  No history provided EXAM: PORTABLE CHEST 1 VIEW COMPARISON:  CXR 07/20/21 FINDINGS: No pleural effusion. No pneumothorax. Borderline cardiomegaly unchanged. No radiographically apparent  displaced rib fractures. Visualized upper abdomen is unremarkable. IMPRESSION: No focal airspace opacity Electronically Signed   By: Lorenza Cambridge M.D.   On: 08/06/2022 10:03    EKG: Independently reviewed.  Sinus tachycardia at 100 bpm.  Nonspecific T wave changes.  Assessment/Plan Principal Problem:   Chest pain, rule out acute myocardial infarction Active Problems:   Malignant neoplasm  of prostate (HCC)   Memory impairment   Anemia   Essential hypertension   Hyperlipidemia   Peripheral vascular disease (HCC)   Elevated brain natriuretic peptide (BNP) level   Acute renal failure superimposed on stage 3b chronic kidney disease (HCC)   Chest pain, rule out MI ?CHF (elevated BNP) > Patient present with few days of intermittent shortness of breath and an episode of chest pain today. > Troponin mildly uptrending at 106 and then 110 on repeat.  BNP elevated to greater than 1600.  Chest x-ray with borderline cardiomegaly but otherwise no acute normalities.  Cardiology consulted in the ED. > Patient restarted on his home amlodipine and hydralazine in the ED and received a liter of IV fluids. Appear euvolemic, hold off on diuresis. - Appreciate cardio recommendations - Monitor on telemetry overnight - Trend troponin - Echocardiogram - Strict I's and O's Daily weights - Trend renal function and electrolytes - Heparin, ASA, Statin per Cards   AKI on CKD 3B > Creatinine elevated to 2.45 from baseline of 1.8 about a year ago. > Likely secondary to volume overload/CHF.  Appears euvolemic on exam. > Did receive a liter of fluids in the ED. - Monitor response to intial IVF for now. - Trend renal function and electrolytes   Hypertension - Continue home amlodipine, hydralazine - [place] home ACE inhibitor given AKI  Hyperlipidemia Peripheral vascular disease - No medications listed, will confirm with pharmacy  Memory impairment - Continue home donepezil  Anemia > Hemoglobin stable at  10.4 - Continue to trend CBC  DVT prophylaxis: Heparin Code Status:   Full Family Communication:  Updated at bedside  Disposition Plan:   Patient is from:  Home  Anticipated DC to:  Home  Anticipated DC date:  1 to 3 days  Anticipated DC barriers: None  Consults called:  Cardiology Admission status:  Observation, telemetry  Severity of Illness: The appropriate patient status for this patient is OBSERVATION. Observation status is judged to be reasonable and necessary in order to provide the required intensity of service to ensure the patient's safety. The patient's presenting symptoms, physical exam findings, and initial radiographic and laboratory data in the context of their medical condition is felt to place them at decreased risk for further clinical deterioration. Furthermore, it is anticipated that the patient will be medically stable for discharge from the hospital within 2 midnights of admission.    Synetta Fail MD [facility]  How to contact the [facility] Attending or Consulting provider 7A - 7P or covering provider during after hours 7P -7A, for this patient?   Check the care team in [facility] and look for a) attending/consulting [facility] provider listed and b) the [facility] team listed Log into [email or web address] and use [place]'s universal password to access. If you do not have the password, please contact the hospital operator. Locate the [facility] provider you are looking for under [facility] and page to a number that you can be directly reached. If you still have difficulty reaching the provider, please page the [facility] (Director on Call) for the Hospitalists listed on amion for assistance.  08/06/2022, 4:19 PM

## 2022-08-06 NOTE — ED Notes (Signed)
ED TO INPATIENT HANDOFF REPORT  ED Nurse Name and Phone #: Marcelino Duster [phone]  S Name/Age/Gender Derek Blackburn 87 y.o. male Room/Bed: 036C/036C  Code Status   Code Status: Full Code  Home/SNF/Other Home Patient oriented to: self, place, time, and situation Is this baseline? Yes   Triage Complete: Triage complete  Chief Complaint Chest pain, rule out acute myocardial infarction [R07.9]  Triage Note Pt BIB EMS due to sudden onset of chest pain this morning. EMS gave 324mg  of aspirin. C/O SHOB. Axox4. Hx of HTN    Allergies No Known Allergies  Level of Care/Admitting Diagnosis ED Disposition     ED Disposition  Admit   Condition  --   Comment  Hospital Area: [facility] [[id number]]  Level of Care: Telemetry Cardiac [[id number]]  May place patient in observation at [facility] or [facility] if equivalent level of care is available:: No  Covid Evaluation: Asymptomatic - no recent exposure (last 10 days) testing not required  Diagnosis: Chest pain, rule out acute myocardial infarction [725969]  Admitting Physician: Synetta Fail [[id number]]  Attending Physician: Synetta Fail [phone]          B Medical/Surgery History Past Medical History:  Diagnosis Date   Anemia    CKD (chronic kidney disease)    Heart murmur    Hypertension    Hypertensive urgency    Peripheral polyneuropathy    Prostate cancer (HCC)    PVD (peripheral vascular disease) (HCC)    Past Surgical History:  Procedure Laterality Date   BACK SURGERY     PROSTATE BIOPSY       A IV Location/Drains/Wounds Patient Lines/Drains/Airways Status     Active Line/Drains/Airways     Name Placement date Placement time Site Days   Peripheral IV 08/06/22 20 G Left Antecubital 08/06/22  0856  Antecubital  less than 1            Intake/Output Last 24 hours  Intake/Output Summary (Last 24 hours) at 08/06/2022 1755 Last data filed at 08/06/2022 1340 Gross per 24 hour   Intake 1000 ml  Output 150 ml  Net 850 ml    Labs/Imaging Results for orders placed or performed during the hospital encounter of 08/06/22 (from the past 48 hour(s))  Troponin I (High Sensitivity)     Status: Abnormal   Collection Time: 08/06/22  9:00 AM  Result Value Ref Range   Troponin I (High Sensitivity) 106 (HH) <18 ng/L    Comment: CRITICAL RESULT CALLED TO, READ BACK BY AND VERIFIED WITH CAMERON COBB RN.@1132  ON 7.9.24 BY TCALDWELL MT. (NOTE) Elevated high sensitivity troponin I (hsTnI) values and significant  changes across serial measurements may suggest ACS but many other  chronic and acute conditions are known to elevate hsTnI results.  Refer to the "Links" section for chest pain algorithms and additional  guidance. Performed at [facility], [address], [place], [place] [address]   Comprehensive metabolic panel     Status: Abnormal   Collection Time: 08/06/22  9:00 AM  Result Value Ref Range   Sodium 140 135 - 145 mmol/L   Potassium 3.8 3.5 - 5.1 mmol/L   Chloride 110 98 - 111 mmol/L   CO2 20 (L) 22 - 32 mmol/L   Glucose, Bld 106 (H) 70 - 99 mg/dL    Comment: Glucose reference range applies only to samples taken after fasting for at least 8 hours.   BUN 36 (H) 8 - 23  mg/dL   Creatinine, Ser 1.61 (H) 0.61 - 1.24 mg/dL   Calcium 9.3 8.9 - 09.6 mg/dL   Total Protein 7.4 6.5 - 8.1 g/dL   Albumin 3.2 (L) 3.5 - 5.0 g/dL   AST 23 15 - 41 U/L   ALT 16 0 - 44 U/L   Alkaline Phosphatase 54 38 - 126 U/L   Total Bilirubin 0.7 0.3 - 1.2 mg/dL   GFR, Estimated 25 (L) >60 mL/min    Comment: (NOTE) Calculated using the CKD-EPI Creatinine Equation (2021)    Anion gap 10 5 - 15    Comment: Performed at [facility], [address], [place], [place] [address]  Lipase, blood     Status: None   Collection Time: 08/06/22  9:00 AM  Result Value Ref Range   Lipase 49 11 - 51 U/L    Comment: Performed at [facility], [address], [place], [place]  [address]  Magnesium     Status: None   Collection Time: 08/06/22  9:00 AM  Result Value Ref Range   Magnesium 2.0 1.7 - 2.4 mg/dL    Comment: Performed at [facility], [address], [place], [place] [address]  Brain natriuretic peptide (order if patient c/o SOB ONLY)     Status: Abnormal   Collection Time: 08/06/22  9:00 AM  Result Value Ref Range   B Natriuretic Peptide 1,601.5 (H) 0.0 - 100.0 pg/mL    Comment: Performed at [facility], [address], [place], [place] [address]  CBC with Differential/Platelet     Status: Abnormal   Collection Time: 08/06/22  9:00 AM  Result Value Ref Range   WBC 7.8 4.0 - 10.5 K/uL   RBC 3.59 (L) 4.22 - 5.81 MIL/uL   Hemoglobin 10.4 (L) 13.0 - 17.0 g/dL   HCT 30.8 (L) 65.7 - 84.6 %   MCV 95.5 80.0 - 100.0 fL   MCH 29.0 26.0 - 34.0 pg   MCHC 30.3 30.0 - 36.0 g/dL   RDW 96.2 95.2 - 84.1 %   Platelets 244 150 - 400 K/uL   nRBC 0.0 0.0 - 0.2 %   Neutrophils Relative % 73 %   Neutro Abs 5.7 1.7 - 7.7 K/uL   Lymphocytes Relative 15 %   Lymphs Abs 1.2 0.7 - 4.0 K/uL   Monocytes Relative 7 %   Monocytes Absolute 0.5 0.1 - 1.0 K/uL   Eosinophils Relative 4 %   Eosinophils Absolute 0.3 0.0 - 0.5 K/uL   Basophils Relative 1 %   Basophils Absolute 0.1 0.0 - 0.1 K/uL   Immature Granulocytes 0 %   Abs Immature Granulocytes 0.02 0.00 - 0.07 K/uL    Comment: Performed at [facility], [address] [place], [place] [address]  CBG monitoring, ED     Status: Abnormal   Collection Time: 08/06/22  9:29 AM  Result Value Ref Range   Glucose-Capillary 103 (H) 70 - 99 mg/dL    Comment: Glucose reference range applies only to samples taken after fasting for at least 8 hours.  Troponin I (High Sensitivity)     Status: Abnormal   Collection Time: 08/06/22 11:12 AM  Result Value Ref Range   Troponin I (High Sensitivity) 110 (HH) <18 ng/L    Comment: CRITICAL VALUE NOTED. VALUE IS CONSISTENT WITH PREVIOUSLY REPORTED/CALLED  VALUE (NOTE) Elevated high sensitivity troponin I (hsTnI) values and significant  changes across serial measurements may suggest ACS  but many other  chronic and acute conditions are known to elevate hsTnI results.  Refer to the "Links" section for chest pain algorithms and additional  guidance. Performed at [facility], [address] [place], [place] [address]    ECHOCARDIOGRAM COMPLETE  Result Date: 08/06/2022    ECHOCARDIOGRAM REPORT   Patient Name:   Derek [facility] Date of Exam: 08/06/2022 Medical Rec #:  [id number]           Height:       71.0 in Accession #:    [id number]          Weight:       165.0 lb Date of Birth:  1935-11-05           BSA:          1.943 m Patient Age:    87 years            BP:           152/91 mmHg Patient Gender: M                   HR:           108 bpm. Exam Location:  Inpatient Procedure: 2D Echo, Color Doppler and Cardiac Doppler Indications:    chest pain. dyspnea  History:        Patient has prior history of Echocardiogram examinations, most                 recent 01/02/2022. PVD. chronic kidney disease.; Risk                 Factors:Dyslipidemia and Hypertension.  Sonographer:    Delcie Roch RDCS Referring Phys: [id number] Cecille Po MELVIN IMPRESSIONS  1. Left ventricular ejection fraction, by estimation, is 60 to 65%. The left ventricle has normal function. The left ventricle has no regional wall motion abnormalities. There is moderate concentric left ventricular hypertrophy. Left ventricular diastolic parameters are consistent with Grade I diastolic dysfunction (impaired relaxation).  2. Right ventricular systolic function is mildly reduced. The right ventricular size is normal. There is moderately elevated pulmonary artery systolic pressure. The estimated right ventricular systolic pressure is 51.4 mmHg.  3. Left atrial size was mildly dilated.  4. Right atrial size was mildly dilated.  5. The mitral valve is normal in structure. No evidence of  mitral valve regurgitation. No evidence of mitral stenosis.  6. Tricuspid valve regurgitation is mild to moderate.  7. Hard to see aortic leaflet morphology - possibly a bicuspid aortic valve. Doppler findings are consistent with low flow, low gradient moderate aortic stenosis. The aortic valve is abnormal. There is moderate calcification of the aortic valve. There is moderate thickening of the aortic valve. Aortic valve regurgitation is mild to moderate. Moderate aortic valve stenosis. Aortic valve mean gradient measures 14.0 mmHg. Aortic valve Vmax measures 2.61 m/s.  8. There is borderline dilatation of the ascending aorta, measuring 38 mm.  9. The inferior vena cava is normal in size with greater than 50% respiratory variability, suggesting right atrial pressure of 3 mmHg. Comparison(s): No significant change from prior study. Prior images reviewed side by side. FINDINGS  Left Ventricle: Left ventricular ejection fraction, by estimation, is 60 to 65%. The left ventricle has normal function. The left ventricle has no regional wall motion abnormalities. The left ventricular internal cavity size was normal in size. There is  moderate concentric left ventricular hypertrophy. Left ventricular diastolic parameters are consistent with  Grade I diastolic dysfunction (impaired relaxation). Normal left ventricular filling pressure. Right Ventricle: The right ventricular size is normal. No increase in right ventricular wall thickness. Right ventricular systolic function is mildly reduced. There is moderately elevated pulmonary artery systolic pressure. The tricuspid regurgitant velocity is 3.48 m/s, and with an assumed right atrial pressure of 3 mmHg, the estimated right ventricular systolic pressure is 51.4 mmHg. Left Atrium: Left atrial size was mildly dilated. Right Atrium: Right atrial size was mildly dilated. Pericardium: There is no evidence of pericardial effusion. Mitral Valve: The mitral valve is normal in  structure. No evidence of mitral valve regurgitation. No evidence of mitral valve stenosis. Tricuspid Valve: The tricuspid valve is normal in structure. Tricuspid valve regurgitation is mild to moderate. Aortic Valve: Hard to see aortic leaflet morphology - possibly a bicuspid aortic valve. Doppler findings are consistent with low flow, low gradient moderate aortic stenosis. The aortic valve is abnormal. There is moderate calcification of the aortic valve. There is moderate thickening of the aortic valve. Aortic valve regurgitation is mild to moderate. Moderate aortic stenosis is present. Aortic valve mean gradient measures 14.0 mmHg. Aortic valve peak gradient measures 27.2 mmHg. Aortic valve area,  by VTI measures 1.44 cm. Pulmonic Valve: The pulmonic valve was grossly normal. Pulmonic valve regurgitation is mild. No evidence of pulmonic stenosis. Aorta: The aortic root is normal in size and structure. There is borderline dilatation of the ascending aorta, measuring 38 mm. Venous: The inferior vena cava is normal in size with greater than 50% respiratory variability, suggesting right atrial pressure of 3 mmHg. IAS/Shunts: No atrial level shunt detected by color flow Doppler.  LEFT VENTRICLE PLAX 2D LVIDd:         3.40 cm   Diastology LVIDs:         2.30 cm   LV e' medial:    7.94 cm/s LV PW:         1.50 cm   LV E/e' medial:  6.8 LV IVS:        1.50 cm   LV e' lateral:   8.16 cm/s LVOT diam:     2.30 cm   LV E/e' lateral: 6.6 LV SV:         48 LV SV Index:   25 LVOT Area:     4.15 cm  RIGHT VENTRICLE            IVC RV Basal diam:  2.90 cm    IVC diam: 1.40 cm RV S prime:     9.46 cm/s TAPSE (M-mode): 1.1 cm LEFT ATRIUM             Index        RIGHT ATRIUM           Index LA diam:        3.30 cm 1.70 cm/m   RA Area:     19.30 cm LA Vol (A2C):   63.6 ml 32.73 ml/m  RA Volume:   53.40 ml  27.48 ml/m LA Vol (A4C):   35.2 ml 18.12 ml/m LA Biplane Vol: 48.1 ml 24.76 ml/m  AORTIC VALVE AV Area (Vmax):    1.40  cm AV Area (Vmean):   1.31 cm AV Area (VTI):     1.44 cm AV Vmax:           261.00 cm/s AV Vmean:          173.000 cm/s AV VTI:  0.334 m AV Peak Grad:      27.2 mmHg AV Mean Grad:      14.0 mmHg LVOT Vmax:         87.90 cm/s LVOT Vmean:        54.400 cm/s LVOT VTI:          0.115 m LVOT/AV VTI ratio: 0.35  AORTA Ao Root diam: 3.70 cm Ao Asc diam:  3.80 cm MV E velocity: 54.03 cm/s  TRICUSPID VALVE                            TR Peak grad:   48.4 mmHg                            TR Vmax:        348.00 cm/s                             SHUNTS                            Systemic VTI:  0.12 m                            Systemic Diam: 2.30 cm Rachelle Hora Croitoru MD Electronically signed by Thurmon Fair MD Signature Date/Time: 08/06/2022/5:55:02 PM    Final    DG Chest Portable 1 View  Result Date: 08/06/2022 CLINICAL DATA:  No history provided EXAM: PORTABLE CHEST 1 VIEW COMPARISON:  CXR 07/20/21 FINDINGS: No pleural effusion. No pneumothorax. Borderline cardiomegaly unchanged. No radiographically apparent displaced rib fractures. Visualized upper abdomen is unremarkable. IMPRESSION: No focal airspace opacity Electronically Signed   By: Lorenza Cambridge M.D.   On: 08/06/2022 10:03    Pending Labs Unresulted Labs (From admission, onward)     Start     Ordered   08/07/22 0500  Comprehensive metabolic panel  Tomorrow morning,   R        08/06/22 1539   08/07/22 0500  CBC  Tomorrow morning,   R        08/06/22 1539   08/07/22 0500  Heparin level (unfractionated)  Daily,   R      08/06/22 1623   08/07/22 0100  Heparin level (unfractionated)  Once-Timed,   TIMED        08/06/22 1623            Vitals/Pain Today's Vitals   08/06/22 1445 08/06/22 1500 08/06/22 1508 08/06/22 1613  BP: (!) 182/121 (!) 181/131  (!) 152/91  Pulse:  (!) 114    Resp: 20 (!) 29    Temp:   97.7 F (36.5 C)   TempSrc:   Oral   SpO2:  92%    Weight:      Height:      PainSc:        Isolation Precautions No active  isolations  Medications Medications  amLODipine (NORVASC) tablet 10 mg (10 mg Oral Given 08/06/22 0956)  hydrALAZINE (APRESOLINE) tablet 50 mg (50 mg Oral Given 08/06/22 1613)  donepezil (ARICEPT) tablet 10 mg (has no administration in time range)  empagliflozin (JARDIANCE) tablet 10 mg (has no administration in time range)  acetaminophen (TYLENOL) tablet 650 mg (has no administration in time range)    Or  acetaminophen (  TYLENOL) suppository 650 mg (has no administration in time range)  polyethylene glycol (MIRALAX / GLYCOLAX) packet 17 g (has no administration in time range)  heparin ADULT infusion 100 units/mL (25000 units/271mL) (has no administration in time range)  heparin bolus via infusion 4,000 Units (has no administration in time range)  sodium chloride 0.9 % bolus 1,000 mL (0 mLs Intravenous Stopped 08/06/22 1337)    Mobility walks     Focused Assessments Cardiac Assessment Handoff:    Lab Results  Component Value Date   TROPONINI <0.30 01/21/2011   No results found for: "DDIMER" Does the Patient currently have chest pain? No    R Recommendations: See Admitting Provider Note  Report given to:   Additional Notes:

## 2022-08-06 NOTE — ED Notes (Signed)
Patient returned from vascular.

## 2022-08-06 NOTE — ED Notes (Signed)
Cards eval   Gerhard Munch, MD 08/06/22 [phone]

## 2022-08-06 NOTE — ED Notes (Signed)
Pt up to bedside commode for bowel movement at this time with one person assist.

## 2022-08-06 NOTE — ED Provider Notes (Signed)
[place] EMERGENCY DEPARTMENT AT [facility] Provider Note   CSN: [id number] Arrival date & time: 08/06/22  7829     History  Chief Complaint  Patient presents with   Chest Pain    Derek Blackburn is a 87 y.o. male.  HPI Patient presents via EMS with concern for chest pain.  Patient is a generally well notes that over the past few days he has had episodic dyspnea and today had chest pain.  Currently no chest pain.  EMS reports the patient was awake, alert, tachycardic on their arrival.  He received aspirin prior to ED arrival. EMS rhythm strip notable for tachycardia, rate 110s, abnormal Patient denies history of cardiac disease, pulmonary disease, is a non-smoker, was getting ready to go to the gym today.    Home Medications Prior to Admission medications   Medication Sig Start Date End Date Taking? Authorizing Provider  amLODipine (NORVASC) 10 MG tablet Take 1 tablet (10 mg total) by mouth daily. 06/03/22   Kathleene Hazel, MD  donepezil (ARICEPT) 10 MG tablet Take 1 tablet (10 mg total) by mouth daily. 06/19/22   Marcos Eke, PA-C  hydrALAZINE (APRESOLINE) 50 MG tablet Take 50 mg by mouth 3 (three) times daily.    [provider]  JARDIANCE 10 MG TABS tablet Take 10 mg by mouth daily. 04/11/22   [provider]  latanoprost (XALATAN) 0.005 % ophthalmic solution Place 1 drop into both eyes at bedtime. 11/18/19   [provider]  Multiple Vitamin (MULITIVITAMIN WITH MINERALS) TABS Take 1 tablet by mouth daily.    [provider]  timolol (TIMOPTIC) 0.5 % ophthalmic solution 1 drop 2 (two) times daily. 03/23/22   [provider]  trandolapril (MAVIK) 2 MG tablet Take 2 mg by mouth daily.    [provider]      Allergies    Patient has no known allergies.    Review of Systems   Review of Systems  All other systems reviewed and are negative.   Physical Exam Updated Vital Signs BP (!) 162/102    Pulse (!) 106   Temp 97.7 F (36.5 C) (Oral)   Resp (!) 25   Ht 5\' 11"  (1.803 m)   Wt 74.8 kg   SpO2 97%   BMI 23.01 kg/m  Physical Exam Vitals and nursing note reviewed.  Constitutional:      General: He is not in acute distress.    Appearance: He is well-developed.  HENT:     Head: Normocephalic and atraumatic.  Eyes:     Conjunctiva/sclera: Conjunctivae normal.  Cardiovascular:     Rate and Rhythm: Normal rate and regular rhythm.  Pulmonary:     Effort: Pulmonary effort is normal. No respiratory distress.     Breath sounds: No stridor.  Abdominal:     General: There is no distension.  Skin:    General: Skin is warm and dry.  Neurological:     Mental Status: He is alert and oriented to person, place, and time.     ED Results / Procedures / Treatments   Labs (all labs ordered are listed, but only abnormal results are displayed) Labs Reviewed  COMPREHENSIVE METABOLIC PANEL - Abnormal; Notable for the following components:      Result Value   CO2 20 (*)    Glucose, Bld 106 (*)    BUN 36 (*)    Creatinine, Ser 2.45 (*)    Albumin 3.2 (*)  GFR, Estimated 25 (*)    All other components within normal limits  BRAIN NATRIURETIC PEPTIDE - Abnormal; Notable for the following components:   B Natriuretic Peptide 1,601.5 (*)    All other components within normal limits  CBC WITH DIFFERENTIAL/PLATELET - Abnormal; Notable for the following components:   RBC 3.59 (*)    Hemoglobin 10.4 (*)    HCT 34.3 (*)    All other components within normal limits  CBG MONITORING, ED - Abnormal; Notable for the following components:   Glucose-Capillary 103 (*)    All other components within normal limits  TROPONIN I (HIGH SENSITIVITY) - Abnormal; Notable for the following components:   Troponin I (High Sensitivity) 106 (*)    All other components within normal limits  TROPONIN I (HIGH SENSITIVITY) - Abnormal; Notable for the following components:   Troponin I (High Sensitivity) 110  (*)    All other components within normal limits  LIPASE, BLOOD  MAGNESIUM    EKG EKG Interpretation Date/Time:  Tuesday August 06 2022 09:24:53 EDT Ventricular Rate:  100 PR Interval:  226 QRS Duration:  77 QT Interval:  354 QTC Calculation: 457 R Axis:   -32  Text Interpretation: Sinus tachycardia Atrial premature complex Prolonged PR interval Left axis deviation Confirmed by Gerhard Munch [id number]) on 08/06/2022 9:35:43 AM  Radiology DG Chest Portable 1 View  Result Date: 08/06/2022 CLINICAL DATA:  No history provided EXAM: PORTABLE CHEST 1 VIEW COMPARISON:  CXR 07/20/21 FINDINGS: No pleural effusion. No pneumothorax. Borderline cardiomegaly unchanged. No radiographically apparent displaced rib fractures. Visualized upper abdomen is unremarkable. IMPRESSION: No focal airspace opacity Electronically Signed   By: Lorenza Cambridge M.D.   On: 08/06/2022 10:03    Procedures Procedures    Medications Ordered in ED Medications  amLODipine (NORVASC) tablet 10 mg (10 mg Oral Given 08/06/22 0956)  hydrALAZINE (APRESOLINE) tablet 50 mg (50 mg Oral Given 08/06/22 0956)  sodium chloride 0.9 % bolus 1,000 mL (0 mLs Intravenous Stopped 08/06/22 1337)    ED Course/ Medical Decision Making/ A&P                             Medical Decision Making Adult male without cardiac history presents with chest pain, dyspnea.  On arrival the patient is mildly tachycardic, hypertensive, though he has not yet taken his medication today. Differential includes ACS, less likely PE given his negligible risk profile. Patient started on monitor, x-ray, labs ordered. Cardiac 105 sinus tach abnormal Pulse ox 100% room air normal   Amount and/or Complexity of Data Reviewed Independent Historian: EMS Labs: ordered. Decision-making details documented in ED Course. Radiology: ordered and independent interpretation performed. Decision-making details documented in ED Course. ECG/medicine tests: ordered and independent  interpretation performed. Decision-making details documented in ED Course.  Risk Prescription drug management. Decision regarding hospitalization. Diagnosis or treatment significantly limited by social determinants of health.   Update: Patient accompanied by his wife.  I have reviewed initial labs with her, I have discussed the patient's case with her cardiology team and they eval the patient.  3:17 PM Patient's second troponin value increased from initial, though minimally so.  Each of these values is increased from his values earlier this year when the patient had echocardiogram performed. I reviewed his chart again, discussed that even with patient, wife, and given concern for elevated troponin, after new chest pain today, patient will require admission for further monitoring, management.  Thus far in  the ED the patient has remained chest pain-free, without new complaints.  With elevated troponin, elevated BNP, and worsening renal function patient is presentation is likely multifactorial, but with concern for these abnormalities in the context of a patient with dementia, he admitted for further monitoring, management.        Final Clinical Impression(s) / ED Diagnoses Final diagnoses:  Elevated troponin  Atypical chest pain  Renal dysfunction     Gerhard Munch, MD 08/06/22 [phone]

## 2022-08-06 NOTE — Progress Notes (Signed)
  Echocardiogram 2D Echocardiogram has been performed.  Delcie Roch 08/06/2022, 5:42 PM

## 2022-08-06 NOTE — Consult Note (Addendum)
Cardiology Consultation   Patient ID: [name] MRN: [id number]; DOB: 11/25/1935  Admit date: 08/06/2022 Date of Consult: 08/06/2022  PCP:  Diamantina Providence, FNP   [facility] Providers Cardiologist:  Verne Carrow, MD      Patient Profile:   Derek Blackburn is a 87 y.o. male with a hx of HTN, anemia, CKD, prostate CA  who is being seen 08/06/2022 for the evaluation of chest pain at the request of Dr. Jeraldine Loots.  History of Present Illness:   Derek Blackburn is an 87 yo male with PMH noted above. He has been seen by Dr. Clifton James as a new patient in the setting of cardiac murmur. He underwent echo 12/2021 with LVEF of 60-65%, mild aortic stenosis and mild AR. He was recently seen in follow up on 05/2022 and reported feeling well. Blood pressures were noted to be elevated and norvasc was increased to 10mg  daily.   He presented to the ED on 7/9 with complaints of chest pain. He is somewhat poor historian as baseline, unclear on timeline of events that brought him to the ED. Wife at the bedside gives most of the story. He reports checking his BP on a daily basis. Has been in his usual state of health until Sunday. Wife says they went to [facility] for breakfast, when walking back to the car he experienced shortness of breath and possibly some chest tightness. This morning, wife says he told her he wanted to go the ED. Had more shortness of breath and chest tightness.   Labs in the ED showed sodium 140, potassium 3.8, Cr 2.45, BNP 1601, hsTn 106>>110, WBC 7.8, Hgb 10.4. EKG showed sinus tachycardia, 100 bpm, PAC. CXR negative. Blood pressures elevated in the ED, reports he is normally complaint with his medications.  Denies any symptoms currently   Past Medical History:  Diagnosis Date   Anemia    CKD (chronic kidney disease)    Heart murmur    Hypertension    Hypertensive urgency    Peripheral polyneuropathy    Prostate cancer (HCC)    PVD (peripheral  vascular disease) (HCC)     Past Surgical History:  Procedure Laterality Date   BACK SURGERY     PROSTATE BIOPSY       Inpatient Medications: Scheduled Meds:  amLODipine  10 mg Oral Daily   [START ON 08/07/2022] donepezil  10 mg Oral Daily   [START ON 08/07/2022] empagliflozin  10 mg Oral Daily   enoxaparin (LOVENOX) injection  30 mg Subcutaneous Q24H   hydrALAZINE  50 mg Oral TID   Continuous Infusions:  PRN Meds: acetaminophen **OR** acetaminophen, polyethylene glycol  Allergies:   No Known Allergies  Social History:   Social History   Socioeconomic History   Marital status: Married    Spouse name: Not on file   Number of children: 3   Years of education: Not on file   Highest education level: Not on file  Occupational History    Comment: retired   Occupation: Retired  Tobacco Use   Smoking status: Never   Smokeless tobacco: Never  Building services engineer Use: Never used  Substance and Sexual Activity   Alcohol use: Yes    Comment: occasionally   Drug use: No   Sexual activity: Yes  Other Topics Concern   Not on file  Social History Narrative   2 sons, 1 daughter   Right handed   Drinks caffeine prn   Lives  with wife   One floor home   retired   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   All other ROS reviewed and negative.     Physical Exam/Data:   Vitals:   08/06/22 1430 08/06/22 1445 08/06/22 1500 08/06/22 1508  BP: (!) 176/104 (!) 182/121 (!) 181/131   Pulse: (!) 105  (!) 114   Resp: 18 20 (!) 29    Temp:    97.7 F (36.5 C)  TempSrc:    Oral  SpO2: 96%  92%   Weight:      Height:        Intake/Output Summary (Last 24 hours) at 08/06/2022 1558 Last data filed at 08/06/2022 1340 Gross per 24 hour  Intake 1000 ml  Output 150 ml  Net 850 ml      08/06/2022    8:54 AM 06/19/2022    1:15 PM 06/03/2022   10:05 AM  Last 3 Weights  Weight (lbs) 165 lb 158 lb 152 lb 6.4 oz  Weight (kg) 74.844 kg 71.668 kg 69.128 kg     Body mass index is 23.01 kg/m.  General:  Well nourished, well developed, in no acute distress HEENT: normal Neck: no JVD Vascular: No carotid bruits; Distal pulses 2+ bilaterally Cardiac:  normal S1, S2; RRR; + systolic murmur  Lungs:  clear to auscultation bilaterally, no wheezing, rhonchi or rales  Abd: soft, nontender, no hepatomegaly  Ext: no edema Musculoskeletal:  No deformities, BUE and BLE strength normal and equal Skin: warm and dry  Neuro:  CNs 2-12 intact, no focal abnormalities noted Psych:  Normal affect   EKG:  The EKG was personally reviewed and demonstrates:  Sinus Tachycardia, 100 bpm  Relevant CV Studies:  Echo: 12/2021  IMPRESSIONS     1. The aortic valve is tricuspid. There is moderate calcification of the  aortic valve. There is moderate thickening of the aortic valve. Aortic  valve regurgitation is mild. Mild aortic valve stenosis. Aortic valve  area, by VTI measures 2.00 cm. Aortic  valve mean gradient measures 16.0 mmHg. Aortic valve Vmax measures 2.50  m/s.   2. Left ventricular ejection fraction, by estimation, is 60 to 65%. The  left ventricle has normal function. The left ventricle has no regional  wall motion abnormalities. There is moderate concentric left ventricular  hypertrophy. Left ventricular  diastolic parameters are consistent with Grade I diastolic dysfunction  (impaired relaxation).   3. Right ventricular systolic function is normal. The right ventricular  size is normal. There is normal pulmonary artery  systolic pressure. The  estimated right ventricular systolic pressure is 30.7 mmHg.   4. The mitral valve is grossly normal. Trivial mitral valve  regurgitation. No evidence of mitral stenosis.   5. The inferior vena cava is normal in size with greater than 50%  respiratory variability, suggesting right atrial pressure of 3 mmHg.   FINDINGS   Left Ventricle: Left  ventricular ejection fraction, by estimation, is 60  to 65%. The left ventricle has normal function. The left ventricle has no  regional wall motion abnormalities. The left ventricular internal cavity  size was normal in size. There is   moderate concentric left ventricular hypertrophy. Left ventricular  diastolic parameters are consistent with Grade I diastolic dysfunction  (impaired relaxation).   Right Ventricle: The right ventricular size is normal. No increase in  right ventricular wall thickness. Right ventricular systolic function is  normal. There is normal pulmonary artery systolic pressure. The tricuspid  regurgitant velocity is 2.63 m/s, and   with an assumed right atrial pressure of 3 mmHg, the estimated right  ventricular systolic pressure is 30.7 mmHg.   Left Atrium: Left atrial size was normal in size.   Right Atrium: Right atrial size was normal in size.   Pericardium: There is no evidence of pericardial effusion.   Mitral Valve: The mitral valve is grossly normal. Trivial mitral valve  regurgitation. No evidence of mitral valve stenosis.   Tricuspid Valve: The tricuspid valve is grossly normal. Tricuspid valve  regurgitation is mild . No evidence of tricuspid stenosis.   Aortic Valve: The aortic valve is tricuspid. There is moderate  calcification of the aortic valve. There is moderate thickening of the  aortic valve. Aortic valve regurgitation is mild. Mild aortic stenosis is  present. Aortic valve mean gradient measures  16.0 mmHg. Aortic valve peak gradient measures 25.0 mmHg. Aortic valve  area,  by VTI measures 2.00 cm.   Pulmonic Valve: The pulmonic valve was grossly normal. Pulmonic valve  regurgitation is not visualized. No evidence of pulmonic stenosis.   Aorta: The aortic root and ascending aorta are structurally normal, with  no evidence of dilitation.   Venous: The inferior vena cava is normal in size with greater than 50%  respiratory variability, suggesting right atrial pressure of 3 mmHg.   IAS/Shunts: There is redundancy of the interatrial septum. The atrial  septum is grossly normal.    Laboratory Data:  High Sensitivity Troponin:   Recent Labs  Lab 08/06/22 0900 08/06/22 1112  TROPONINIHS 106* 110*     Chemistry Recent Labs  Lab 08/06/22 0900  NA 140  K 3.8  CL 110  CO2 20*  GLUCOSE 106*  BUN 36*  CREATININE 2.45*  CALCIUM 9.3  MG 2.0  GFRNONAA 25*  ANIONGAP 10    Recent Labs  Lab 08/06/22 0900  PROT 7.4  ALBUMIN 3.2*  AST 23  ALT 16  ALKPHOS 54  BILITOT 0.7   Lipids No results for input(s): "CHOL", "TRIG", "HDL", "LABVLDL", "LDLCALC", "CHOLHDL" in the last 168 hours.  Hematology Recent Labs  Lab 07/31/22 1153 08/06/22 0900  WBC  --  7.8  RBC  --  3.59*  HGB 10.7* 10.4*  HCT  --  34.3*  MCV  --  95.5  MCH  --  29.0  MCHC  --  30.3  RDW  --  13.6  PLT  --  244   Thyroid No results for input(s): "TSH", "FREET4" in the last 168 hours.  BNP Recent Labs  Lab 08/06/22 0900  BNP 1,601.5*    DDimer No results for input(s): "DDIMER" in the last 168 hours.   Radiology/Studies:  DG Chest Portable 1 View  Result Date: 08/06/2022 CLINICAL DATA:  No history provided EXAM: PORTABLE CHEST 1 VIEW COMPARISON:  CXR 07/20/21 FINDINGS: No pleural effusion. No pneumothorax. Borderline cardiomegaly unchanged. No radiographically apparent displaced rib fractures. Visualized  upper abdomen is unremarkable. IMPRESSION: No focal airspace opacity Electronically Signed   By: Lorenza Cambridge M.D.   On: 08/06/2022 10:03     Assessment and Plan:    Derek Blackburn is a 87 y.o. male with a hx of HTN, anemia, CKD, prostate CA  who is being seen 08/06/2022 for the evaluation of chest pain at the request of Dr. Jeraldine Loots.  Chest tightness Dyspnea NSTEMI vs demand ischemia -- presented with 2 separate episodes of chest tightness and shortness of breath. EKG without acute ST/T wave changes, hsTn 106>>110. BNP is elevated 1601,but CXR without overt edema and no signs of CHF on exam -- check echo -- suspect he will need an ischemic evaluation, though complicated situation with his renal disease. Follow up Cr tomorrow, along with echo for further recommendations -- will add IV heparin, ASA and continue statin   HTN -- blood pressures significantly elevated in the ED -- has been on norvasc and hydralazine PTA -- add coreg 3.125mg  BID  CKD IIIb w/AKI -- last Cr 06/2021 was 1.88, now 2.45 on admission -- not on any renal insulting agents PTA, was given 1L IVFs while in the ED. Hesitant to give additional IVFs at this time with elevated BNP -- follow BMET  Cognitive impairment -- on aricept  DM -- on jardiance PTA  Mild aortic stenosis  For questions or updates, please contact [facility] Please consult [email or web address] for contact info under    Signed, Derek Page, NP  08/06/2022 3:58 PM   Agree with note by Derek Page NP-C  We are asked to see this 87 year old thin-appearing married African-American male patient of Dr. Gibson Ramp who has a history of hypertension, anemia, and CKD for chest pain and shortness of breath.  He had a 2D echo performed 12/23 which essentially normal with mild aortic stenosis and mild AR.  He has noticed increased blood pressures at home according to his wife has had increasing dyspnea and some chest pain.  He is not a good historian.  On exam he does have an outflow tract murmur as well as an apical systolic murmur.  His troponins are mildly elevated but flat.  Strict his BNP is elevated  at 1600.  His EKG shows no acute changes and his chest x-ray is clear.  His serum creatinine was 2.45 in the past.  We will recheck a basic metabolic panel, as well as a 2D echo.  I suspect he will need diagnostic coronary angiography with limited dye and no left ventriculogram.  Runell Gess, M.D., FACP, [facility], [facility] [address] [place], [place]  [address]  [phone] 08/06/2022 4:44 PM

## 2022-08-07 ENCOUNTER — Encounter (HOSPITAL_COMMUNITY)
Admission: EM | Disposition: A | Discharge status: Home / Self Care | Payer: Self-pay | Source: Home / Self Care | Attending: Emergency Medicine

## 2022-08-07 DIAGNOSIS — N1832 Chronic kidney disease, stage 3b: Secondary | ICD-10-CM | POA: Diagnosis not present

## 2022-08-07 DIAGNOSIS — R079 Chest pain, unspecified: Secondary | ICD-10-CM | POA: Diagnosis not present

## 2022-08-07 DIAGNOSIS — N179 Acute kidney failure, unspecified: Secondary | ICD-10-CM | POA: Diagnosis not present

## 2022-08-07 DIAGNOSIS — I10 Essential (primary) hypertension: Secondary | ICD-10-CM | POA: Diagnosis not present

## 2022-08-07 HISTORY — PX: RIGHT/LEFT HEART CATH AND CORONARY ANGIOGRAPHY: CATH118266

## 2022-08-07 LAB — COMPREHENSIVE METABOLIC PANEL
ALT: 15 U/L (ref 0–44)
AST: 22 U/L (ref 15–41)
Albumin: 3.1 g/dL — ABNORMAL LOW (ref 3.5–5.0)
Alkaline Phosphatase: 55 U/L (ref 38–126)
Anion gap: 10 (ref 5–15)
BUN: 32 mg/dL — ABNORMAL HIGH (ref 8–23)
CO2: 16 mmol/L — ABNORMAL LOW (ref 22–32)
Calcium: 9.1 mg/dL (ref 8.9–10.3)
Chloride: 112 mmol/L — ABNORMAL HIGH (ref 98–111)
Creatinine, Ser: 1.99 mg/dL — ABNORMAL HIGH (ref 0.61–1.24)
GFR, Estimated: 32 mL/min — ABNORMAL LOW (ref >60)
Glucose, Bld: 97 mg/dL (ref 70–99)
Potassium: 3.9 mmol/L (ref 3.5–5.1)
Sodium: 138 mmol/L (ref 135–145)
Total Bilirubin: 0.6 mg/dL (ref 0.3–1.2)
Total Protein: 7.3 g/dL (ref 6.5–8.1)

## 2022-08-07 LAB — POCT I-STAT EG7
Acid-base deficit: 5 mmol/L — ABNORMAL HIGH (ref 0.0–2.0)
Acid-base deficit: 6 mmol/L — ABNORMAL HIGH (ref 0.0–2.0)
Bicarbonate: 18.2 mmol/L — ABNORMAL LOW (ref 20.0–28.0)
Bicarbonate: 18.3 mmol/L — ABNORMAL LOW (ref 20.0–28.0)
Calcium, Ion: 1.15 mmol/L (ref 1.15–1.40)
Calcium, Ion: 1.16 mmol/L (ref 1.15–1.40)
HCT: 31 % — ABNORMAL LOW (ref 39.0–52.0)
HCT: 32 % — ABNORMAL LOW (ref 39.0–52.0)
Hemoglobin: 10.5 g/dL — ABNORMAL LOW (ref 13.0–17.0)
Hemoglobin: 10.9 g/dL — ABNORMAL LOW (ref 13.0–17.0)
O2 Saturation: 63 %
O2 Saturation: 64 %
Potassium: 4 mmol/L (ref 3.5–5.1)
Potassium: 4.1 mmol/L (ref 3.5–5.1)
Sodium: 141 mmol/L (ref 135–145)
Sodium: 142 mmol/L (ref 135–145)
TCO2: 19 mmol/L — ABNORMAL LOW (ref 22–32)
TCO2: 19 mmol/L — ABNORMAL LOW (ref 22–32)
pCO2, Ven: 29.1 mmHg — ABNORMAL LOW (ref 44–60)
pCO2, Ven: 29.4 mmHg — ABNORMAL LOW (ref 44–60)
pH, Ven: 7.403 (ref 7.25–7.43)
pH, Ven: 7.404 (ref 7.25–7.43)
pO2, Ven: 32 mmHg (ref 32–45)
pO2, Ven: 33 mmHg (ref 32–45)

## 2022-08-07 LAB — CBC
HCT: 33.8 % — ABNORMAL LOW (ref 39.0–52.0)
Hemoglobin: 10.6 g/dL — ABNORMAL LOW (ref 13.0–17.0)
MCH: 30.2 pg (ref 26.0–34.0)
MCHC: 31.4 g/dL (ref 30.0–36.0)
MCV: 96.3 fL (ref 80.0–100.0)
Platelets: 226 10*3/uL (ref 150–400)
RBC: 3.51 MIL/uL — ABNORMAL LOW (ref 4.22–5.81)
RDW: 13.9 % (ref 11.5–15.5)
WBC: 7.6 10*3/uL (ref 4.0–10.5)
nRBC: 0 % (ref 0.0–0.2)

## 2022-08-07 LAB — POCT I-STAT 7, (LYTES, BLD GAS, ICA,H+H)
Acid-base deficit: 7 mmol/L — ABNORMAL HIGH (ref 0.0–2.0)
Bicarbonate: 16 mmol/L — ABNORMAL LOW (ref 20.0–28.0)
Calcium, Ion: 1.12 mmol/L — ABNORMAL LOW (ref 1.15–1.40)
HCT: 30 % — ABNORMAL LOW (ref 39.0–52.0)
Hemoglobin: 10.2 g/dL — ABNORMAL LOW (ref 13.0–17.0)
O2 Saturation: 94 %
Potassium: 4 mmol/L (ref 3.5–5.1)
Sodium: 142 mmol/L (ref 135–145)
TCO2: 17 mmol/L — ABNORMAL LOW (ref 22–32)
pCO2 arterial: 24 mmHg — ABNORMAL LOW (ref 32–48)
pH, Arterial: 7.431 (ref 7.35–7.45)
pO2, Arterial: 68 mmHg — ABNORMAL LOW (ref 83–108)

## 2022-08-07 LAB — HEPARIN LEVEL (UNFRACTIONATED): Heparin Unfractionated: 0.3 IU/mL (ref 0.30–0.70)

## 2022-08-07 SURGERY — RIGHT/LEFT HEART CATH AND CORONARY ANGIOGRAPHY
Anesthesia: LOCAL

## 2022-08-07 MED ORDER — LIDOCAINE HCL (PF) 1 % IJ SOLN
INTRAMUSCULAR | Status: DC | PRN
  Administered 2022-08-07 (×2): 2 mL via INTRADERMAL

## 2022-08-07 MED ORDER — SODIUM CHLORIDE 0.9% FLUSH: 3.0000 mL | INTRAVENOUS | Status: DC | PRN

## 2022-08-07 MED ORDER — LIDOCAINE HCL (PF) 1 % IJ SOLN
INTRAMUSCULAR | Status: AC
  Filled 2022-08-07: qty 30

## 2022-08-07 MED ORDER — SODIUM CHLORIDE 0.9 % IV SOLN: 250.0000 mL | INTRAVENOUS | Status: DC | PRN

## 2022-08-07 MED ORDER — SODIUM CHLORIDE 0.9 % WEIGHT BASED INFUSION
1.0000 mL/kg/h | INTRAVENOUS | Status: AC
  Administered 2022-08-07: 1 mL/kg/h via INTRAVENOUS

## 2022-08-07 MED ORDER — VERAPAMIL HCL 2.5 MG/ML IV SOLN
INTRAVENOUS | Status: AC
  Filled 2022-08-07: qty 2

## 2022-08-07 MED ORDER — SODIUM CHLORIDE 0.9% FLUSH: 3.0000 mL | Freq: Two times a day (BID) | INTRAVENOUS | Status: DC

## 2022-08-07 MED ORDER — VERAPAMIL HCL 2.5 MG/ML IV SOLN
INTRAVENOUS | Status: DC | PRN
  Administered 2022-08-07: 10 mL via INTRA_ARTERIAL

## 2022-08-07 MED ORDER — ENOXAPARIN SODIUM 30 MG/0.3ML IJ SOSY: 30.0000 mg | PREFILLED_SYRINGE | INTRAMUSCULAR | Status: DC

## 2022-08-07 MED ORDER — HEPARIN (PORCINE) IN NACL 1000-0.9 UT/500ML-% IV SOLN
INTRAVENOUS | Status: DC | PRN
  Administered 2022-08-07 (×2): 500 mL

## 2022-08-07 MED ORDER — IOHEXOL 350 MG/ML SOLN
INTRAVENOUS | Status: DC | PRN
  Administered 2022-08-07: 40 mL

## 2022-08-07 MED ORDER — SODIUM CHLORIDE 0.9 % WEIGHT BASED INFUSION
1.0000 mL/kg/h | INTRAVENOUS | Status: DC
  Administered 2022-08-07: 1 mL/kg/h via INTRAVENOUS

## 2022-08-07 MED ORDER — ASPIRIN 81 MG PO CHEW
81.0000 mg | CHEWABLE_TABLET | ORAL | Status: AC
  Administered 2022-08-07: 81 mg via ORAL
  Filled 2022-08-07: qty 1

## 2022-08-07 MED ORDER — ENOXAPARIN SODIUM 30 MG/0.3ML IJ SOSY
30.0000 mg | PREFILLED_SYRINGE | INTRAMUSCULAR | Status: DC
  Administered 2022-08-08: 30 mg via SUBCUTANEOUS
  Filled 2022-08-07: qty 0.3

## 2022-08-07 MED ORDER — HEPARIN SODIUM (PORCINE) 1000 UNIT/ML IJ SOLN
INTRAMUSCULAR | Status: AC
  Filled 2022-08-07: qty 10

## 2022-08-07 MED ORDER — SODIUM CHLORIDE 0.9 % WEIGHT BASED INFUSION
3.0000 mL/kg/h | INTRAVENOUS | Status: DC
  Administered 2022-08-07: 3 mL/kg/h via INTRAVENOUS

## 2022-08-07 MED ORDER — ASPIRIN 81 MG PO CHEW: 81.0000 mg | CHEWABLE_TABLET | ORAL | Status: DC

## 2022-08-07 MED ORDER — CARVEDILOL 6.25 MG PO TABS
6.2500 mg | ORAL_TABLET | Freq: Two times a day (BID) | ORAL | Status: DC
  Administered 2022-08-07 – 2022-08-08 (×2): 6.25 mg via ORAL
  Filled 2022-08-07 (×2): qty 1

## 2022-08-07 MED ORDER — HEPARIN SODIUM (PORCINE) 1000 UNIT/ML IJ SOLN
INTRAMUSCULAR | Status: DC | PRN
  Administered 2022-08-07: 3500 [IU] via INTRAVENOUS

## 2022-08-07 SURGICAL SUPPLY — 13 items
CATH 5FR JL3.5 JR4 ANG PIG MP (CATHETERS) IMPLANT
CATH BALLN WEDGE 5F 110CM (CATHETERS) IMPLANT
DEVICE RAD COMP TR BAND LRG (VASCULAR PRODUCTS) IMPLANT
GLIDESHEATH SLEND SS 6F .021 (SHEATH) IMPLANT
GUIDEWIRE INQWIRE 1.5J.035X260 (WIRE) IMPLANT
INQWIRE 1.5J .035X260CM (WIRE) ×1 IMPLANT
KIT HEART LEFT (KITS) ×1 IMPLANT
PACK CARDIAC CATHETERIZATION (CUSTOM PROCEDURE TRAY) ×1 IMPLANT
SHEATH GLIDE SLENDER 4/5FR (SHEATH) IMPLANT
SHEATH PROBE COVER 6X72 (BAG) IMPLANT
TRANSDUCER W/STOPCOCK (MISCELLANEOUS) ×1 IMPLANT
TUBING CIL FLEX 10 FLL-RA (TUBING) ×1 IMPLANT
WIRE HI TORQ VERSACORE-J 145CM (WIRE) IMPLANT

## 2022-08-07 NOTE — Progress Notes (Signed)
Heart Failure Navigator Progress Note  Assessed for Heart & Vascular TOC clinic readiness.  Patient does not meet criteria due to per MD cognitive impairment. .   Navigator will sign off at this time.   Rhae Hammock, BSN, Scientist, clinical (histocompatibility and immunogenetics) Only

## 2022-08-07 NOTE — Progress Notes (Signed)
PROGRESS NOTE  Derek Blackburn  ZOX:[id number] DOB: 11-18-1935 DOA: 08/06/2022 PCP: Diamantina Providence, FNP   Brief Narrative: Patient is a 87 year old male with history of hypertension, hyperlipidemia, CKD stage IIIb, peripheral vascular disease, prostate cancer, memory impairment who presented with chest pain, shortness of breath.  On presentation he was mildly hypertensive.  Labs showed creatinine of 2.45, mild elevated troponin, elevated BNP.  Cardiology consulted.  Plan for ischemic workup with right/left heart cath today.  Assessment & Plan:  Principal Problem:   Chest pain, rule out acute myocardial infarction Active Problems:   Malignant neoplasm of prostate (HCC)   Memory impairment   Anemia   Essential hypertension   Hyperlipidemia   Peripheral vascular disease (HCC)   Elevated brain natriuretic peptide (BNP) level   Acute renal failure superimposed on stage 3b chronic kidney disease (HCC)  Chest pain/rule out ACS: Presented with shortness of breath, chest pain.  Currently chest pain-free.  Elevated troponin with flat trend.  Elevated BNP.  EKG without acute ST/T wave changes.  Chest x-ray showed borderline cardiomegaly.  Currently chest pain-free.  Cardizem following.  Echo showed EF of 65%, no wall motion abnormality, grade 1 diastolic dysfunction.  Cardiology plan for right/left heart cath today.  Currently on heparin drip, aspirin, Coreg, statin  AKI in CKD stage IIIb: Baseline creatinine around 1.8.  Currently kidney function close to baseline.  Improved from admission.  Hypertension: On amlodipine, hydralazine, Coreg.  Monitor blood pressure  Hyperlipidemia/peripheral vascular disease: Continue home medications  Memory impairment: Continue home donepezil  Normocytic anemia: Currently hemoglobin stable.  Diabetes type 2: Jardiance  Mild to moderate aortic stenosis: As per echo.  Pending right/left heart cath.  Management as per cardiology  Debility/generalized  weakness/back pain: Will consult PT.  Lives with family.       DVT prophylaxis:iv heparin     Code Status: Full Code  Family Communication: None at bedside  Patient status:Inpatient  Patient is from :Home  Anticipated discharge WJ:XBJY  Estimated DC date:after full cardiology workup   Consultants: Cardiology  Procedures: None  Antimicrobials:  Anti-infectives (From admission, onward)    None       Subjective: Patient seen and examined the bedside today.  Hemodynamically stable.  Comfortable.  Denies chest pain.  Complains of some back pain.  Waiting for cardiac cath  Objective: Vitals:   08/07/22 0712 08/07/22 0807 08/07/22 1412 08/07/22 1441  BP: (!) 157/109 (!) 154/86    Pulse: 97 (!) 107    Resp:  16    Temp:      TempSrc:      SpO2: 94% 95% 94% 94%  Weight:      Height:        Intake/Output Summary (Last 24 hours) at 08/07/2022 1445 Last data filed at 08/07/2022 0810 Gross per 24 hour  Intake 125.66 ml  Output 500 ml  Net -374.34 ml   Filed Weights   08/06/22 0854 08/07/22 0440  Weight: 74.8 kg 67.9 kg    Examination:  General exam: Overall comfortable, not in distress HEENT: PERRL Respiratory system:  no wheezes or crackles  Cardiovascular system: S1 & S2 heard, RRR.  Gastrointestinal system: Abdomen is nondistended, soft and nontender. Central nervous system: Alert and oriented Extremities: No edema, no clubbing ,no cyanosis Skin: No rashes, no ulcers,no icterus     Data Reviewed: I have personally reviewed following labs and imaging studies  CBC: Recent Labs  Lab 08/06/22 0900 08/07/22 0054  WBC 7.8 7.6  NEUTROABS 5.7  --   HGB 10.4* 10.6*  HCT 34.3* 33.8*  MCV 95.5 96.3  PLT 244 226   Basic Metabolic Panel: Recent Labs  Lab 08/06/22 0900 08/07/22 0054  NA 140 138  K 3.8 3.9  CL 110 112*  CO2 20* 16*  GLUCOSE 106* 97  BUN 36* 32*  CREATININE 2.45* 1.99*  CALCIUM 9.3 9.1  MG 2.0  --      No results found for  this or any previous visit (from the past 240 hour(s)).   Radiology Studies: ECHOCARDIOGRAM COMPLETE  Result Date: 08/06/2022    ECHOCARDIOGRAM REPORT   Patient Name:   Derek Blackburn [facility] Date of Exam: 08/06/2022 Medical Rec #:  [id number]           Height:       71.0 in Accession #:    [id number]          Weight:       165.0 lb Date of Birth:  06-Sep-1935           BSA:          1.943 m Patient Age:    87 years            BP:           152/91 mmHg Patient Gender: M                   HR:           108 bpm. Exam Location:  Inpatient Procedure: 2D Echo, Color Doppler and Cardiac Doppler Indications:    chest pain. dyspnea  History:        Patient has prior history of Echocardiogram examinations, most                 recent 01/02/2022. PVD. chronic kidney disease.; Risk                 Factors:Dyslipidemia and Hypertension.  Sonographer:    Delcie Roch RDCS Referring Phys: [id number] Cecille Po MELVIN IMPRESSIONS  1. Left ventricular ejection fraction, by estimation, is 60 to 65%. The left ventricle has normal function. The left ventricle has no regional wall motion abnormalities. There is moderate concentric left ventricular hypertrophy. Left ventricular diastolic parameters are consistent with Grade I diastolic dysfunction (impaired relaxation).  2. Right ventricular systolic function is mildly reduced. The right ventricular size is normal. There is moderately elevated pulmonary artery systolic pressure. The estimated right ventricular systolic pressure is 51.4 mmHg.  3. Left atrial size was mildly dilated.  4. Right atrial size was mildly dilated.  5. The mitral valve is normal in structure. No evidence of mitral valve regurgitation. No evidence of mitral stenosis.  6. Tricuspid valve regurgitation is mild to moderate.  7. Hard to see aortic leaflet morphology - possibly a bicuspid aortic valve. Doppler findings are consistent with low flow, low gradient moderate aortic stenosis. The aortic valve is abnormal.  There is moderate calcification of the aortic valve. There is moderate thickening of the aortic valve. Aortic valve regurgitation is mild to moderate. Moderate aortic valve stenosis. Aortic valve mean gradient measures 14.0 mmHg. Aortic valve Vmax measures 2.61 m/s.  8. There is borderline dilatation of the ascending aorta, measuring 38 mm.  9. The inferior vena cava is normal in size with greater than 50% respiratory variability, suggesting right atrial pressure of 3 mmHg. Comparison(s): No significant change from prior study. Prior images reviewed side by side. FINDINGS  Left  Ventricle: Left ventricular ejection fraction, by estimation, is 60 to 65%. The left ventricle has normal function. The left ventricle has no regional wall motion abnormalities. The left ventricular internal cavity size was normal in size. There is  moderate concentric left ventricular hypertrophy. Left ventricular diastolic parameters are consistent with Grade I diastolic dysfunction (impaired relaxation). Normal left ventricular filling pressure. Right Ventricle: The right ventricular size is normal. No increase in right ventricular wall thickness. Right ventricular systolic function is mildly reduced. There is moderately elevated pulmonary artery systolic pressure. The tricuspid regurgitant velocity is 3.48 m/s, and with an assumed right atrial pressure of 3 mmHg, the estimated right ventricular systolic pressure is 51.4 mmHg. Left Atrium: Left atrial size was mildly dilated. Right Atrium: Right atrial size was mildly dilated. Pericardium: There is no evidence of pericardial effusion. Mitral Valve: The mitral valve is normal in structure. No evidence of mitral valve regurgitation. No evidence of mitral valve stenosis. Tricuspid Valve: The tricuspid valve is normal in structure. Tricuspid valve regurgitation is mild to moderate. Aortic Valve: Hard to see aortic leaflet morphology - possibly a bicuspid aortic valve. Doppler findings are  consistent with low flow, low gradient moderate aortic stenosis. The aortic valve is abnormal. There is moderate calcification of the aortic valve. There is moderate thickening of the aortic valve. Aortic valve regurgitation is mild to moderate. Moderate aortic stenosis is present. Aortic valve mean gradient measures 14.0 mmHg. Aortic valve peak gradient measures 27.2 mmHg. Aortic valve area,  by VTI measures 1.44 cm. Pulmonic Valve: The pulmonic valve was grossly normal. Pulmonic valve regurgitation is mild. No evidence of pulmonic stenosis. Aorta: The aortic root is normal in size and structure. There is borderline dilatation of the ascending aorta, measuring 38 mm. Venous: The inferior vena cava is normal in size with greater than 50% respiratory variability, suggesting right atrial pressure of 3 mmHg. IAS/Shunts: No atrial level shunt detected by color flow Doppler.  LEFT VENTRICLE PLAX 2D LVIDd:         3.40 cm   Diastology LVIDs:         2.30 cm   LV e' medial:    7.94 cm/s LV PW:         1.50 cm   LV E/e' medial:  6.8 LV IVS:        1.50 cm   LV e' lateral:   8.16 cm/s LVOT diam:     2.30 cm   LV E/e' lateral: 6.6 LV SV:         48 LV SV Index:   25 LVOT Area:     4.15 cm  RIGHT VENTRICLE            IVC RV Basal diam:  2.90 cm    IVC diam: 1.40 cm RV S prime:     9.46 cm/s TAPSE (M-mode): 1.1 cm LEFT ATRIUM             Index        RIGHT ATRIUM           Index LA diam:        3.30 cm 1.70 cm/m   RA Area:     19.30 cm LA Vol (A2C):   63.6 ml 32.73 ml/m  RA Volume:   53.40 ml  27.48 ml/m LA Vol (A4C):   35.2 ml 18.12 ml/m LA Biplane Vol: 48.1 ml 24.76 ml/m  AORTIC VALVE AV Area (Vmax):    1.40 cm AV Area (Vmean):  1.31 cm AV Area (VTI):     1.44 cm AV Vmax:           261.00 cm/s AV Vmean:          173.000 cm/s AV VTI:            0.334 m AV Peak Grad:      27.2 mmHg AV Mean Grad:      14.0 mmHg LVOT Vmax:         87.90 cm/s LVOT Vmean:        54.400 cm/s LVOT VTI:          0.115 m LVOT/AV VTI ratio:  0.35  AORTA Ao Root diam: 3.70 cm Ao Asc diam:  3.80 cm MV E velocity: 54.03 cm/s  TRICUSPID VALVE                            TR Peak grad:   48.4 mmHg                            TR Vmax:        348.00 cm/s                             SHUNTS                            Systemic VTI:  0.12 m                            Systemic Diam: 2.30 cm Rachelle Hora Croitoru MD Electronically signed by Thurmon Fair MD Signature Date/Time: 08/06/2022/5:55:02 PM    Final    DG Chest Portable 1 View  Result Date: 08/06/2022 CLINICAL DATA:  No history provided EXAM: PORTABLE CHEST 1 VIEW COMPARISON:  CXR 07/20/21 FINDINGS: No pleural effusion. No pneumothorax. Borderline cardiomegaly unchanged. No radiographically apparent displaced rib fractures. Visualized upper abdomen is unremarkable. IMPRESSION: No focal airspace opacity Electronically Signed   By: Lorenza Cambridge M.D.   On: 08/06/2022 10:03    Scheduled Meds:  [MAR Hold] amLODipine  10 mg Oral Daily   [MAR Hold] carvedilol  6.25 mg Oral BID WC   [MAR Hold] donepezil  10 mg Oral Daily   [MAR Hold] empagliflozin  10 mg Oral Daily   [MAR Hold] hydrALAZINE  50 mg Oral TID   [MAR Hold] sodium chloride flush  3 mL Intravenous Q12H   Continuous Infusions:  sodium chloride     sodium chloride 1 mL/kg/hr (08/07/22 1153)   heparin 950 Units/hr (08/07/22 0414)     LOS: 0 days   Burnadette Pop, MD [facility] P7/10/2022, 2:45 PM

## 2022-08-07 NOTE — Interval H&P Note (Signed)
History and Physical Interval Note:  08/07/2022 1:17 PM  [facility]  has presented today for surgery, with the diagnosis of chest pain.  The various methods of treatment have been discussed with the patient and family. After consideration of risks, benefits and other options for treatment, the patient has consented to  Procedure(s): RIGHT/LEFT HEART CATH AND CORONARY ANGIOGRAPHY (N/A) as a surgical intervention.  The patient's history has been reviewed, patient examined, no change in status, stable for surgery.  I have reviewed the patient's chart and labs.  Questions were answered to the patient's satisfaction.   Cath Lab Visit (complete for each Cath Lab visit)  Clinical Evaluation Leading to the Procedure:   ACS: Yes.    Non-ACS:    Anginal Classification: CCS III  Anti-ischemic medical therapy: Maximal Therapy (2 or more classes of medications)  Non-Invasive Test Results: No non-invasive testing performed  Prior CABG: No previous CABG        Theron Arista [facility] 08/07/2022 1:17 PM

## 2022-08-07 NOTE — Progress Notes (Addendum)
Rounding Note    Patient Name: Derek Blackburn Date of Encounter: 08/07/2022  [facility] Cardiologist: Verne Carrow, MD   Subjective   Feeling well, no chest pain and breathing is good.   Inpatient Medications    Scheduled Meds:  amLODipine  10 mg Oral Daily   carvedilol  3.125 mg Oral BID WC   donepezil  10 mg Oral Daily   empagliflozin  10 mg Oral Daily   hydrALAZINE  50 mg Oral TID   Continuous Infusions:  heparin 950 Units/hr (08/07/22 0414)   PRN Meds: acetaminophen **OR** acetaminophen, polyethylene glycol   Vital Signs    Vitals:   08/07/22 0412 08/07/22 0440 08/07/22 0712 08/07/22 0807  BP: (!) 163/110 (!) 169/94 (!) 157/109 (!) 154/86  Pulse: 80  97 (!) 107  Resp: 18   16  Temp:      TempSrc:      SpO2: 96%  94% 95%  Weight:  67.9 kg    Height:        Intake/Output Summary (Last 24 hours) at 08/07/2022 0935 Last data filed at 08/07/2022 0810 Gross per 24 hour  Intake 1125.66 ml  Output 650 ml  Net 475.66 ml      08/07/2022    4:40 AM 08/06/2022    8:54 AM 06/19/2022    1:15 PM  Last 3 Weights  Weight (lbs) 149 lb 9.6 oz 165 lb 158 lb  Weight (kg) 67.858 kg 74.844 kg 71.668 kg      Telemetry    Sinus rhythm - Personally Reviewed  ECG    No new tracing this morning  Physical Exam   GEN: No acute distress.   Neck: No JVD Cardiac: RRR, + systolic murmur, no rubs, or gallops.  Respiratory: Clear to auscultation bilaterally. GI: Soft, nontender, non-distended  MS: No edema; No deformity. Neuro:  Nonfocal  Psych: Normal affect   Labs    High Sensitivity Troponin:   Recent Labs  Lab 08/06/22 0900 08/06/22 1112  TROPONINIHS 106* 110*     Chemistry Recent Labs  Lab 08/06/22 0900 08/07/22 0054  NA 140 138  K 3.8 3.9  CL 110 112*  CO2 20* 16*  GLUCOSE 106* 97  BUN 36* 32*  CREATININE 2.45* 1.99*  CALCIUM 9.3 9.1  MG 2.0  --   PROT 7.4 7.3  ALBUMIN 3.2* 3.1*  AST 23 22  ALT 16 15  ALKPHOS 54 55   BILITOT 0.7 0.6  GFRNONAA 25* 32*  ANIONGAP 10 10    Lipids No results for input(s): "CHOL", "TRIG", "HDL", "LABVLDL", "LDLCALC", "CHOLHDL" in the last 168 hours.  Hematology Recent Labs  Lab 07/31/22 1153 08/06/22 0900 08/07/22 0054  WBC  --  7.8 7.6  RBC  --  3.59* 3.51*  HGB 10.7* 10.4* 10.6*  HCT  --  34.3* 33.8*  MCV  --  95.5 96.3  MCH  --  29.0 30.2  MCHC  --  30.3 31.4  RDW  --  13.6 13.9  PLT  --  244 226   Thyroid No results for input(s): "TSH", "FREET4" in the last 168 hours.  BNP Recent Labs  Lab 08/06/22 0900  BNP 1,601.5*    DDimer No results for input(s): "DDIMER" in the last 168 hours.   Radiology    ECHOCARDIOGRAM COMPLETE  Result Date: 08/06/2022    ECHOCARDIOGRAM REPORT   Patient Name:   Derek Blackburn [facility] Date of Exam: 08/06/2022 Medical Rec #:  [id number]  Height:       71.0 in Accession #:    [id number]          Weight:       165.0 lb Date of Birth:  03-Feb-1935           BSA:          1.943 m Patient Age:    87 years            BP:           152/91 mmHg Patient Gender: M                   HR:           108 bpm. Exam Location:  Inpatient Procedure: 2D Echo, Color Doppler and Cardiac Doppler Indications:    chest pain. dyspnea  History:        Patient has prior history of Echocardiogram examinations, most                 recent 01/02/2022. PVD. chronic kidney disease.; Risk                 Factors:Dyslipidemia and Hypertension.  Sonographer:    Delcie Roch RDCS Referring Phys: [id number] Cecille Po MELVIN IMPRESSIONS  1. Left ventricular ejection fraction, by estimation, is 60 to 65%. The left ventricle has normal function. The left ventricle has no regional wall motion abnormalities. There is moderate concentric left ventricular hypertrophy. Left ventricular diastolic parameters are consistent with Grade I diastolic dysfunction (impaired relaxation).  2. Right ventricular systolic function is mildly reduced. The right ventricular size is normal. There  is moderately elevated pulmonary artery systolic pressure. The estimated right ventricular systolic pressure is 51.4 mmHg.  3. Left atrial size was mildly dilated.  4. Right atrial size was mildly dilated.  5. The mitral valve is normal in structure. No evidence of mitral valve regurgitation. No evidence of mitral stenosis.  6. Tricuspid valve regurgitation is mild to moderate.  7. Hard to see aortic leaflet morphology - possibly a bicuspid aortic valve. Doppler findings are consistent with low flow, low gradient moderate aortic stenosis. The aortic valve is abnormal. There is moderate calcification of the aortic valve. There is moderate thickening of the aortic valve. Aortic valve regurgitation is mild to moderate. Moderate aortic valve stenosis. Aortic valve mean gradient measures 14.0 mmHg. Aortic valve Vmax measures 2.61 m/s.  8. There is borderline dilatation of the ascending aorta, measuring 38 mm.  9. The inferior vena cava is normal in size with greater than 50% respiratory variability, suggesting right atrial pressure of 3 mmHg. Comparison(s): No significant change from prior study. Prior images reviewed side by side. FINDINGS  Left Ventricle: Left ventricular ejection fraction, by estimation, is 60 to 65%. The left ventricle has normal function. The left ventricle has no regional wall motion abnormalities. The left ventricular internal cavity size was normal in size. There is  moderate concentric left ventricular hypertrophy. Left ventricular diastolic parameters are consistent with Grade I diastolic dysfunction (impaired relaxation). Normal left ventricular filling pressure. Right Ventricle: The right ventricular size is normal. No increase in right ventricular wall thickness. Right ventricular systolic function is mildly reduced. There is moderately elevated pulmonary artery systolic pressure. The tricuspid regurgitant velocity is 3.48 m/s, and with an assumed right atrial pressure of 3 mmHg, the  estimated right ventricular systolic pressure is 51.4 mmHg. Left Atrium: Left atrial size was mildly dilated. Right Atrium: Right atrial size was mildly  dilated. Pericardium: There is no evidence of pericardial effusion. Mitral Valve: The mitral valve is normal in structure. No evidence of mitral valve regurgitation. No evidence of mitral valve stenosis. Tricuspid Valve: The tricuspid valve is normal in structure. Tricuspid valve regurgitation is mild to moderate. Aortic Valve: Hard to see aortic leaflet morphology - possibly a bicuspid aortic valve. Doppler findings are consistent with low flow, low gradient moderate aortic stenosis. The aortic valve is abnormal. There is moderate calcification of the aortic valve. There is moderate thickening of the aortic valve. Aortic valve regurgitation is mild to moderate. Moderate aortic stenosis is present. Aortic valve mean gradient measures 14.0 mmHg. Aortic valve peak gradient measures 27.2 mmHg. Aortic valve area,  by VTI measures 1.44 cm. Pulmonic Valve: The pulmonic valve was grossly normal. Pulmonic valve regurgitation is mild. No evidence of pulmonic stenosis. Aorta: The aortic root is normal in size and structure. There is borderline dilatation of the ascending aorta, measuring 38 mm. Venous: The inferior vena cava is normal in size with greater than 50% respiratory variability, suggesting right atrial pressure of 3 mmHg. IAS/Shunts: No atrial level shunt detected by color flow Doppler.  LEFT VENTRICLE PLAX 2D LVIDd:         3.40 cm   Diastology LVIDs:         2.30 cm   LV e' medial:    7.94 cm/s LV PW:         1.50 cm   LV E/e' medial:  6.8 LV IVS:        1.50 cm   LV e' lateral:   8.16 cm/s LVOT diam:     2.30 cm   LV E/e' lateral: 6.6 LV SV:         48 LV SV Index:   25 LVOT Area:     4.15 cm  RIGHT VENTRICLE            IVC RV Basal diam:  2.90 cm    IVC diam: 1.40 cm RV S prime:     9.46 cm/s TAPSE (M-mode): 1.1 cm LEFT ATRIUM             Index        RIGHT  ATRIUM           Index LA diam:        3.30 cm 1.70 cm/m   RA Area:     19.30 cm LA Vol (A2C):   63.6 ml 32.73 ml/m  RA Volume:   53.40 ml  27.48 ml/m LA Vol (A4C):   35.2 ml 18.12 ml/m LA Biplane Vol: 48.1 ml 24.76 ml/m  AORTIC VALVE AV Area (Vmax):    1.40 cm AV Area (Vmean):   1.31 cm AV Area (VTI):     1.44 cm AV Vmax:           261.00 cm/s AV Vmean:          173.000 cm/s AV VTI:            0.334 m AV Peak Grad:      27.2 mmHg AV Mean Grad:      14.0 mmHg LVOT Vmax:         87.90 cm/s LVOT Vmean:        54.400 cm/s LVOT VTI:          0.115 m LVOT/AV VTI ratio: 0.35  AORTA Ao Root diam: 3.70 cm Ao Asc diam:  3.80 cm MV E velocity: 54.03 cm/s  TRICUSPID VALVE                            TR Peak grad:   48.4 mmHg                            TR Vmax:        348.00 cm/s                             SHUNTS                            Systemic VTI:  0.12 m                            Systemic Diam: 2.30 cm Rachelle Hora Croitoru MD Electronically signed by Thurmon Fair MD Signature Date/Time: 08/06/2022/5:55:02 PM    Final    DG Chest Portable 1 View  Result Date: 08/06/2022 CLINICAL DATA:  No history provided EXAM: PORTABLE CHEST 1 VIEW COMPARISON:  CXR 07/20/21 FINDINGS: No pleural effusion. No pneumothorax. Borderline cardiomegaly unchanged. No radiographically apparent displaced rib fractures. Visualized upper abdomen is unremarkable. IMPRESSION: No focal airspace opacity Electronically Signed   By: Lorenza Cambridge M.D.   On: 08/06/2022 10:03    Cardiac Studies   Echo: 08/06/2022  IMPRESSIONS     1. Left ventricular ejection fraction, by estimation, is 60 to 65%. The  left ventricle has normal function. The left ventricle has no regional  wall motion abnormalities. There is moderate concentric left ventricular  hypertrophy. Left ventricular  diastolic parameters are consistent with Grade I diastolic dysfunction  (impaired relaxation).   2. Right ventricular systolic function is mildly reduced. The right   ventricular size is normal. There is moderately elevated pulmonary artery  systolic pressure. The estimated right ventricular systolic pressure is  51.4 mmHg.   3. Left atrial size was mildly dilated.   4. Right atrial size was mildly dilated.   5. The mitral valve is normal in structure. No evidence of mitral valve  regurgitation. No evidence of mitral stenosis.   6. Tricuspid valve regurgitation is mild to moderate.   7. Hard to see aortic leaflet morphology - possibly a bicuspid aortic  valve. Doppler findings are consistent with low flow, low gradient  moderate aortic stenosis. The aortic valve is abnormal. There is moderate  calcification of the aortic valve. There  is moderate thickening of the aortic valve. Aortic valve regurgitation is  mild to moderate. Moderate aortic valve stenosis. Aortic valve mean  gradient measures 14.0 mmHg. Aortic valve Vmax measures 2.61 m/s.   8. There is borderline dilatation of the ascending aorta, measuring 38  mm.   9. The inferior vena cava is normal in size with greater than 50%  respiratory variability, suggesting right atrial pressure of 3 mmHg.   Comparison(s): No significant change from prior study. Prior images  reviewed side by side.   FINDINGS   Left Ventricle: Left ventricular ejection fraction, by estimation, is 60  to 65%. The left ventricle has normal function. The left ventricle has no  regional wall motion abnormalities. The left ventricular internal cavity  size was normal in size. There is   moderate concentric left ventricular hypertrophy. Left ventricular  diastolic parameters are consistent with Grade I  diastolic dysfunction  (impaired relaxation). Normal left ventricular filling pressure.   Right Ventricle: The right ventricular size is normal. No increase in  right ventricular wall thickness. Right ventricular systolic function is  mildly reduced. There is moderately elevated pulmonary artery systolic  pressure. The  tricuspid regurgitant  velocity is 3.48 m/s, and with an assumed right atrial pressure of 3 mmHg,  the estimated right ventricular systolic pressure is 51.4 mmHg.   Left Atrium: Left atrial size was mildly dilated.   Right Atrium: Right atrial size was mildly dilated.   Pericardium: There is no evidence of pericardial effusion.   Mitral Valve: The mitral valve is normal in structure. No evidence of  mitral valve regurgitation. No evidence of mitral valve stenosis.   Tricuspid Valve: The tricuspid valve is normal in structure. Tricuspid  valve regurgitation is mild to moderate.   Aortic Valve: Hard to see aortic leaflet morphology - possibly a bicuspid  aortic valve. Doppler findings are consistent with low flow, low gradient  moderate aortic stenosis. The aortic valve is abnormal. There is moderate  calcification of the aortic  valve. There is moderate thickening of the aortic valve. Aortic valve  regurgitation is mild to moderate. Moderate aortic stenosis is present.  Aortic valve mean gradient measures 14.0 mmHg. Aortic valve peak gradient  measures 27.2 mmHg. Aortic valve area,   by VTI measures 1.44 cm.   Pulmonic Valve: The pulmonic valve was grossly normal. Pulmonic valve  regurgitation is mild. No evidence of pulmonic stenosis.   Aorta: The aortic root is normal in size and structure. There is  borderline dilatation of the ascending aorta, measuring 38 mm.   Venous: The inferior vena cava is normal in size with greater than 50%  respiratory variability, suggesting right atrial pressure of 3 mmHg.   IAS/Shunts: No atrial level shunt detected by color flow Doppler.   Patient Profile     87 y.o. male with a hx of HTN, anemia, CKD, prostate CA  who is being seen 08/06/2022 for the evaluation of chest pain at the request of Dr. Jeraldine Loots.   Assessment & Plan    Chest tightness Dyspnea NSTEMI vs demand ischemia -- presented with 2 separate episodes of chest tightness and  shortness of breath. EKG without acute ST/T wave changes, hsTn 106>>110. BNP is elevated 1601,but CXR without overt edema and no signs of CHF on exam -- he will need ischemic evaluation, Cr has improved this morning. Will plan for [facility] today -- continue IV heparin, ASA, coreg and continue statin    HTN -- blood pressures not well controlled -- has been on norvasc and hydralazine PTA -- increase coreg to 6.25mg  BID    CKD IIIb w/AKI -- last Cr 06/2021 was 1.88, now 2.45 on admission, now improved to 1.99 -- appears he was on a ACE prior to admission, would continue to hold -- follow BMET   Cognitive impairment -- on aricept   DM -- on jardiance PTA   Mild-moderate aortic stenosis -- echo with doppler findings are consistent with low flow, low gradient  moderate aortic stenosis -- Moderate aortic valve stenosis. Aortic valve mean  gradient measures 14.0 mmHg. Aortic valve Vmax measures 2.61 m/s.  -- plan for [facility] today  Informed Consent   Shared Decision Making/Informed Consent{  The risks [stroke (1 in 1000), death (1 in 1000), kidney failure [usually temporary] (1 in 500), bleeding (1 in 200), allergic reaction [possibly serious] (1 in 200)], benefits (diagnostic support and management  of coronary artery disease) and alternatives of a cardiac catheterization were discussed in detail with Mr. Mapps and he is willing to proceed.    For questions or updates, please contact [facility] Please consult [email or web address] for contact info under        Signed, Laverda Page, NP  08/07/2022, 9:35 AM    Agree with note by Laverda Page NP-C  Patient mated with unstable angina.  His troponins were elevated but flat.  His serum creatinine has dropped down to baseline although continues to be moderately elevated.  2D echo revealed normal LV function with moderate aortic stenosis.  He said no recurrent symptoms.  He is scheduled for right/ left heart cath later  today.  Runell Gess, M.D., FACP, [facility], [facility] [address] [place], [place]  [address]  [phone] 08/07/2022 10:21 AM   Addendum: Cardiac cath revealed no evidence of obstructive disease.  LVEDP was normal.  He had mild aortic stenosis.  Anticipate discharge tomorrow.  Runell Gess, M.D., FACP, [facility], [facility] [address] [place], [place]  [address]  [phone] 08/07/2022 4:18 PM

## 2022-08-07 NOTE — H&P (View-Only) (Signed)
Rounding Note    Patient Name: Derek Blackburn Date of Encounter: 08/07/2022  [facility] Cardiologist: Verne Carrow, MD   Subjective   Feeling well, no chest pain and breathing is good.   Inpatient Medications    Scheduled Meds:  amLODipine  10 mg Oral Daily   carvedilol  3.125 mg Oral BID WC   donepezil  10 mg Oral Daily   empagliflozin  10 mg Oral Daily   hydrALAZINE  50 mg Oral TID   Continuous Infusions:  heparin 950 Units/hr (08/07/22 0414)   PRN Meds: acetaminophen **OR** acetaminophen, polyethylene glycol   Vital Signs    Vitals:   08/07/22 0412 08/07/22 0440 08/07/22 0712 08/07/22 0807  BP: (!) 163/110 (!) 169/94 (!) 157/109 (!) 154/86  Pulse: 80  97 (!) 107  Resp: 18   16  Temp:      TempSrc:      SpO2: 96%  94% 95%  Weight:  67.9 kg    Height:        Intake/Output Summary (Last 24 hours) at 08/07/2022 0935 Last data filed at 08/07/2022 0810 Gross per 24 hour  Intake 1125.66 ml  Output 650 ml  Net 475.66 ml      08/07/2022    4:40 AM 08/06/2022    8:54 AM 06/19/2022    1:15 PM  Last 3 Weights  Weight (lbs) 149 lb 9.6 oz 165 lb 158 lb  Weight (kg) 67.858 kg 74.844 kg 71.668 kg      Telemetry    Sinus rhythm - Personally Reviewed  ECG    No new tracing this morning  Physical Exam   GEN: No acute distress.   Neck: No JVD Cardiac: RRR, + systolic murmur, no rubs, or gallops.  Respiratory: Clear to auscultation bilaterally. GI: Soft, nontender, non-distended  MS: No edema; No deformity. Neuro:  Nonfocal  Psych: Normal affect   Labs    High Sensitivity Troponin:   Recent Labs  Lab 08/06/22 0900 08/06/22 1112  TROPONINIHS 106* 110*     Chemistry Recent Labs  Lab 08/06/22 0900 08/07/22 0054  NA 140 138  K 3.8 3.9  CL 110 112*  CO2 20* 16*  GLUCOSE 106* 97  BUN 36* 32*  CREATININE 2.45* 1.99*  CALCIUM 9.3 9.1  MG 2.0  --   PROT 7.4 7.3  ALBUMIN 3.2* 3.1*  AST 23 22  ALT 16 15  ALKPHOS 54 55   BILITOT 0.7 0.6  GFRNONAA 25* 32*  ANIONGAP 10 10    Lipids No results for input(s): "CHOL", "TRIG", "HDL", "LABVLDL", "LDLCALC", "CHOLHDL" in the last 168 hours.  Hematology Recent Labs  Lab 07/31/22 1153 08/06/22 0900 08/07/22 0054  WBC  --  7.8 7.6  RBC  --  3.59* 3.51*  HGB 10.7* 10.4* 10.6*  HCT  --  34.3* 33.8*  MCV  --  95.5 96.3  MCH  --  29.0 30.2  MCHC  --  30.3 31.4  RDW  --  13.6 13.9  PLT  --  244 226   Thyroid No results for input(s): "TSH", "FREET4" in the last 168 hours.  BNP Recent Labs  Lab 08/06/22 0900  BNP 1,601.5*    DDimer No results for input(s): "DDIMER" in the last 168 hours.   Radiology    ECHOCARDIOGRAM COMPLETE  Result Date: 08/06/2022    ECHOCARDIOGRAM REPORT   Patient Name:   [name] [facility] Date of Exam: 08/06/2022 Medical Rec #:  [id number]  Height:       71.0 in Accession #:    [id number]          Weight:       165.0 lb Date of Birth:  06-22-1935           BSA:          1.943 m Patient Age:    87 years            BP:           152/91 mmHg Patient Gender: M                   HR:           108 bpm. Exam Location:  Inpatient Procedure: 2D Echo, Color Doppler and Cardiac Doppler Indications:    chest pain. dyspnea  History:        Patient has prior history of Echocardiogram examinations, most                 recent 01/02/2022. PVD. chronic kidney disease.; Risk                 Factors:Dyslipidemia and Hypertension.  Sonographer:    Delcie Roch RDCS Referring Phys: [id number] Cecille Po MELVIN IMPRESSIONS  1. Left ventricular ejection fraction, by estimation, is 60 to 65%. The left ventricle has normal function. The left ventricle has no regional wall motion abnormalities. There is moderate concentric left ventricular hypertrophy. Left ventricular diastolic parameters are consistent with Grade I diastolic dysfunction (impaired relaxation).  2. Right ventricular systolic function is mildly reduced. The right ventricular size is normal. There  is moderately elevated pulmonary artery systolic pressure. The estimated right ventricular systolic pressure is 51.4 mmHg.  3. Left atrial size was mildly dilated.  4. Right atrial size was mildly dilated.  5. The mitral valve is normal in structure. No evidence of mitral valve regurgitation. No evidence of mitral stenosis.  6. Tricuspid valve regurgitation is mild to moderate.  7. Hard to see aortic leaflet morphology - possibly a bicuspid aortic valve. Doppler findings are consistent with low flow, low gradient moderate aortic stenosis. The aortic valve is abnormal. There is moderate calcification of the aortic valve. There is moderate thickening of the aortic valve. Aortic valve regurgitation is mild to moderate. Moderate aortic valve stenosis. Aortic valve mean gradient measures 14.0 mmHg. Aortic valve Vmax measures 2.61 m/s.  8. There is borderline dilatation of the ascending aorta, measuring 38 mm.  9. The inferior vena cava is normal in size with greater than 50% respiratory variability, suggesting right atrial pressure of 3 mmHg. Comparison(s): No significant change from prior study. Prior images reviewed side by side. FINDINGS  Left Ventricle: Left ventricular ejection fraction, by estimation, is 60 to 65%. The left ventricle has normal function. The left ventricle has no regional wall motion abnormalities. The left ventricular internal cavity size was normal in size. There is  moderate concentric left ventricular hypertrophy. Left ventricular diastolic parameters are consistent with Grade I diastolic dysfunction (impaired relaxation). Normal left ventricular filling pressure. Right Ventricle: The right ventricular size is normal. No increase in right ventricular wall thickness. Right ventricular systolic function is mildly reduced. There is moderately elevated pulmonary artery systolic pressure. The tricuspid regurgitant velocity is 3.48 m/s, and with an assumed right atrial pressure of 3 mmHg, the  estimated right ventricular systolic pressure is 51.4 mmHg. Left Atrium: Left atrial size was mildly dilated. Right Atrium: Right atrial size was mildly  dilated. Pericardium: There is no evidence of pericardial effusion. Mitral Valve: The mitral valve is normal in structure. No evidence of mitral valve regurgitation. No evidence of mitral valve stenosis. Tricuspid Valve: The tricuspid valve is normal in structure. Tricuspid valve regurgitation is mild to moderate. Aortic Valve: Hard to see aortic leaflet morphology - possibly a bicuspid aortic valve. Doppler findings are consistent with low flow, low gradient moderate aortic stenosis. The aortic valve is abnormal. There is moderate calcification of the aortic valve. There is moderate thickening of the aortic valve. Aortic valve regurgitation is mild to moderate. Moderate aortic stenosis is present. Aortic valve mean gradient measures 14.0 mmHg. Aortic valve peak gradient measures 27.2 mmHg. Aortic valve area,  by VTI measures 1.44 cm. Pulmonic Valve: The pulmonic valve was grossly normal. Pulmonic valve regurgitation is mild. No evidence of pulmonic stenosis. Aorta: The aortic root is normal in size and structure. There is borderline dilatation of the ascending aorta, measuring 38 mm. Venous: The inferior vena cava is normal in size with greater than 50% respiratory variability, suggesting right atrial pressure of 3 mmHg. IAS/Shunts: No atrial level shunt detected by color flow Doppler.  LEFT VENTRICLE PLAX 2D LVIDd:         3.40 cm   Diastology LVIDs:         2.30 cm   LV e' medial:    7.94 cm/s LV PW:         1.50 cm   LV E/e' medial:  6.8 LV IVS:        1.50 cm   LV e' lateral:   8.16 cm/s LVOT diam:     2.30 cm   LV E/e' lateral: 6.6 LV SV:         48 LV SV Index:   25 LVOT Area:     4.15 cm  RIGHT VENTRICLE            IVC RV Basal diam:  2.90 cm    IVC diam: 1.40 cm RV S prime:     9.46 cm/s TAPSE (M-mode): 1.1 cm LEFT ATRIUM             Index        RIGHT  ATRIUM           Index LA diam:        3.30 cm 1.70 cm/m   RA Area:     19.30 cm LA Vol (A2C):   63.6 ml 32.73 ml/m  RA Volume:   53.40 ml  27.48 ml/m LA Vol (A4C):   35.2 ml 18.12 ml/m LA Biplane Vol: 48.1 ml 24.76 ml/m  AORTIC VALVE AV Area (Vmax):    1.40 cm AV Area (Vmean):   1.31 cm AV Area (VTI):     1.44 cm AV Vmax:           261.00 cm/s AV Vmean:          173.000 cm/s AV VTI:            0.334 m AV Peak Grad:      27.2 mmHg AV Mean Grad:      14.0 mmHg LVOT Vmax:         87.90 cm/s LVOT Vmean:        54.400 cm/s LVOT VTI:          0.115 m LVOT/AV VTI ratio: 0.35  AORTA Ao Root diam: 3.70 cm Ao Asc diam:  3.80 cm MV E velocity: 54.03 cm/s  TRICUSPID VALVE                            TR Peak grad:   48.4 mmHg                            TR Vmax:        348.00 cm/s                             SHUNTS                            Systemic VTI:  0.12 m                            Systemic Diam: 2.30 cm Rachelle Hora Croitoru MD Electronically signed by Thurmon Fair MD Signature Date/Time: 08/06/2022/5:55:02 PM    Final    DG Chest Portable 1 View  Result Date: 08/06/2022 CLINICAL DATA:  No history provided EXAM: PORTABLE CHEST 1 VIEW COMPARISON:  CXR 07/20/21 FINDINGS: No pleural effusion. No pneumothorax. Borderline cardiomegaly unchanged. No radiographically apparent displaced rib fractures. Visualized upper abdomen is unremarkable. IMPRESSION: No focal airspace opacity Electronically Signed   By: Lorenza Cambridge M.D.   On: 08/06/2022 10:03    Cardiac Studies   Echo: 08/06/2022  IMPRESSIONS     1. Left ventricular ejection fraction, by estimation, is 60 to 65%. The  left ventricle has normal function. The left ventricle has no regional  wall motion abnormalities. There is moderate concentric left ventricular  hypertrophy. Left ventricular  diastolic parameters are consistent with Grade I diastolic dysfunction  (impaired relaxation).   2. Right ventricular systolic function is mildly reduced. The right   ventricular size is normal. There is moderately elevated pulmonary artery  systolic pressure. The estimated right ventricular systolic pressure is  51.4 mmHg.   3. Left atrial size was mildly dilated.   4. Right atrial size was mildly dilated.   5. The mitral valve is normal in structure. No evidence of mitral valve  regurgitation. No evidence of mitral stenosis.   6. Tricuspid valve regurgitation is mild to moderate.   7. Hard to see aortic leaflet morphology - possibly a bicuspid aortic  valve. Doppler findings are consistent with low flow, low gradient  moderate aortic stenosis. The aortic valve is abnormal. There is moderate  calcification of the aortic valve. There  is moderate thickening of the aortic valve. Aortic valve regurgitation is  mild to moderate. Moderate aortic valve stenosis. Aortic valve mean  gradient measures 14.0 mmHg. Aortic valve Vmax measures 2.61 m/s.   8. There is borderline dilatation of the ascending aorta, measuring 38  mm.   9. The inferior vena cava is normal in size with greater than 50%  respiratory variability, suggesting right atrial pressure of 3 mmHg.   Comparison(s): No significant change from prior study. Prior images  reviewed side by side.   FINDINGS   Left Ventricle: Left ventricular ejection fraction, by estimation, is 60  to 65%. The left ventricle has normal function. The left ventricle has no  regional wall motion abnormalities. The left ventricular internal cavity  size was normal in size. There is   moderate concentric left ventricular hypertrophy. Left ventricular  diastolic parameters are consistent with Grade I  diastolic dysfunction  (impaired relaxation). Normal left ventricular filling pressure.   Right Ventricle: The right ventricular size is normal. No increase in  right ventricular wall thickness. Right ventricular systolic function is  mildly reduced. There is moderately elevated pulmonary artery systolic  pressure. The  tricuspid regurgitant  velocity is 3.48 m/s, and with an assumed right atrial pressure of 3 mmHg,  the estimated right ventricular systolic pressure is 51.4 mmHg.   Left Atrium: Left atrial size was mildly dilated.   Right Atrium: Right atrial size was mildly dilated.   Pericardium: There is no evidence of pericardial effusion.   Mitral Valve: The mitral valve is normal in structure. No evidence of  mitral valve regurgitation. No evidence of mitral valve stenosis.   Tricuspid Valve: The tricuspid valve is normal in structure. Tricuspid  valve regurgitation is mild to moderate.   Aortic Valve: Hard to see aortic leaflet morphology - possibly a bicuspid  aortic valve. Doppler findings are consistent with low flow, low gradient  moderate aortic stenosis. The aortic valve is abnormal. There is moderate  calcification of the aortic  valve. There is moderate thickening of the aortic valve. Aortic valve  regurgitation is mild to moderate. Moderate aortic stenosis is present.  Aortic valve mean gradient measures 14.0 mmHg. Aortic valve peak gradient  measures 27.2 mmHg. Aortic valve area,   by VTI measures 1.44 cm.   Pulmonic Valve: The pulmonic valve was grossly normal. Pulmonic valve  regurgitation is mild. No evidence of pulmonic stenosis.   Aorta: The aortic root is normal in size and structure. There is  borderline dilatation of the ascending aorta, measuring 38 mm.   Venous: The inferior vena cava is normal in size with greater than 50%  respiratory variability, suggesting right atrial pressure of 3 mmHg.   IAS/Shunts: No atrial level shunt detected by color flow Doppler.   Patient Profile     87 y.o. male with a hx of HTN, anemia, CKD, prostate CA  who is being seen 08/06/2022 for the evaluation of chest pain at the request of Dr. Jeraldine Loots.   Assessment & Plan    Chest tightness Dyspnea NSTEMI vs demand ischemia -- presented with 2 separate episodes of chest tightness and  shortness of breath. EKG without acute ST/T wave changes, hsTn 106>>110. BNP is elevated 1601,but CXR without overt edema and no signs of CHF on exam -- he will need ischemic evaluation, Cr has improved this morning. Will plan for [facility] today -- continue IV heparin, ASA, coreg and continue statin    HTN -- blood pressures not well controlled -- has been on norvasc and hydralazine PTA -- increase coreg to 6.25mg  BID    CKD IIIb w/AKI -- last Cr 06/2021 was 1.88, now 2.45 on admission, now improved to 1.99 -- appears he was on a ACE prior to admission, would continue to hold -- follow BMET   Cognitive impairment -- on aricept   DM -- on jardiance PTA   Mild-moderate aortic stenosis -- echo with doppler findings are consistent with low flow, low gradient  moderate aortic stenosis -- Moderate aortic valve stenosis. Aortic valve mean  gradient measures 14.0 mmHg. Aortic valve Vmax measures 2.61 m/s.  -- plan for [facility] today  Informed Consent   Shared Decision Making/Informed Consent{  The risks [stroke (1 in 1000), death (1 in 1000), kidney failure [usually temporary] (1 in 500), bleeding (1 in 200), allergic reaction [possibly serious] (1 in 200)], benefits (diagnostic support and management  of coronary artery disease) and alternatives of a cardiac catheterization were discussed in detail with Mr. Slaven and he is willing to proceed.    For questions or updates, please contact [facility] Please consult [email or web address] for contact info under        Signed, Laverda Page, NP  08/07/2022, 9:35 AM    Agree with note by Laverda Page NP-C  Patient mated with unstable angina.  His troponins were elevated but flat.  His serum creatinine has dropped down to baseline although continues to be moderately elevated.  2D echo revealed normal LV function with moderate aortic stenosis.  He said no recurrent symptoms.  He is scheduled for right/ left heart cath later  today.  Runell Gess, M.D., FACP, [facility], [facility] [address] [place], [place]  [address]  [phone] 08/07/2022 10:21 AM

## 2022-08-07 NOTE — Progress Notes (Signed)
ANTICOAGULATION CONSULT NOTE - Follow Up  Pharmacy Consult for Heparin Indication: chest pain/ACS  No Known Allergies  Patient Measurements: Height: 5\' 11"  (180.3 cm) Weight: 74.8 kg (165 lb) IBW/kg (Calculated) : 75.3 Heparin Dosing Weight: 74.8 kg  Vital Signs: Temp: 97.4 F (36.3 C) (07/09 2004) Temp Source: Oral (07/09 2004) BP: 163/106 (07/09 2231) Pulse Rate: 92 (07/09 2231)  Labs: Recent Labs    08/06/22 0900 08/06/22 1112 08/07/22 0054  HGB 10.4*  --  10.6*  HCT 34.3*  --  33.8*  PLT 244  --  226  HEPARINUNFRC  --   --  0.30  CREATININE 2.45*  --  1.99*  TROPONINIHS 106* 110*  --      Estimated Creatinine Clearance: 28.7 mL/min (A) (by C-G formula based on SCr of 1.99 mg/dL (H)).   Medical History: Past Medical History:  Diagnosis Date   Anemia    CKD (chronic kidney disease)    Heart murmur    Hypertension    Hypertensive urgency    Peripheral polyneuropathy    Prostate cancer (HCC)    PVD (peripheral vascular disease) (HCC)     Assessment: 87 YOM w/ PMH sig for HTN, anemia, CKD, prostate cancer who presented w/ chest pain. No evidence of PTA anticoagulation. Hgb 10.4, plts 244. No signs/symptoms of bleeding.   Heparin level returned at 0.3-therapeutic. Per RN no signs/symptoms of bleeding or issues with the heparin infusion.  Goal of Therapy:  Heparin level 0.3-0.7 units/ml Monitor platelets by anticoagulation protocol: Yes   Plan:  Increase heparin infusion at 950 units/hr Continue to monitor H&H and platelets Check heparin level in 8-hours and daily   Arabella Merles, PharmD. Clinical Pharmacist 08/07/2022 2:07 AM

## 2022-08-07 NOTE — Plan of Care (Signed)
  Problem: Education: Goal: Ability to demonstrate management of disease process will improve Outcome: Progressing Goal: Ability to verbalize understanding of medication therapies will improve Outcome: Progressing Goal: Individualized Educational Video(s) Outcome: Progressing   Problem: Activity: Goal: Capacity to carry out activities will improve Outcome: Progressing   Problem: Cardiac: Goal: Ability to achieve and maintain adequate cardiopulmonary perfusion will improve Outcome: Progressing   Problem: Education: Goal: Knowledge of General Education information will improve Description: Including pain rating scale, medication(s)/side effects and non-pharmacologic comfort measures Outcome: Progressing   Problem: Health Behavior/Discharge Planning: Goal: Ability to manage health-related needs will improve Outcome: Progressing   Problem: Clinical Measurements: Goal: Ability to maintain clinical measurements within normal limits will improve Outcome: Progressing Goal: Will remain free from infection Outcome: Progressing Goal: Diagnostic test results will improve Outcome: Progressing Goal: Respiratory complications will improve Outcome: Progressing Goal: Cardiovascular complication will be avoided Outcome: Progressing   Problem: Activity: Goal: Risk for activity intolerance will decrease Outcome: Progressing   Problem: Nutrition: Goal: Adequate nutrition will be maintained Outcome: Progressing   Problem: Coping: Goal: Level of anxiety will decrease Outcome: Progressing   Problem: Elimination: Goal: Will not experience complications related to bowel motility Outcome: Progressing Goal: Will not experience complications related to urinary retention Outcome: Progressing   Problem: Pain Managment: Goal: General experience of comfort will improve Outcome: Progressing   Problem: Safety: Goal: Ability to remain free from injury will improve Outcome: Progressing    Problem: Skin Integrity: Goal: Risk for impaired skin integrity will decrease Outcome: Progressing   Problem: Education: Goal: Understanding of CV disease, CV risk reduction, and recovery process will improve Outcome: Progressing Goal: Individualized Educational Video(s) Outcome: Progressing   Problem: Activity: Goal: Ability to return to baseline activity level will improve Outcome: Progressing   Problem: Cardiovascular: Goal: Ability to achieve and maintain adequate cardiovascular perfusion will improve Outcome: Progressing Goal: Vascular access site(s) Level 0-1 will be maintained Outcome: Progressing   Problem: Health Behavior/Discharge Planning: Goal: Ability to safely manage health-related needs after discharge will improve Outcome: Progressing   

## 2022-08-08 ENCOUNTER — Encounter (HOSPITAL_COMMUNITY): Payer: Self-pay | Admitting: Cardiology

## 2022-08-08 DIAGNOSIS — R079 Chest pain, unspecified: Secondary | ICD-10-CM | POA: Diagnosis not present

## 2022-08-08 LAB — BASIC METABOLIC PANEL
Anion gap: 10 (ref 5–15)
BUN: 32 mg/dL — ABNORMAL HIGH (ref 8–23)
CO2: 17 mmol/L — ABNORMAL LOW (ref 22–32)
Calcium: 9 mg/dL (ref 8.9–10.3)
Chloride: 113 mmol/L — ABNORMAL HIGH (ref 98–111)
Creatinine, Ser: 2.09 mg/dL — ABNORMAL HIGH (ref 0.61–1.24)
GFR, Estimated: 30 mL/min — ABNORMAL LOW (ref >60)
Glucose, Bld: 97 mg/dL (ref 70–99)
Potassium: 4.1 mmol/L (ref 3.5–5.1)
Sodium: 140 mmol/L (ref 135–145)

## 2022-08-08 MED ORDER — HYDRALAZINE HCL 50 MG PO TABS
75.0000 mg | ORAL_TABLET | Freq: Three times a day (TID) | ORAL | Status: DC
  Administered 2022-08-08: 75 mg via ORAL
  Filled 2022-08-08: qty 1

## 2022-08-08 MED ORDER — CARVEDILOL 6.25 MG PO TABS: 6.2500 mg | ORAL_TABLET | Freq: Two times a day (BID) | ORAL | 1 refills | Status: DC

## 2022-08-08 MED ORDER — ATORVASTATIN CALCIUM 10 MG PO TABS: 10.0000 mg | ORAL_TABLET | Freq: Every day | ORAL | 1 refills | Status: DC

## 2022-08-08 MED ORDER — HYDRALAZINE HCL 25 MG PO TABS: 75.0000 mg | ORAL_TABLET | Freq: Three times a day (TID) | ORAL | 1 refills | Status: DC

## 2022-08-08 MED ORDER — ATORVASTATIN CALCIUM 10 MG PO TABS
10.0000 mg | ORAL_TABLET | Freq: Every day | ORAL | Status: DC
  Administered 2022-08-08: 10 mg via ORAL
  Filled 2022-08-08 (×2): qty 1

## 2022-08-08 NOTE — TOC CM/SW Note (Signed)
Transition of Care [facility]) - Inpatient Brief Assessment   Patient Details  Name: Derek Blackburn MRN: [id number] Date of Birth: October 21, 1935  Transition of Care [facility] CM/SW Contact:    Gala Lewandowsky, RN Phone Number: 08/08/2022, 11:51 AM   Clinical Narrative: Patient presented for chest pain. PTA patient was from home with spouse and support of daughter. Patient is currently going to the [facility] via his insurance silver sneakers and he feels that he will not need additional outpatient PT Services arranged. [facility] No further home needs identified.    Transition of Care Asessment: Insurance and Status: Insurance coverage has been reviewed Patient has primary care physician: Yes Home environment has been reviewed: reviewed Prior level of function:: independent Prior/Current Home Services: No current home services Social Determinants of Health Reivew: SDOH reviewed no interventions necessary Readmission risk has been reviewed: Yes Transition of care needs: no transition of care needs at this time

## 2022-08-08 NOTE — Evaluation (Signed)
Physical Therapy Evaluation Patient Details Name: Derek Blackburn MRN: [id number] DOB: May 03, 1935 Today's Date: 08/08/2022  History of Present Illness  87 yo male presents to [facility] on 7/9 with chest pain. s/p RHC 7/10, chest pain and dyspnea thought to be due to HTN. PMH includes HTN, HLD, CKDIII, PVD, prostate cancer, memory impairment, anemia.  Clinical Impression   Pt presents with generalized weakness, impaired balance, history of LBP, dyspnea on exertion with accompanying desat to 89% in RA, and decreased activity tolerance. Pt to benefit from acute PT to address deficits. Pt ambulated good hallway distance, requiring x2 seated and standing rest breaks to recover dyspnea. BP 150/100 post-gait, and pt tachycardic up to 120s with gait. PT to progress mobility as tolerated, and will continue to follow acutely.          Assistance Recommended at Discharge PRN  If plan is discharge home, recommend the following:  Can travel by private vehicle  A little help with walking and/or transfers;A little help with bathing/dressing/bathroom        Equipment Recommendations None recommended by PT  Recommendations for Other Services       Functional Status Assessment Patient has had a recent decline in their functional status and demonstrates the ability to make significant improvements in function in a reasonable and predictable amount of time.     Precautions / Restrictions Precautions Precautions: Fall Precaution Comments: DOE, watch SPO2 Restrictions Weight Bearing Restrictions: No      Mobility  Bed Mobility Overal bed mobility: Modified Independent             General bed mobility comments: up in bathroom    Transfers Overall transfer level: Needs assistance Equipment used: None Transfers: Sit to/from Stand Sit to Stand: Supervision           General transfer comment: safety, slow to rise    Ambulation/Gait Ambulation/Gait assistance: Supervision Gait  Distance (Feet): 100 Feet (x3 - combination of seated and standing rest breaks) Assistive device: None Gait Pattern/deviations: Step-through pattern, Decreased stride length, Trunk flexed Gait velocity: decr     General Gait Details: for safety, slowed gait with DOE 2/4 SPO2 drop to 89% on RA. requiring standing and seated rest breaks to recover dyspnea  Stairs            Wheelchair Mobility     Tilt Bed    Modified Rankin (Stroke Patients Only)       Balance Overall balance assessment: Needs assistance Sitting-balance support: No upper extremity supported, Feet supported Sitting balance-Leahy Scale: Good     Standing balance support: No upper extremity supported, During functional activity Standing balance-Leahy Scale: Fair                               Pertinent Vitals/Pain Pain Assessment Pain Assessment: No/denies pain    Home Living Family/patient expects to be discharged to:: Private residence Living Arrangements: Spouse/significant other Available Help at Discharge: Family Type of Home: House Home Access: Stairs to enter Entrance Stairs-Rails: Left Entrance Stairs-Number of Steps: a couple   Home Layout: One level Home Equipment: Cane - single point      Prior Function Prior Level of Function : Independent/Modified Independent;Driving             M[facility]ility [facility], uses a cane intermitently. no falls       Hand Dominance   Dominant  Hand: Right    Extremity/Trunk Assessment   Upper Extremity Assessment Upper Extremity Assessment: Defer to OT evaluation    Lower Extremity Assessment Lower Extremity Assessment: Generalized weakness    Cervical / Trunk Assessment Cervical / Trunk Assessment: Normal  Communication   Communication: No difficulties  Cognition Arousal/Alertness: Awake/alert Behavior During Therapy: WFL for tasks assessed/performed Overall Cognitive Status: History of  cognitive impairments - at baseline                                 General Comments: history of "memory impairment", has some periods of questionable history-giving. very pleasant        General Comments      Exercises     Assessment/Plan    PT Assessment Patient needs continued PT services  PT Problem List Decreased strength;Decreased activity tolerance;Decreased mobility;Decreased safety awareness;Cardiopulmonary status limiting activity;Decreased knowledge of use of DME;Decreased balance       PT Treatment Interventions DME instruction;Gait training;Functional mobility training;Stair training;Therapeutic activities;Therapeutic exercise;Balance training;Patient/family education    PT Goals (Current goals can be found in the Care Plan section)  Acute Rehab PT Goals Patient Stated Goal: breathe better, home PT Goal Formulation: With patient Time For Goal Achievement: 08/22/22 Potential to Achieve Goals: Good    Frequency Min 1X/week     Co-evaluation               AM-PAC PT "6 Clicks" Mobility  Outcome Measure Help needed turning from your back to your side while in a flat bed without using bedrails?: None Help needed moving from lying on your back to sitting on the side of a flat bed without using bedrails?: None Help needed moving to and from a bed to a chair (including a wheelchair)?: A Little Help needed standing up from a chair using your arms (e.g., wheelchair or bedside chair)?: A Little Help needed to walk in hospital room?: A Little Help needed climbing 3-5 steps with a railing? : A Little 6 Click Score: 20    End of Session   Activity Tolerance: Patient tolerated treatment well;Patient limited by fatigue Patient left: in chair;with call bell/phone within reach;with chair alarm set Nurse Communication: Mobility status PT Visit Diagnosis: Other abnormalities of gait and mobility (R26.89)    Time: 1610-9604 PT Time Calculation (min)  (ACUTE ONLY): 26 min   Charges:   PT Evaluation $PT Eval Low Complexity: 1 Low PT Treatments $Therapeutic Activity: 8-22 mins PT General Charges $$ ACUTE PT VISIT: 1 Visit         Marye Round, PT DPT Acute Rehabilitation Services Secure Chat Preferred  Office [phone]   Bentlie Withem E Christain [place] 08/08/2022, 10:34 AM

## 2022-08-08 NOTE — Discharge Summary (Signed)
Physician Discharge Summary  Derek Blackburn ZOX:[id number] DOB: 10-01-1935 DOA: 08/06/2022  PCP: Diamantina Providence, FNP  Admit date: 08/06/2022 Discharge date: 08/08/2022  Admitted From: Home Disposition:  Home  Discharge Condition:Stable CODE STATUS:FULL Diet recommendation: Heart Healthy  Brief/Interim Summary: Patient is a 87 year old male with history of hypertension, hyperlipidemia, CKD stage IIIb, peripheral vascular disease, prostate cancer, memory impairment who presented with chest pain, shortness of breath.  On presentation he was mildly hypertensive.  Labs showed creatinine of 2.45, mild elevated troponin, elevated BNP.  Cardiology consulted. S/P  ischemic workup with right/left heart cath which showed minimal nonobstructive coronary disease.  He is chest pain free currently, hemodynamically stable, mild hypertensive this morning.  Cardiology cleared for discharge.  Following problems were addressed during the hospitalization:  Chest pain/rule out ACS: Presented with shortness of breath, chest pain.  Currently chest pain-free.  Elevated troponin with flat trend.  Elevated BNP.  EKG without acute ST/T wave changes.  Chest x-ray showed borderline cardiomegaly.  Echo showed EF of 65%, no wall motion abnormality, grade 1 diastolic dysfunction. S/P  ischemic workup with right/left heart cath which showed minimal nonobstructive coronary artery disease.   AKI in CKD stage IIIb: Baseline creatinine around 1.8.  Currently kidney function close to baseline.   Hypertension: On amlodipine, hydralazine, Coreg, tandrolapril.  Monitor blood pressure at home   Hyperlipidemia/peripheral vascular disease: Continue home medications   Memory impairment: Continue home donepezil   Normocytic anemia: Currently hemoglobin stable.   Diabetes type 2: On Jardiance   Mild to moderate aortic stenosis: As per echo.  He will follow-up with cardiology as an outpatient   Debility/generalized  weakness/back pain: PT saw the patient, no follow-up recommended    Discharge Diagnoses:  Principal Problem:   Chest pain, rule out acute myocardial infarction Active Problems:   Malignant neoplasm of prostate (HCC)   Memory impairment   Anemia   Essential hypertension   Hyperlipidemia   Peripheral vascular disease (HCC)   Elevated brain natriuretic peptide (BNP) level   Acute renal failure superimposed on stage 3b chronic kidney disease [facility])    Discharge Instructions  Discharge Instructions     Diet - low sodium heart healthy   Complete by: As directed    Discharge instructions   Complete by: As directed    1)Please take prescribed medications as instructed 2)Monitor your blood pressure at home 3)Please follow-up with your PCP in a week 4)Follow up with cardiology , you will be called for follow-up appointment   Increase activity slowly   Complete by: As directed       Allergies as of 08/08/2022   No Known Allergies      Medication List     TAKE these medications    amLODipine 10 MG tablet Commonly known as: NORVASC Take 1 tablet (10 mg total) by mouth daily.   atorvastatin 10 MG tablet Commonly known as: LIPITOR Take 1 tablet (10 mg total) by mouth daily. Start taking on: August 09, 2022   carvedilol 6.25 MG tablet Commonly known as: COREG Take 1 tablet (6.25 mg total) by mouth 2 (two) times daily with a meal.   donepezil 10 MG tablet Commonly known as: ARICEPT Take 1 tablet (10 mg total) by mouth daily.   hydrALAZINE 25 MG tablet Commonly known as: APRESOLINE Take 3 tablets (75 mg total) by mouth 3 (three) times daily. What changed:  medication strength how much to take   Jardiance 10 MG Tabs tablet Generic drug: empagliflozin  Take 10 mg by mouth daily.   latanoprost 0.005 % ophthalmic solution Commonly known as: XALATAN Place 1 drop into both eyes at bedtime.   leflunomide 20 MG tablet Commonly known as: ARAVA Take 20 mg by mouth  daily.   multivitamin with minerals Tabs tablet Take 1 tablet by mouth daily.   timolol 0.5 % ophthalmic solution Commonly known as: TIMOPTIC 1 drop 2 (two) times daily.   trandolapril 2 MG tablet Commonly known as: MAVIK Take 2 mg by mouth daily.   vitamin B-12 100 MCG tablet Commonly known as: CYANOCOBALAMIN Take 100 mcg by mouth daily.   VITAMIN D (CHOLECALCIFEROL) PO Take 1 tablet by mouth daily.        Follow-up Information     Diamantina Providence, FNP. Schedule an appointment as soon as possible for a visit in 1 week(s).   Specialty: Nurse Practitioner Contact information: [address] [place] [address] [phone]                No Known Allergies  Consultations: Cardiology   Procedures/Studies: CARDIAC CATHETERIZATION  Result Date: 08/07/2022   LV end diastolic pressure is normal.   Hemodynamic findings consistent with mild pulmonary hypertension.   There is mild aortic valve stenosis. Minimal nonobstructive CAD Mild aortic stenosis. Mean AV gradient 11 mm Hg. AVA 2.05 cm squared with index 1.1 Normal LV filling pressures. LVEDP 7 mm Hg. PCWP 8/7 with mean 6 mm Hg Mild pulmonary HTN. PAP 60/14 with mean 32 mm Hg Normal RA pressure mean 9 mm Hg Normal cardiac output 5.73 L/min, index 3.08. Plan: medical management.   ECHOCARDIOGRAM COMPLETE  Result Date: 08/06/2022    ECHOCARDIOGRAM REPORT   Patient Name:   Derek Blackburn [facility] Date of Exam: 08/06/2022 Medical Rec #:  [id number]           Height:       71.0 in Accession #:    [id number]          Weight:       165.0 lb Date of Birth:  1935/10/06           BSA:          1.943 m Patient Age:    87 years            BP:           152/91 mmHg Patient Gender: M                   HR:           108 bpm. Exam Location:  Inpatient Procedure: 2D Echo, Color Doppler and Cardiac Doppler Indications:    chest pain. dyspnea  History:        Patient has prior history of Echocardiogram examinations, most                  recent 01/02/2022. PVD. chronic kidney disease.; Risk                 Factors:Dyslipidemia and Hypertension.  Sonographer:    Delcie Roch RDCS Referring Phys: [id number] Cecille Po MELVIN IMPRESSIONS  1. Left ventricular ejection fraction, by estimation, is 60 to 65%. The left ventricle has normal function. The left ventricle has no regional wall motion abnormalities. There is moderate concentric left ventricular hypertrophy. Left ventricular diastolic parameters are consistent with Grade I diastolic dysfunction (impaired relaxation).  2. Right ventricular systolic function is mildly reduced. The right ventricular size  is normal. There is moderately elevated pulmonary artery systolic pressure. The estimated right ventricular systolic pressure is 51.4 mmHg.  3. Left atrial size was mildly dilated.  4. Right atrial size was mildly dilated.  5. The mitral valve is normal in structure. No evidence of mitral valve regurgitation. No evidence of mitral stenosis.  6. Tricuspid valve regurgitation is mild to moderate.  7. Hard to see aortic leaflet morphology - possibly a bicuspid aortic valve. Doppler findings are consistent with low flow, low gradient moderate aortic stenosis. The aortic valve is abnormal. There is moderate calcification of the aortic valve. There is moderate thickening of the aortic valve. Aortic valve regurgitation is mild to moderate. Moderate aortic valve stenosis. Aortic valve mean gradient measures 14.0 mmHg. Aortic valve Vmax measures 2.61 m/s.  8. There is borderline dilatation of the ascending aorta, measuring 38 mm.  9. The inferior vena cava is normal in size with greater than 50% respiratory variability, suggesting right atrial pressure of 3 mmHg. Comparison(s): No significant change from prior study. Prior images reviewed side by side. FINDINGS  Left Ventricle: Left ventricular ejection fraction, by estimation, is 60 to 65%. The left ventricle has normal function. The left  ventricle has no regional wall motion abnormalities. The left ventricular internal cavity size was normal in size. There is  moderate concentric left ventricular hypertrophy. Left ventricular diastolic parameters are consistent with Grade I diastolic dysfunction (impaired relaxation). Normal left ventricular filling pressure. Right Ventricle: The right ventricular size is normal. No increase in right ventricular wall thickness. Right ventricular systolic function is mildly reduced. There is moderately elevated pulmonary artery systolic pressure. The tricuspid regurgitant velocity is 3.48 m/s, and with an assumed right atrial pressure of 3 mmHg, the estimated right ventricular systolic pressure is 51.4 mmHg. Left Atrium: Left atrial size was mildly dilated. Right Atrium: Right atrial size was mildly dilated. Pericardium: There is no evidence of pericardial effusion. Mitral Valve: The mitral valve is normal in structure. No evidence of mitral valve regurgitation. No evidence of mitral valve stenosis. Tricuspid Valve: The tricuspid valve is normal in structure. Tricuspid valve regurgitation is mild to moderate. Aortic Valve: Hard to see aortic leaflet morphology - possibly a bicuspid aortic valve. Doppler findings are consistent with low flow, low gradient moderate aortic stenosis. The aortic valve is abnormal. There is moderate calcification of the aortic valve. There is moderate thickening of the aortic valve. Aortic valve regurgitation is mild to moderate. Moderate aortic stenosis is present. Aortic valve mean gradient measures 14.0 mmHg. Aortic valve peak gradient measures 27.2 mmHg. Aortic valve area,  by VTI measures 1.44 cm. Pulmonic Valve: The pulmonic valve was grossly normal. Pulmonic valve regurgitation is mild. No evidence of pulmonic stenosis. Aorta: The aortic root is normal in size and structure. There is borderline dilatation of the ascending aorta, measuring 38 mm. Venous: The inferior vena cava is  normal in size with greater than 50% respiratory variability, suggesting right atrial pressure of 3 mmHg. IAS/Shunts: No atrial level shunt detected by color flow Doppler.  LEFT VENTRICLE PLAX 2D LVIDd:         3.40 cm   Diastology LVIDs:         2.30 cm   LV e' medial:    7.94 cm/s LV PW:         1.50 cm   LV E/e' medial:  6.8 LV IVS:        1.50 cm   LV e' lateral:   8.16 cm/s  LVOT diam:     2.30 cm   LV E/e' lateral: 6.6 LV SV:         48 LV SV Index:   25 LVOT Area:     4.15 cm  RIGHT VENTRICLE            IVC RV Basal diam:  2.90 cm    IVC diam: 1.40 cm RV S prime:     9.46 cm/s TAPSE (M-mode): 1.1 cm LEFT ATRIUM             Index        RIGHT ATRIUM           Index LA diam:        3.30 cm 1.70 cm/m   RA Area:     19.30 cm LA Vol (A2C):   63.6 ml 32.73 ml/m  RA Volume:   53.40 ml  27.48 ml/m LA Vol (A4C):   35.2 ml 18.12 ml/m LA Biplane Vol: 48.1 ml 24.76 ml/m  AORTIC VALVE AV Area (Vmax):    1.40 cm AV Area (Vmean):   1.31 cm AV Area (VTI):     1.44 cm AV Vmax:           261.00 cm/s AV Vmean:          173.000 cm/s AV VTI:            0.334 m AV Peak Grad:      27.2 mmHg AV Mean Grad:      14.0 mmHg LVOT Vmax:         87.90 cm/s LVOT Vmean:        54.400 cm/s LVOT VTI:          0.115 m LVOT/AV VTI ratio: 0.35  AORTA Ao Root diam: 3.70 cm Ao Asc diam:  3.80 cm MV E velocity: 54.03 cm/s  TRICUSPID VALVE                            TR Peak grad:   48.4 mmHg                            TR Vmax:        348.00 cm/s                             SHUNTS                            Systemic VTI:  0.12 m                            Systemic Diam: 2.30 cm Rachelle Hora Croitoru MD Electronically signed by Thurmon Fair MD Signature Date/Time: 08/06/2022/5:55:02 PM    Final    DG Chest Portable 1 View  Result Date: 08/06/2022 CLINICAL DATA:  No history provided EXAM: PORTABLE CHEST 1 VIEW COMPARISON:  CXR 07/20/21 FINDINGS: No pleural effusion. No pneumothorax. Borderline cardiomegaly unchanged. No radiographically apparent  displaced rib fractures. Visualized upper abdomen is unremarkable. IMPRESSION: No focal airspace opacity Electronically Signed   By: Lorenza Cambridge M.D.   On: 08/06/2022 10:03      Subjective: Patient seen and examined at bedside today.  Hemodynamically stable, comfortable.  Walked with physical therapy this morning.  Remains chest pain-free.  Complains of some shortness of breath after walking, his lungs are clear, saturating fine on room air.  I called the wife  and discussed about discharge planning  Discharge Exam: Vitals:   08/08/22 0405 08/08/22 0721  BP: 132/84 (!) 156/103  Pulse: 90 83  Resp: 18 14  Temp: 97.9 F (36.6 C) 97.9 F (36.6 C)  SpO2: 95% 95%   Vitals:   08/07/22 1920 08/07/22 2003 08/08/22 0405 08/08/22 0721  BP: (!) 150/97 (!) 155/104 132/84 (!) 156/103  Pulse: 88  90 83  Resp: 20 (!) 25 18 14   Temp: 97.9 F (36.6 C) 97.9 F (36.6 C) 97.9 F (36.6 C) 97.9 F (36.6 C)  TempSrc: Oral Oral Oral Oral  SpO2: 95% 97% 95% 95%  Weight:   67.7 kg   Height:        General: Pt is alert, awake, not in acute distress Cardiovascular: RRR, S1/S2 +, no rubs, no gallops Respiratory: CTA bilaterally, no wheezing, no rhonchi Abdominal: Soft, NT, ND, bowel sounds + Extremities: no edema, no cyanosis    The results of significant diagnostics from this hospitalization (including imaging, microbiology, ancillary and laboratory) are listed below for reference.     Microbiology: No results found for this or any previous visit (from the past 240 hour(s)).   Labs: BNP (last 3 results) Recent Labs    08/06/22 0900  BNP 1,601.5*   Basic Metabolic Panel: Recent Labs  Lab 08/06/22 0900 08/07/22 0054 08/07/22 1432 08/07/22 1436 08/07/22 1437 08/08/22 0142  NA 140 138 142 142 141 140  K 3.8 3.9 4.0 4.1 4.0 4.1  CL 110 112*  --   --   --  113*  CO2 20* 16*  --   --   --  17*  GLUCOSE 106* 97  --   --   --  97  BUN 36* 32*  --   --   --  32*  CREATININE 2.45*  1.99*  --   --   --  2.09*  CALCIUM 9.3 9.1  --   --   --  9.0  MG 2.0  --   --   --   --   --    Liver Function Tests: Recent Labs  Lab 08/06/22 0900 08/07/22 0054  AST 23 22  ALT 16 15  ALKPHOS 54 55  BILITOT 0.7 0.6  PROT 7.4 7.3  ALBUMIN 3.2* 3.1*   Recent Labs  Lab 08/06/22 0900  LIPASE 49   No results for input(s): "AMMONIA" in the last 168 hours. CBC: Recent Labs  Lab 08/06/22 0900 08/07/22 0054 08/07/22 1432 08/07/22 1436 08/07/22 1437  WBC 7.8 7.6  --   --   --   NEUTROABS 5.7  --   --   --   --   HGB 10.4* 10.6* 10.2* 10.9* 10.5*  HCT 34.3* 33.8* 30.0* 32.0* 31.0*  MCV 95.5 96.3  --   --   --   PLT 244 226  --   --   --    Cardiac Enzymes: No results for input(s): "CKTOTAL", "CKMB", "CKMBINDEX", "TROPONINI" in the last 168 hours. BNP: Invalid input(s): "POCBNP" CBG: Recent Labs  Lab 08/06/22 0929  GLUCAP 103*   D-Dimer No results for input(s): "DDIMER" in the last 72 hours. Hgb A1c No results for input(s): "HGBA1C" in the last 72 hours. Lipid Profile No results for input(s): "CHOL", "HDL", "LDLCALC", "TRIG", "CHOLHDL", "LDLDIRECT" in the last 72 hours. Thyroid function studies No  results for input(s): "TSH", "T4TOTAL", "T3FREE", "THYROIDAB" in the last 72 hours.  Invalid input(s): "FREET3" Anemia work up No results for input(s): "VITAMINB12", "FOLATE", "FERRITIN", "TIBC", "IRON", "RETICCTPCT" in the last 72 hours. Urinalysis    Component Value Date/Time   COLORURINE ORANGE (A) 01/21/2011 1720   APPEARANCEUR CLOUDY (A) 01/21/2011 1720   LABSPEC 1.025 01/21/2011 1720   PHURINE 5.0 01/21/2011 1720   GLUCOSEU NEGATIVE 01/21/2011 1720   HGBUR NEGATIVE 01/21/2011 1720   BILIRUBINUR SMALL (A) 01/21/2011 1720   KETONESUR 15 (A) 01/21/2011 1720   PROTEINUR 100 (A) 01/21/2011 1720   UROBILINOGEN 1.0 01/21/2011 1720   NITRITE NEGATIVE 01/21/2011 1720   LEUKOCYTESUR SMALL (A) 01/21/2011 1720   Sepsis Labs Recent Labs  Lab 08/06/22 0900  08/07/22 0054  WBC 7.8 7.6   Microbiology No results found for this or any previous visit (from the past 240 hour(s)).  Please note: You were cared for by a hospitalist during your hospital stay. Once you are discharged, your primary care physician will handle any further medical issues. Please note that NO REFILLS for any discharge medications will be authorized once you are discharged, as it is imperative that you return to your primary care physician (or establish a relationship with a primary care physician if you do not have one) for your post hospital discharge needs so that they can reassess your need for medications and monitor your lab values.    Time coordinating discharge: 40 minutes  SIGNED:   Burnadette Pop, MD  [facility] 08/08/2022, 11:38 AM Pager [phone]  If 7PM-7AM, please contact night-coverage [email or web address] Password [email or web address]

## 2022-08-08 NOTE — Evaluation (Signed)
Occupational Therapy Evaluation and Discharge Patient Details Name: Derek Blackburn MRN: [id number] DOB: 01/02/1936 Today's Date: 08/08/2022   History of Present Illness 87 yo male presents to [facility] on 7/9 with chest pain. s/p RHC 7/10, chest pain and dyspnea thought to be due to HTN. PMH includes HTN, HLD, CKDIII, PVD, prostate cancer, memory impairment, anemia.   Clinical Impression   Pt walks with a cane at times and is independent in self care. His wife assists with keeping his calendar/appointments and med management. Pt reports playing tennis until 2 years ago, [facility]. Pt reports dyspnea with ambulation, but does not experience when standing in the shower or dressing. Pt has a built in seat in the shower should he need to sit. Educated in energy conservation and breathing techniques with pt verbalizing understanding. SpO2 92% during short distance walking in room/bathroom and with pt demonstrating ADLs skills on RA. No further OT needs.     Recommendations for follow up therapy are one component of a multi-disciplinary discharge planning process, led by the attending physician.  Recommendations may be updated based on patient status, additional functional criteria and insurance authorization.   Assistance Recommended at Discharge Set up Supervision/Assistance  Patient can return home with the following Direct supervision/assist for medications management;Direct supervision/assist for financial management;Assist for transportation;Assistance with cooking/housework    Functional Status Assessment  Patient has had a recent decline in their functional status and demonstrates the ability to make significant improvements in function in a reasonable and predictable amount of time.  Equipment Recommendations  None recommended by OT    Recommendations for Other Services       Precautions / Restrictions  Precautions Precautions: Fall Precaution Comments: DOE, watch SPO2 Restrictions Weight Bearing Restrictions: No      Mobility Bed Mobility               General bed mobility comments: received in chair    Transfers Overall transfer level: Needs assistance Equipment used: None Transfers: Sit to/from Stand Sit to Stand: Supervision           General transfer comment: safety, slow to rise      Balance Overall balance assessment: Needs assistance   Sitting balance-Leahy Scale: Good     Standing balance support: No upper extremity supported, During functional activity Standing balance-Leahy Scale: Fair                             ADL either performed or assessed with clinical judgement   ADL Overall ADL's : Needs assistance/impaired Eating/Feeding: Independent;Sitting   Grooming: Set up;Sitting;Wash/dry hands   Upper Body Bathing: Set up;Sitting   Lower Body Bathing: Supervison/ safety;Sit to/from stand   Upper Body Dressing : Set up;Sitting   Lower Body Dressing: Set up;Sitting/lateral leans   Toilet Transfer: Supervision/safety;Ambulation   Toileting- Clothing Manipulation and Hygiene: Supervision/safety;Sit to/from stand       Functional mobility during ADLs: Supervision/safety General ADL Comments: pt reports dyspnea is typically with walking, not during ADLs     Vision Baseline Vision/History: 1 Wears glasses Ability to See in Adequate Light: 0 Adequate Patient Visual Report: No change from baseline       Perception     Praxis      Pertinent Vitals/Pain Pain Assessment Pain Assessment: No/denies pain     Hand Dominance Right   Extremity/Trunk Assessment  Upper Extremity Assessment Upper Extremity Assessment: Overall WFL for tasks assessed (educated pt to avoid lifting with R UE)   Lower Extremity Assessment Lower Extremity Assessment: Defer to PT evaluation   Cervical / Trunk Assessment Cervical / Trunk Assessment:  Normal   Communication Communication Communication: No difficulties   Cognition Arousal/Alertness: Awake/alert Behavior During Therapy: WFL for tasks assessed/performed Overall Cognitive Status: History of cognitive impairments - at baseline                                 General Comments: h/o of impaired memory, pt is aware, relies on wife to keep calendar and with filling pill box     General Comments       Exercises     Shoulder Instructions      Home Living Family/patient expects to be discharged to:: Private residence Living Arrangements: Spouse/significant other Available Help at Discharge: Family;Available 24 hours/day Type of Home: House Home Access: Stairs to enter [employer] of Steps: a couple Entrance Stairs-Rails: Left Home Layout: One level     Bathroom Shower/Tub: Producer, television/film/video: Handicapped height     Home Equipment: Cane - single point;Shower seat - built in          Prior Functioning/Environment Prior Level of Function : Independent/Modified Independent             Mobility Comments[facility], uses a cane intermitently. no falls ADLs Comments: stands to shower, wife helps him with med management        OT Problem List:        OT Treatment/Interventions:      OT Goals(Current goals can be found in the care plan section) Acute Rehab OT Goals Potential to Achieve Goals: Good  OT Frequency:      Co-evaluation              AM-PAC OT "6 Clicks" Daily Activity     Outcome Measure Help from another person eating meals?: None Help from another person taking care of personal grooming?: None Help from another person toileting, which includes using toliet, bedpan, or urinal?: None Help from another person bathing (including washing, rinsing, drying)?: None Help from another person to put on and taking off regular upper body clothing?: None Help from another  person to put on and taking off regular lower body clothing?: None 6 Click Score: 24   End of Session Equipment Utilized During Treatment: Gait belt  Activity Tolerance: Patient tolerated treatment well Patient left: in chair;with call bell/phone within reach;with chair alarm set  OT Visit Diagnosis: Other (comment) (decreased activity tolerance)                Time: 1028-1100 OT Time Calculation (min): 32 min Charges:  OT General Charges $OT Visit: 1 Visit OT Evaluation $OT Eval Low Complexity: 1 Low OT Treatments $Self Care/Home Management : 8-22 mins  Berna Spare, OTR/L Acute Rehabilitation Services Office: [phone]   Evern Bio 08/08/2022, 11:21 AM

## 2022-08-08 NOTE — Progress Notes (Signed)
Discharge instructions (including medications) discussed with and copy provided to patient/caregiver. IV removed by primary RN and patient assisted with dressing. Patient finishing lunch and will be discharged home with family.

## 2022-08-08 NOTE — Plan of Care (Signed)
  Problem: Activity: Goal: Capacity to carry out activities will improve Outcome: Adequate for Discharge   

## 2022-08-08 NOTE — Progress Notes (Addendum)
Patient Name: Derek Blackburn Date of Encounter: 08/08/2022 [facility] Cardiologist: Verne Carrow, MD   Interval Summary  .    Patient pleasant/well appearing this morning. Denies acute symptoms though remains concerned about having recurrent chest tightness and shortness of breath. Patient says that dyspnea was frequently occurring with exertion prior to his admission.   Vital Signs .    Vitals:   08/07/22 1805 08/07/22 1920 08/07/22 2003 08/08/22 0405  BP: (!) 148/94 (!) 150/97 (!) 155/104 132/84  Pulse:  88  90  Resp:  20 (!) 25 18  Temp:  97.9 F (36.6 C) 97.9 F (36.6 C) 97.9 F (36.6 C)  TempSrc:  Oral Oral Oral  SpO2:  95% 97% 95%  Weight:    67.7 kg  Height:        Intake/Output Summary (Last 24 hours) at 08/08/2022 0716 Last data filed at 08/08/2022 0300 Gross per 24 hour  Intake 389.3 ml  Output 525 ml  Net -135.7 ml      08/08/2022    4:05 AM 08/07/2022    4:40 AM 08/06/2022    8:54 AM  Last 3 Weights  Weight (lbs) 149 lb 4.8 oz 149 lb 9.6 oz 165 lb  Weight (kg) 67.722 kg 67.858 kg 74.844 kg      Telemetry/ECG    Sinus rhythm with isolated NSVT 6-7 beats in length. Isolated PVCs - Personally Reviewed  Physical Exam .   GEN: No acute distress.   Neck: No JVD Cardiac: RRR, no murmurs Respiratory: Clear to auscultation bilaterally. GI: Soft, nontender, non-distended  MS: No edema  Assessment & Plan .     Chest tightness Dyspnea Elevated troponin   Patient admitted with 2 separate episodes of chest discomfort/tightness and dyspnea with concurrent hypertension. High sensitivity troponin found to be 106->110. BNP elevated to 1601. ECG without acute ischemic changes. LHC/RHC on 7/10 with minimal nonobstructive CAD, mild aortic stenosis, normal LV filling pressures, PCWP 8/7 with mean 6 mmHg, mild pulmonary hypertension, normal RA pressure, normal CO/CI.   LHC shows troponin elevation is not due to CAD but rather likely demand  ischemia secondary to poorly controlled hypertension as patient also without evidence of CHF. Medical management/hypertension management recommended. Given minimal nonobstructive CAD, patient does not need ongoing ASA.  Given DM, continue statin therapy.  Hypertension  Patient with poorly regulated BP this admission. PTA med list shows Trandolapril 2mg , Amlodipine 10mg , Hydralazine 50mg  TID. Given non-ischemic LHC, this likely explains symptoms/elevated troponin. Coreg added with ACEi held in setting of AKI. BP marginally improved this morning.   Continue Amlodipine 10mg , Coreg 6.25mg  BID. Increase Hydralazine to 75mg  TID. Would consider resuming home ACEi tomorrow with stabilizing renal function.  Reviewed importance of low salt diet with patient. Suspect that his memory difficulty will make hypertension management more challenging.   Mild aortic stenosis  RHC showed mild aortic stenosis with mean AV gradient , AVA 2.05 cm2 with index 1.1. Do not think this is contributing to exertional dyspnea. Will need ongoing outpatient monitoring.   AKI on CKD IIIb  Creatinine elevated to 2.45 on admission, improved to 2.09 today. As above, would hold ACEi an additional day and consider resuming tomorrow with stable renal function.   Per primary team: DM Memory impairment For questions or updates, please contact [facility] Please consult [email or web address] for contact info under        Signed, Perlie Gold, PA-C   Agree with note by Perlie Gold PA-C  Pt admitted with CP/SOB. Trop  minimally elevated. 2D c/w mod AS. R/L heart cath showed minimal CAD with only mild AS. SCr stable. BP is still an issue. OK for DC homme with close OP follow up.  Runell Gess, M.D., FACP, [facility], [facility] [address] [place], [place]  [address]  [phone] 08/08/2022 2:44 PM

## 2022-08-10 LAB — LIPOPROTEIN A (LPA): Lipoprotein (a): 53 nmol/L — ABNORMAL HIGH (ref <75.0)

## 2022-08-13 ENCOUNTER — Other Ambulatory Visit: Payer: Self-pay

## 2022-08-13 ENCOUNTER — Observation Stay (HOSPITAL_COMMUNITY)
Admission: EM | Admit: 2022-08-13 | Discharge: 2022-08-15 | Disposition: A | Discharge status: Home / Self Care | Payer: Medicare Other | Attending: Family Medicine | Admitting: Family Medicine

## 2022-08-13 ENCOUNTER — Encounter (HOSPITAL_COMMUNITY): Payer: Self-pay

## 2022-08-13 ENCOUNTER — Emergency Department (HOSPITAL_COMMUNITY): Payer: Medicare Other

## 2022-08-13 DIAGNOSIS — Z79899 Other long term (current) drug therapy: Secondary | ICD-10-CM | POA: Insufficient documentation

## 2022-08-13 DIAGNOSIS — R2689 Other abnormalities of gait and mobility: Secondary | ICD-10-CM | POA: Insufficient documentation

## 2022-08-13 DIAGNOSIS — I160 Hypertensive urgency: Principal | ICD-10-CM | POA: Insufficient documentation

## 2022-08-13 DIAGNOSIS — N1832 Chronic kidney disease, stage 3b: Secondary | ICD-10-CM | POA: Insufficient documentation

## 2022-08-13 DIAGNOSIS — I739 Peripheral vascular disease, unspecified: Secondary | ICD-10-CM | POA: Diagnosis present

## 2022-08-13 DIAGNOSIS — R2681 Unsteadiness on feet: Secondary | ICD-10-CM | POA: Insufficient documentation

## 2022-08-13 DIAGNOSIS — Z7984 Long term (current) use of oral hypoglycemic drugs: Secondary | ICD-10-CM | POA: Diagnosis not present

## 2022-08-13 DIAGNOSIS — I129 Hypertensive chronic kidney disease with stage 1 through stage 4 chronic kidney disease, or unspecified chronic kidney disease: Secondary | ICD-10-CM | POA: Diagnosis not present

## 2022-08-13 DIAGNOSIS — N184 Chronic kidney disease, stage 4 (severe): Secondary | ICD-10-CM | POA: Diagnosis present

## 2022-08-13 DIAGNOSIS — E1122 Type 2 diabetes mellitus with diabetic chronic kidney disease: Secondary | ICD-10-CM | POA: Diagnosis not present

## 2022-08-13 DIAGNOSIS — R0609 Other forms of dyspnea: Secondary | ICD-10-CM

## 2022-08-13 DIAGNOSIS — R413 Other amnesia: Secondary | ICD-10-CM | POA: Diagnosis present

## 2022-08-13 DIAGNOSIS — Z1152 Encounter for screening for COVID-19: Secondary | ICD-10-CM | POA: Insufficient documentation

## 2022-08-13 DIAGNOSIS — I2721 Secondary pulmonary arterial hypertension: Secondary | ICD-10-CM

## 2022-08-13 DIAGNOSIS — M0579 Rheumatoid arthritis with rheumatoid factor of multiple sites without organ or systems involvement: Secondary | ICD-10-CM

## 2022-08-13 DIAGNOSIS — Z8546 Personal history of malignant neoplasm of prostate: Secondary | ICD-10-CM | POA: Diagnosis not present

## 2022-08-13 DIAGNOSIS — N183 Chronic kidney disease, stage 3 unspecified: Secondary | ICD-10-CM | POA: Diagnosis present

## 2022-08-13 DIAGNOSIS — R7989 Other specified abnormal findings of blood chemistry: Secondary | ICD-10-CM | POA: Insufficient documentation

## 2022-08-13 DIAGNOSIS — I10 Essential (primary) hypertension: Secondary | ICD-10-CM

## 2022-08-13 DIAGNOSIS — R0602 Shortness of breath: Secondary | ICD-10-CM | POA: Insufficient documentation

## 2022-08-13 HISTORY — DX: Rheumatoid arthritis with rheumatoid factor of multiple sites without organ or systems involvement: M05.79

## 2022-08-13 LAB — COMPREHENSIVE METABOLIC PANEL
ALT: 24 U/L (ref 0–44)
AST: 21 U/L (ref 15–41)
Albumin: 3.6 g/dL (ref 3.5–5.0)
Alkaline Phosphatase: 72 U/L (ref 38–126)
Anion gap: 13 (ref 5–15)
BUN: 25 mg/dL — ABNORMAL HIGH (ref 8–23)
CO2: 18 mmol/L — ABNORMAL LOW (ref 22–32)
Calcium: 9.9 mg/dL (ref 8.9–10.3)
Chloride: 107 mmol/L (ref 98–111)
Creatinine, Ser: 1.93 mg/dL — ABNORMAL HIGH (ref 0.61–1.24)
GFR, Estimated: 34 mL/min — ABNORMAL LOW (ref >60)
Glucose, Bld: 86 mg/dL (ref 70–99)
Potassium: 4.1 mmol/L (ref 3.5–5.1)
Sodium: 138 mmol/L (ref 135–145)
Total Bilirubin: 1 mg/dL (ref 0.3–1.2)
Total Protein: 7.8 g/dL (ref 6.5–8.1)

## 2022-08-13 LAB — RESP PANEL BY RT-PCR (RSV, FLU A&B, COVID)  RVPGX2
Influenza A by PCR: NEGATIVE
Influenza B by PCR: NEGATIVE
Resp Syncytial Virus by PCR: NEGATIVE
SARS Coronavirus 2 by RT PCR: NEGATIVE

## 2022-08-13 LAB — CBC
HCT: 36.4 % — ABNORMAL LOW (ref 39.0–52.0)
Hemoglobin: 11.1 g/dL — ABNORMAL LOW (ref 13.0–17.0)
MCH: 30.1 pg (ref 26.0–34.0)
MCHC: 30.5 g/dL (ref 30.0–36.0)
MCV: 98.6 fL (ref 80.0–100.0)
Platelets: 226 10*3/uL (ref 150–400)
RBC: 3.69 MIL/uL — ABNORMAL LOW (ref 4.22–5.81)
RDW: 13.9 % (ref 11.5–15.5)
WBC: 6.1 10*3/uL (ref 4.0–10.5)
nRBC: 0 % (ref 0.0–0.2)

## 2022-08-13 LAB — URINALYSIS, ROUTINE W REFLEX MICROSCOPIC
Bilirubin Urine: NEGATIVE
Glucose, UA: 150 mg/dL — AB
Hgb urine dipstick: NEGATIVE
Ketones, ur: NEGATIVE mg/dL
Leukocytes,Ua: NEGATIVE
Nitrite: NEGATIVE
Protein, ur: 100 mg/dL — AB
Specific Gravity, Urine: 1.014 (ref 1.005–1.030)
pH: 5 (ref 5.0–8.0)

## 2022-08-13 LAB — TROPONIN I (HIGH SENSITIVITY)
Troponin I (High Sensitivity): 45 ng/L — ABNORMAL HIGH (ref <18)
Troponin I (High Sensitivity): 49 ng/L — ABNORMAL HIGH (ref <18)

## 2022-08-13 LAB — BRAIN NATRIURETIC PEPTIDE: B Natriuretic Peptide: 1367.1 pg/mL — ABNORMAL HIGH (ref 0.0–100.0)

## 2022-08-13 MED ORDER — ATORVASTATIN CALCIUM 10 MG PO TABS
10.0000 mg | ORAL_TABLET | Freq: Every day | ORAL | Status: DC
  Administered 2022-08-14 – 2022-08-15 (×2): 10 mg via ORAL
  Filled 2022-08-13 (×2): qty 1

## 2022-08-13 MED ORDER — CARVEDILOL 12.5 MG PO TABS: 12.5000 mg | ORAL_TABLET | Freq: Two times a day (BID) | ORAL | Status: DC

## 2022-08-13 MED ORDER — OXYCODONE HCL 5 MG PO TABS: 5.0000 mg | ORAL_TABLET | ORAL | Status: DC | PRN

## 2022-08-13 MED ORDER — DONEPEZIL HCL 10 MG PO TABS
10.0000 mg | ORAL_TABLET | Freq: Every day | ORAL | Status: DC
  Administered 2022-08-14 – 2022-08-15 (×2): 10 mg via ORAL
  Filled 2022-08-13: qty 2
  Filled 2022-08-13: qty 1
  Filled 2022-08-13: qty 2
  Filled 2022-08-13: qty 1

## 2022-08-13 MED ORDER — ACETAMINOPHEN 650 MG RE SUPP: 650.0000 mg | Freq: Four times a day (QID) | RECTAL | Status: DC | PRN

## 2022-08-13 MED ORDER — SENNOSIDES-DOCUSATE SODIUM 8.6-50 MG PO TABS: 1.0000 | ORAL_TABLET | Freq: Every evening | ORAL | Status: DC | PRN

## 2022-08-13 MED ORDER — HYDRALAZINE HCL 25 MG PO TABS
75.0000 mg | ORAL_TABLET | Freq: Three times a day (TID) | ORAL | Status: DC
  Administered 2022-08-13 – 2022-08-15 (×6): 75 mg via ORAL
  Filled 2022-08-13 (×6): qty 3

## 2022-08-13 MED ORDER — ACETAMINOPHEN 325 MG PO TABS
650.0000 mg | ORAL_TABLET | Freq: Four times a day (QID) | ORAL | Status: DC | PRN
  Administered 2022-08-13 – 2022-08-15 (×3): 650 mg via ORAL
  Filled 2022-08-13 (×3): qty 2

## 2022-08-13 MED ORDER — LABETALOL HCL 5 MG/ML IV SOLN
10.0000 mg | INTRAVENOUS | Status: DC | PRN
  Administered 2022-08-13: 10 mg via INTRAVENOUS
  Filled 2022-08-13: qty 4

## 2022-08-13 MED ORDER — ONDANSETRON HCL 4 MG/2ML IJ SOLN: 4.0000 mg | Freq: Four times a day (QID) | INTRAMUSCULAR | Status: DC | PRN

## 2022-08-13 MED ORDER — AMLODIPINE BESYLATE 5 MG PO TABS
10.0000 mg | ORAL_TABLET | Freq: Every day | ORAL | Status: DC
  Administered 2022-08-14 – 2022-08-15 (×2): 10 mg via ORAL
  Filled 2022-08-13 (×2): qty 2

## 2022-08-13 MED ORDER — ONDANSETRON HCL 4 MG PO TABS: 4.0000 mg | ORAL_TABLET | Freq: Four times a day (QID) | ORAL | Status: DC | PRN

## 2022-08-13 MED ORDER — LABETALOL HCL 5 MG/ML IV SOLN
10.0000 mg | Freq: Once | INTRAVENOUS | Status: AC
  Administered 2022-08-13: 10 mg via INTRAVENOUS
  Filled 2022-08-13: qty 4

## 2022-08-13 MED ORDER — TRANDOLAPRIL 1 MG PO TABS
2.0000 mg | ORAL_TABLET | Freq: Every day | ORAL | Status: DC
  Administered 2022-08-14 – 2022-08-15 (×2): 2 mg via ORAL
  Filled 2022-08-13 (×2): qty 2

## 2022-08-13 MED ORDER — HEPARIN SODIUM (PORCINE) 5000 UNIT/ML IJ SOLN
5000.0000 [IU] | Freq: Three times a day (TID) | INTRAMUSCULAR | Status: DC
  Administered 2022-08-13 – 2022-08-15 (×6): 5000 [IU] via SUBCUTANEOUS
  Filled 2022-08-13 (×6): qty 1

## 2022-08-13 MED ORDER — LEFLUNOMIDE 10 MG PO TABS
20.0000 mg | ORAL_TABLET | Freq: Every day | ORAL | Status: DC
  Administered 2022-08-14 – 2022-08-15 (×2): 20 mg via ORAL
  Filled 2022-08-13 (×2): qty 2

## 2022-08-13 MED ORDER — SODIUM CHLORIDE 0.9% FLUSH
3.0000 mL | Freq: Two times a day (BID) | INTRAVENOUS | Status: DC
  Administered 2022-08-13 – 2022-08-15 (×4): 3 mL via INTRAVENOUS

## 2022-08-13 MED ORDER — TIMOLOL MALEATE 0.5 % OP SOLN
1.0000 [drp] | Freq: Two times a day (BID) | OPHTHALMIC | Status: DC
  Administered 2022-08-13 – 2022-08-15 (×4): 1 [drp] via OPHTHALMIC
  Filled 2022-08-13: qty 5

## 2022-08-13 MED ORDER — LATANOPROST 0.005 % OP SOLN
1.0000 [drp] | Freq: Every day | OPHTHALMIC | Status: DC
  Administered 2022-08-13 – 2022-08-14 (×2): 1 [drp] via OPHTHALMIC
  Filled 2022-08-13: qty 2.5

## 2022-08-13 MED ORDER — CARVEDILOL 3.125 MG PO TABS: 6.2500 mg | ORAL_TABLET | Freq: Two times a day (BID) | ORAL | Status: DC

## 2022-08-13 MED ORDER — FUROSEMIDE 10 MG/ML IJ SOLN
40.0000 mg | Freq: Once | INTRAMUSCULAR | Status: AC
  Administered 2022-08-13: 40 mg via INTRAVENOUS
  Filled 2022-08-13: qty 4

## 2022-08-13 MED ORDER — CARVEDILOL 12.5 MG PO TABS
12.5000 mg | ORAL_TABLET | Freq: Two times a day (BID) | ORAL | Status: DC
  Administered 2022-08-13 – 2022-08-15 (×4): 12.5 mg via ORAL
  Filled 2022-08-13 (×4): qty 1

## 2022-08-13 NOTE — ED Triage Notes (Addendum)
Pt evaluated for a "physical check up" last Tuesday; c/o sob since then; was told to follow up with PCP, but pt states they are on vacation; endorses back pain, chronic, and dyspnea with exertion ; denies cough, denies fever

## 2022-08-13 NOTE — H&P (Signed)
History and Physical    Derek Blackburn ATF:[id number] DOB: Jul 26, 1935 DOA: 08/13/2022  PCP: Diamantina Providence, FNP   Patient coming from: Home   Chief Complaint: SOB, chronic back pain   HPI: Derek Blackburn is a pleasant 87 y.o. male with medical history significant for hypertension, CKD 3B, prostate cancer, PAD, rheumatoid arthritis, memory impairment, and recent admission for chest pain with ACS ruled out who now presents for evaluation of ongoing exertional dyspnea and chronic back pain.  Patient was admitted to the hospital from 08/05/2022 until 08/08/2022 with chest pain and shortness of breath, had minimal nonobstructive CAD and mild AS on left and right heart cath.  He had elevated blood pressures during the hospitalization, hydralazine was increased from 50 to 75 mg 3 times daily, and Coreg was initiated.  He has continued to have exertional dyspnea since the discharge but denies any chest pain, cough, fever, chills, or leg swelling.  He denies missing any doses of his medications but notes that he had not taken any of his medicines yet today.  ED Course: Upon arrival to the ED, patient is found to be afebrile and saturating well on room air with normal heart rate and severely elevated blood pressures.  EKG demonstrates sinus rhythm with first-degree of nodal block and LAD.  Chest x-ray notable for cardiomegaly.  Labs are significant for bicarbonate 18, creatinine 1.93, troponin 45, and BNP 1367.  Patient was given 40 mg IV Lasix and 10 mg IV labetalol in the ED.  Review of Systems:  All other systems reviewed and apart from HPI, are negative.  Past Medical History:  Diagnosis Date   Anemia    CKD (chronic kidney disease)    Heart murmur    Hypertension    Hypertensive urgency    Peripheral polyneuropathy    Prostate cancer (HCC)    PVD (peripheral vascular disease) (HCC)    Rheumatoid arthritis involving multiple sites with positive rheumatoid factor (HCC)  08/13/2022    Past Surgical History:  Procedure Laterality Date   BACK SURGERY     PROSTATE BIOPSY     RIGHT/LEFT HEART CATH AND CORONARY ANGIOGRAPHY N/A 08/07/2022   Procedure: RIGHT/LEFT HEART CATH AND CORONARY ANGIOGRAPHY;  Surgeon: [place], Peter M, MD;  Location: [facility] INVASIVE CV LAB;  Service: Cardiovascular;  Laterality: N/A;    Social History:   reports that he has never smoked. He has never used smokeless tobacco. He reports current alcohol use. He reports that he does not use drugs.  No Known Allergies  Family History  Problem Relation Age of Onset   Breast cancer Neg Hx    Colon cancer Neg Hx    Prostate cancer Neg Hx    Pancreatic cancer Neg Hx      Prior to Admission medications   Medication Sig Start Date End Date Taking? Authorizing Provider  amLODipine (NORVASC) 10 MG tablet Take 1 tablet (10 mg total) by mouth daily. 06/03/22   Kathleene Hazel, MD  atorvastatin (LIPITOR) 10 MG tablet Take 1 tablet (10 mg total) by mouth daily. 08/09/22   Burnadette Pop, MD  carvedilol (COREG) 6.25 MG tablet Take 1 tablet (6.25 mg total) by mouth 2 (two) times daily with a meal. 08/08/22   Burnadette Pop, MD  donepezil (ARICEPT) 10 MG tablet Take 1 tablet (10 mg total) by mouth daily. 06/19/22   Marcos Eke, PA-C  hydrALAZINE (APRESOLINE) 25 MG tablet Take 3 tablets (75 mg total) by mouth 3 (three) times  daily. 08/08/22   Burnadette Pop, MD  JARDIANCE 10 MG TABS tablet Take 10 mg by mouth daily. 04/11/22   [provider]  latanoprost (XALATAN) 0.005 % ophthalmic solution Place 1 drop into both eyes at bedtime. 11/18/19   [provider]  leflunomide (ARAVA) 20 MG tablet Take 20 mg by mouth daily. 07/02/22   [provider]  Multiple Vitamin (MULITIVITAMIN WITH MINERALS) TABS Take 1 tablet by mouth daily.    [provider]  timolol (TIMOPTIC) 0.5 % ophthalmic solution 1 drop 2 (two) times daily. 03/23/22   [provider]   trandolapril (MAVIK) 2 MG tablet Take 2 mg by mouth daily.    [provider]  vitamin B-12 (CYANOCOBALAMIN) 100 MCG tablet Take 100 mcg by mouth daily.    [provider]  VITAMIN D, CHOLECALCIFEROL, PO Take 1 tablet by mouth daily.    [provider]    Physical Exam: Vitals:   08/13/22 1945 08/13/22 2015 08/13/22 2030 08/13/22 2045  BP: (!) 161/137 (!) 213/121 (!) 191/109 (!) 198/108  Pulse: 83 89 88 83  Resp: 19 (!) 22 19 (!) 22  Temp: (!) 97.5 F (36.4 C)     TempSrc: Oral     SpO2: 99% 98% 97% 97%  Weight:         Constitutional: NAD, calm  Eyes: PERTLA, lids and conjunctivae normal ENMT: Mucous membranes are moist. Posterior pharynx clear of any exudate or lesions.   Neck: supple, no masses  Respiratory: no wheezing, no crackles. No accessory muscle use.  Cardiovascular: S1 & S2 heard, regular rate and rhythm. No extremity edema.   Abdomen: No distension, no tenderness, soft. Bowel sounds active.  Musculoskeletal: no clubbing / cyanosis. No joint deformity upper and lower extremities.   Skin: no significant rashes, lesions, ulcers. Warm, dry, well-perfused. Neurologic: CN 2-12 grossly intact. Moving all extremities. Alert and oriented.  Psychiatric: Pleasant. Cooperative.    Labs and Imaging on Admission: I have personally reviewed following labs and imaging studies  CBC: Recent Labs  Lab 08/07/22 0054 08/07/22 1432 08/07/22 1436 08/07/22 1437 08/13/22 1643  WBC 7.6  --   --   --  6.1  HGB 10.6* 10.2* 10.9* 10.5* 11.1*  HCT 33.8* 30.0* 32.0* 31.0* 36.4*  MCV 96.3  --   --   --  98.6  PLT 226  --   --   --  226   Basic Metabolic Panel: Recent Labs  Lab 08/07/22 0054 08/07/22 1432 08/07/22 1436 08/07/22 1437 08/08/22 0142 08/13/22 1643  NA 138 142 142 141 140 138  K 3.9 4.0 4.1 4.0 4.1 4.1  CL 112*  --   --   --  113* 107  CO2 16*  --   --   --  17* 18*  GLUCOSE 97  --   --   --  97 86  BUN 32*  --   --   --  32* 25*   CREATININE 1.99*  --   --   --  2.09* 1.93*  CALCIUM 9.1  --   --   --  9.0 9.9   GFR: Estimated Creatinine Clearance: 28 mL/min (A) (by C-G formula based on SCr of 1.93 mg/dL (H)). Liver Function Tests: Recent Labs  Lab 08/07/22 0054 08/13/22 1643  AST 22 21  ALT 15 24  ALKPHOS 55 72  BILITOT 0.6 1.0  PROT 7.3 7.8  ALBUMIN 3.1* 3.6   No results for input(s): "LIPASE", "  AMYLASE" in the last 168 hours. No results for input(s): "AMMONIA" in the last 168 hours. Coagulation Profile: No results for input(s): "INR", "PROTIME" in the last 168 hours. Cardiac Enzymes: No results for input(s): "CKTOTAL", "CKMB", "CKMBINDEX", "TROPONINI" in the last 168 hours. BNP (last 3 results) No results for input(s): "PROBNP" in the last 8760 hours. HbA1C: No results for input(s): "HGBA1C" in the last 72 hours. CBG: No results for input(s): "GLUCAP" in the last 168 hours. Lipid Profile: No results for input(s): "CHOL", "HDL", "LDLCALC", "TRIG", "CHOLHDL", "LDLDIRECT" in the last 72 hours. Thyroid Function Tests: No results for input(s): "TSH", "T4TOTAL", "FREET4", "T3FREE", "THYROIDAB" in the last 72 hours. Anemia Panel: No results for input(s): "VITAMINB12", "FOLATE", "FERRITIN", "TIBC", "IRON", "RETICCTPCT" in the last 72 hours. Urine analysis:    Component Value Date/Time   COLORURINE YELLOW 08/13/2022 1734   APPEARANCEUR CLEAR 08/13/2022 1734   LABSPEC 1.014 08/13/2022 1734   PHURINE 5.0 08/13/2022 1734   GLUCOSEU 150 (A) 08/13/2022 1734   HGBUR NEGATIVE 08/13/2022 1734   BILIRUBINUR NEGATIVE 08/13/2022 1734   KETONESUR NEGATIVE 08/13/2022 1734   PROTEINUR 100 (A) 08/13/2022 1734   UROBILINOGEN 1.0 01/21/2011 1720   NITRITE NEGATIVE 08/13/2022 1734   LEUKOCYTESUR NEGATIVE 08/13/2022 1734   Sepsis Labs: @LABRCNTIP (procalcitonin:4,lacticidven:4) ) Recent Results (from the past 240 hour(s))  Resp panel by RT-PCR (RSV, Flu A&B, Covid) Anterior Nasal Swab     Status: None    Collection Time: 08/13/22  2:00 PM   Specimen: Anterior Nasal Swab  Result Value Ref Range Status   SARS Coronavirus 2 by RT PCR NEGATIVE NEGATIVE Final   Influenza A by PCR NEGATIVE NEGATIVE Final   Influenza B by PCR NEGATIVE NEGATIVE Final    Comment: (NOTE) The Xpert Xpress SARS-CoV-2/FLU/RSV plus assay is intended as an aid in the diagnosis of influenza from Nasopharyngeal swab specimens and should not be used as a sole basis for treatment. Nasal washings and aspirates are unacceptable for Xpert Xpress SARS-CoV-2/FLU/RSV testing.  Fact Sheet for Patients: BloggerCourse.com  Fact Sheet for Healthcare Providers: SeriousBroker.it  This test is not yet approved or cleared by the [place] FDA and has been authorized for detection and/or diagnosis of SARS-CoV-2 by FDA under an Emergency Use Authorization (EUA). This EUA will remain in effect (meaning this test can be used) for the duration of the COVID-19 declaration under Section 564(b)(1) of the Act, 21 U.S.C. section 360bbb-3(b)(1), unless the authorization is terminated or revoked.     Resp Syncytial Virus by PCR NEGATIVE NEGATIVE Final    Comment: (NOTE) Fact Sheet for Patients: BloggerCourse.com  Fact Sheet for Healthcare Providers: SeriousBroker.it  This test is not yet approved or cleared by the [place] FDA and has been authorized for detection and/or diagnosis of SARS-CoV-2 by FDA under an Emergency Use Authorization (EUA). This EUA will remain in effect (meaning this test can be used) for the duration of the COVID-19 declaration under Section 564(b)(1) of the Act, 21 U.S.C. section 360bbb-3(b)(1), unless the authorization is terminated or revoked.  Performed at [facility], [address], [place], [place] [address]      Radiological Exams on Admission: DG Chest 2 View  Result Date:  08/13/2022 CLINICAL DATA:  Shortness of breath EXAM: CHEST - 2 VIEW COMPARISON:  08/06/2022 FINDINGS: Cardiomegaly. No acute airspace disease, pleural effusion or pneumothorax. IMPRESSION: Cardiomegaly. Electronically Signed   By: Jasmine Pang M.D.   On: 08/13/2022 15:45    EKG: Independently reviewed. Sinus rhythm, 1st  degree AV block, LAD.   Assessment/Plan   1. Hypertensive urgency  - SBP as high as 213 and DBP 157 in ED without evidence for end-organ damage  - Hydralazine dose was increased and Coreg added during recent admission  - Given IV Lasix and IV labetalol in ED  - Increase Coreg dose, continue hydralazine, Norvasc, and trandolapril, continue IV labetalol as needed   2. CKD 3B  - SCr is 1.93 on admission, appears close to baseline  - Renally-dose medications and monitor   3. PAD - No acute ischemia  - Continue statin    4. Memory impairment  - Continue Aricept, use delirium precautions    5. Elevated troponin  - Troponin mildly elevated and flat  - There was only minimal non-obstructive CAD on recent cath  - Control BP, no further ischemic workup anticipated this admission   6. Rheumatoid arthritis  - Continue leflunomide     DVT prophylaxis: sq heparin  Code Status: Full  Level of Care: Level of care: Progressive Family Communication: None present   Disposition Plan:  Patient is from: Home  Anticipated d/c is to: Home  Anticipated d/c date is: 7/17 or 08/15/22 Patient currently: Pending BP-control  Consults called: None  Admission status: Observation     Briscoe Deutscher, MD [facility]  08/13/2022, 9:00 PM

## 2022-08-13 NOTE — ED Notes (Signed)
IV team at bedside 

## 2022-08-13 NOTE — ED Provider Triage Note (Signed)
Emergency Medicine Provider Triage Evaluation Note  PERCELL LAMBOY , a 87 y.o. male  was evaluated in triage.  Pt complains of SOB. SEEN recently for the same and tried to follow-up with his primary care physician however they are out of town on vacation.  He complains of exertional dyspnea without chest pain.  He also wanted someone to look at the site where his IV was placed a week ago.  Denies orthopnea or PND.  Review of Systems  Positive: Shortness of breath Negative: CHEST PAIN   Physical Exam  BP (!) 187/113 (BP Location: Left Arm)   Pulse 90   Temp 98 F (36.7 C)   Resp 16   SpO2 99%  Gen:   Awake, no distress   Resp:  Normal effort  MSK:   Moves extremities without difficulty  Other:  So sig edema.  Left antecubital scab from previous IV potential phlebitis no heat or redness  Medical Decision Making  Medically screening exam initiated at 1:30 PM.  Appropriate orders placed.  [facility] Judie Petit Parcel was informed that the remainder of the evaluation will be completed by another provider, this initial triage assessment does not replace that evaluation, and the importance of remaining in the ED until their evaluation is complete.     Arthor Captain, PA-C 08/13/22 1332

## 2022-08-13 NOTE — ED Provider Notes (Signed)
[facility] AT [facility] Provider Note   CSN: [id number] Arrival date & time: 08/13/22  1239     History  Chief Complaint  Patient presents with   Shortness of Breath    Derek Blackburn is a 87 y.o. male.   Shortness of Breath Patient presents with shortness of breath.  Recent admission in the hospital for shortness of breath and some chest pain.  Reportedly was not able to follow-up with PCP so is back here.  States really has not worsened since he left the hospital.     Home Medications Prior to Admission medications   Medication Sig Start Date End Date Taking? Authorizing Provider  amLODipine (NORVASC) 10 MG tablet Take 1 tablet (10 mg total) by mouth daily. 06/03/22   Kathleene Hazel, MD  atorvastatin (LIPITOR) 10 MG tablet Take 1 tablet (10 mg total) by mouth daily. 08/09/22   Burnadette Pop, MD  carvedilol (COREG) 6.25 MG tablet Take 1 tablet (6.25 mg total) by mouth 2 (two) times daily with a meal. 08/08/22   Burnadette Pop, MD  donepezil (ARICEPT) 10 MG tablet Take 1 tablet (10 mg total) by mouth daily. 06/19/22   Marcos Eke, PA-C  hydrALAZINE (APRESOLINE) 25 MG tablet Take 3 tablets (75 mg total) by mouth 3 (three) times daily. 08/08/22   Burnadette Pop, MD  JARDIANCE 10 MG TABS tablet Take 10 mg by mouth daily. 04/11/22   [provider]  latanoprost (XALATAN) 0.005 % ophthalmic solution Place 1 drop into both eyes at bedtime. 11/18/19   [provider]  leflunomide (ARAVA) 20 MG tablet Take 20 mg by mouth daily. 07/02/22   [provider]  Multiple Vitamin (MULITIVITAMIN WITH MINERALS) TABS Take 1 tablet by mouth daily.    [provider]  timolol (TIMOPTIC) 0.5 % ophthalmic solution 1 drop 2 (two) times daily. 03/23/22   [provider]  trandolapril (MAVIK) 2 MG tablet Take 2 mg by mouth daily.    [provider]  vitamin B-12 (CYANOCOBALAMIN) 100 MCG tablet Take 100 mcg  by mouth daily.    [provider]  VITAMIN D, CHOLECALCIFEROL, PO Take 1 tablet by mouth daily.    [provider]      Allergies    Patient has no known allergies.    Review of Systems   Review of Systems  Respiratory:  Positive for shortness of breath.     Physical Exam Updated Vital Signs BP (!) 161/137 (BP Location: Right Arm)   Pulse 83   Temp (!) 97.5 F (36.4 C) (Oral)   Resp 19   Wt 70.8 kg   SpO2 99%   BMI 21.76 kg/m  Physical Exam Vitals and nursing note reviewed.  Cardiovascular:     Rate and Rhythm: Regular rhythm.  Pulmonary:     Breath sounds: Normal breath sounds. No wheezing.  Skin:    Capillary Refill: Capillary refill takes less than 2 seconds.     Comments: Bleeding from IV site at right antecubital.  Neurological:     Mental Status: He is alert and oriented to person, place, and time.     ED Results / Procedures / Treatments   Labs (all labs ordered are listed, but only abnormal results are displayed) Labs Reviewed  COMPREHENSIVE METABOLIC PANEL - Abnormal; Notable for the following components:      Result Value   CO2 18 (*)    BUN 25 (*)    Creatinine,  Ser 1.93 (*)    GFR, Estimated 34 (*)    All other components within normal limits  CBC - Abnormal; Notable for the following components:   RBC 3.69 (*)    Hemoglobin 11.1 (*)    HCT 36.4 (*)    All other components within normal limits  BRAIN NATRIURETIC PEPTIDE - Abnormal; Notable for the following components:   B Natriuretic Peptide 1,367.1 (*)    All other components within normal limits  URINALYSIS, ROUTINE W REFLEX MICROSCOPIC - Abnormal; Notable for the following components:   Glucose, UA 150 (*)    Protein, ur 100 (*)    Bacteria, UA RARE (*)    All other components within normal limits  TROPONIN I (HIGH SENSITIVITY) - Abnormal; Notable for the following components:   Troponin I (High Sensitivity) 45 (*)    All other components within normal limits  TROPONIN  I (HIGH SENSITIVITY) - Abnormal; Notable for the following components:   Troponin I (High Sensitivity) 49 (*)    All other components within normal limits  RESP PANEL BY RT-PCR (RSV, FLU A&B, COVID)  [id number]    EKG EKG Interpretation Date/Time:  Tuesday August 13 2022 14:17:43 EDT Ventricular Rate:  81 PR Interval:  226 QRS Duration:  90 QT Interval:  368 QTC Calculation: 427 R Axis:   -32  Text Interpretation: Sinus rhythm with 1st degree A-V block Left axis deviation Moderate voltage criteria for LVH, may be normal variant ( R in aVL , Cornell product ) Abnormal ECG When compared with ECG of 06-Aug-2022 09:24, No significant change since last tracing Confirmed by Benjiman Core [phone]) on 08/13/2022 7:22:18 PM  Radiology DG Chest 2 View  Result Date: 08/13/2022 CLINICAL DATA:  Shortness of breath EXAM: CHEST - 2 VIEW COMPARISON:  08/06/2022 FINDINGS: Cardiomegaly. No acute airspace disease, pleural effusion or pneumothorax. IMPRESSION: Cardiomegaly. Electronically Signed   By: Jasmine Pang M.D.   On: 08/13/2022 15:45    Procedures Procedures    Medications Ordered in ED Medications  labetalol (NORMODYNE) injection 10 mg (has no administration in time range)  furosemide (LASIX) injection 40 mg (has no administration in time range)    ED Course/ Medical Decision Making/ A&P                             Medical Decision Making Risk Prescription drug management.   Patient shortness of breath.  Somewhat acute on chronic.  Does have hypertensive here.  Does have history of hypertension.  Will Get x-ray and basic blood work.  Reviewed recent discharge note.  Discussed with patient and his wife here.  Troponin elevated but not as high as previous.  However blood pressure is continuously elevated.  Systolics up to 200.  BNP is elevated.  And weight is up 6 pounds since discharge.  With the shortness of breath and blood pressure I think he would benefit from more urgent control of  blood pressure.  Will discuss with hospitalist for admission.   CRITICAL CARE Performed by: Benjiman Core Total critical care time: 30 minutes Critical care time was exclusive of separately billable procedures and treating other patients. Critical care was necessary to treat or prevent imminent or life-threatening deterioration. Critical care was time spent personally by me on the following activities: development of treatment plan with patient and/or surrogate as well as nursing, discussions with consultants, evaluation of patient's response to treatment, examination of patient, obtaining history from  patient or surrogate, ordering and performing treatments and interventions, ordering and review of laboratory studies, ordering and review of radiographic studies, pulse oximetry and re-evaluation of patient's condition.         Final Clinical Impression(s) / ED Diagnoses Final diagnoses:  Hypertension, unspecified type    Rx / DC Orders ED Discharge Orders     None         Benjiman Core, MD 08/13/22 2029

## 2022-08-13 NOTE — ED Notes (Signed)
ED TO INPATIENT HANDOFF REPORT  ED Nurse Name and Phone #: Sonny Masters [phone]  S Name/Age/Gender Derek Blackburn 87 y.o. male Room/Bed: RESUSC/RESUSC  Code Status   Code Status: Full Code  Home/SNF/Other Home Patient oriented to: self, place, time, and situation Is this baseline? Yes   Triage Complete: Triage complete  Chief Complaint Hypertensive urgency [I16.0]  Triage Note Pt evaluated for a "physical check up" last Tuesday; c/o sob since then; was told to follow up with PCP, but pt states they are on vacation; endorses back pain, chronic, and dyspnea with exertion ; denies cough, denies fever   Allergies No Known Allergies  Level of Care/Admitting Diagnosis ED Disposition     ED Disposition  Admit   Condition  --   Comment  Hospital Area: [facility] [[id number]]  Level of Care: Progressive [102]  Admit to Progressive based on following criteria: CARDIOVASCULAR & THORACIC of moderate stability with acute coronary syndrome symptoms/low risk myocardial infarction/hypertensive urgency/arrhythmias/heart failure potentially compromising stability and stable post cardiovascular intervention patients.  [facility] place patient in [facility] at [facility] or [facility] if equivalent level of care is available:: Yes  Covid Evaluation: Confirmed COVID Negative  Diagnosis: Hypertensive urgency [742595]  Admitting Physician: Briscoe Deutscher [[id number]]  Attending Physician: Briscoe Deutscher [[id number]]          B Medical/Surgery History Past Medical History:  Diagnosis Date   Anemia    CKD (chronic kidney disease)    Heart murmur    Hypertension    Hypertensive urgency    Peripheral polyneuropathy    Prostate cancer (HCC)    PVD (peripheral vascular disease) (HCC)    Rheumatoid arthritis involving multiple sites with positive rheumatoid factor (HCC) 08/13/2022   Past Surgical History:  Procedure Laterality Date   BACK SURGERY     PROSTATE BIOPSY      RIGHT/LEFT HEART CATH AND CORONARY ANGIOGRAPHY N/A 08/07/2022   Procedure: RIGHT/LEFT HEART CATH AND CORONARY ANGIOGRAPHY;  Surgeon: [place]az[place], Peter M, MD;  Location: [facility] INVASIVE CV LAB;  Service: Cardiovascular;  Laterality: N/A;     A IV Location/Drains/Wounds Patient Lines/Drains/Airways Status     Active Line/Drains/Airways     Name Placement date Placement time Site Days   Peripheral IV 08/13/22 20 G 2.5" Right;Medial;Upper Arm 08/13/22  1846  Arm  less than 1            Intake/Output Last 24 hours No intake or output data in the 24 hours ending 08/13/22 2109  Labs/Imaging Results for orders placed or performed during the hospital encounter of 08/13/22 (from the past 48 hour(s))  Resp panel by RT-PCR (RSV, Flu A&B, Covid) Anterior Nasal Swab     Status: None   Collection Time: 08/13/22  2:00 PM   Specimen: Anterior Nasal Swab  Result Value Ref Range   SARS Coronavirus 2 by RT PCR NEGATIVE NEGATIVE   Influenza A by PCR NEGATIVE NEGATIVE   Influenza B by PCR NEGATIVE NEGATIVE    Comment: (NOTE) The Xpert Xpress SARS-CoV-2/FLU/RSV plus assay is intended as an aid in the diagnosis of influenza from Nasopharyngeal swab specimens and should not be used as a sole basis for treatment. Nasal washings and aspirates are unacceptable for Xpert Xpress SARS-CoV-2/FLU/RSV testing.  Fact Sheet for Patients: BloggerCourse.com  Fact Sheet for Healthcare Providers: SeriousBroker.it  This test is not yet approved or cleared by the [place] FDA and has been authorized for detection and/or diagnosis of SARS-CoV-2 by FDA  under an Emergency Use Authorization (EUA). This EUA will remain in effect (meaning this test can be used) for the duration of the COVID-19 declaration under Section 564(b)(1) of the Act, 21 U.S.C. section 360bbb-3(b)(1), unless the authorization is terminated or revoked.     Resp Syncytial Virus by PCR NEGATIVE  NEGATIVE    Comment: (NOTE) Fact Sheet for Patients: BloggerCourse.com  Fact Sheet for Healthcare Providers: SeriousBroker.it  This test is not yet approved or cleared by the [place] FDA and has been authorized for detection and/or diagnosis of SARS-CoV-2 by FDA under an Emergency Use Authorization (EUA). This EUA will remain in effect (meaning this test can be used) for the duration of the COVID-19 declaration under Section 564(b)(1) of the Act, 21 U.S.C. section 360bbb-3(b)(1), unless the authorization is terminated or revoked.  Performed at [facility], [address] [place], [place] [address]   Comprehensive metabolic panel     Status: Abnormal   Collection Time: 08/13/22  4:43 PM  Result Value Ref Range   Sodium 138 135 - 145 mmol/L   Potassium 4.1 3.5 - 5.1 mmol/L   Chloride 107 98 - 111 mmol/L   CO2 18 (L) 22 - 32 mmol/L   Glucose, Bld 86 70 - 99 mg/dL    Comment: Glucose reference range applies only to samples taken after fasting for at least 8 hours.   BUN 25 (H) 8 - 23 mg/dL   Creatinine, Ser 6.04 (H) 0.61 - 1.24 mg/dL   Calcium 9.9 8.9 - 54.0 mg/dL   Total Protein 7.8 6.5 - 8.1 g/dL   Albumin 3.6 3.5 - 5.0 g/dL   AST 21 15 - 41 U/L   ALT 24 0 - 44 U/L   Alkaline Phosphatase 72 38 - 126 U/L   Total Bilirubin 1.0 0.3 - 1.2 mg/dL   GFR, Estimated 34 (L) >60 mL/min    Comment: (NOTE) Calculated using the CKD-EPI Creatinine Equation (2021)    Anion gap 13 5 - 15    Comment: Performed at [facility], [address] [place], [place] [address]  CBC     Status: Abnormal   Collection Time: 08/13/22  4:43 PM  Result Value Ref Range   WBC 6.1 4.0 - 10.5 K/uL   RBC 3.69 (L) 4.22 - 5.81 MIL/uL   Hemoglobin 11.1 (L) 13.0 - 17.0 g/dL   HCT 14.7 (L) 82.9 - 56.2 %   MCV 98.6 80.0 - 100.0 fL   MCH 30.1 26.0 - 34.0 pg   MCHC 30.5 30.0 - 36.0 g/dL   RDW 13.0 86.5 - 78.4 %   Platelets 226 150 - 400 K/uL    nRBC 0.0 0.0 - 0.2 %    Comment: Performed at [facility], [address], [place], [place] [address]  Brain natriuretic peptide     Status: Abnormal   Collection Time: 08/13/22  4:43 PM  Result Value Ref Range   B Natriuretic Peptide 1,367.1 (H) 0.0 - 100.0 pg/mL    Comment: Performed at [facility], [address], [place], [place] [address]  Troponin I (High Sensitivity)     Status: Abnormal   Collection Time: 08/13/22  4:43 PM  Result Value Ref Range   Troponin I (High Sensitivity) 45 (H) <18 ng/L    Comment: (NOTE) Elevated high sensitivity troponin I (hsTnI) values and significant  changes across serial measurements may suggest ACS but many other  chronic and acute conditions are  known to elevate hsTnI results.  Refer to the "Links" section for chest pain algorithms and additional  guidance. Performed at [facility], [address], [place], [place] [address]   Urinalysis, Routine w reflex microscopic -Urine, Clean Catch     Status: Abnormal   Collection Time: 08/13/22  5:34 PM  Result Value Ref Range   Color, Urine YELLOW YELLOW   APPearance CLEAR CLEAR   Specific Gravity, Urine 1.014 1.005 - 1.030   pH 5.0 5.0 - 8.0   Glucose, UA 150 (A) NEGATIVE mg/dL   Hgb urine dipstick NEGATIVE NEGATIVE   Bilirubin Urine NEGATIVE NEGATIVE   Ketones, ur NEGATIVE NEGATIVE mg/dL   Protein, ur 664 (A) NEGATIVE mg/dL   Nitrite NEGATIVE NEGATIVE   Leukocytes,Ua NEGATIVE NEGATIVE   RBC / HPF 0-5 0 - 5 RBC/hpf   WBC, UA 0-5 0 - 5 WBC/hpf   Bacteria, UA RARE (A) NONE SEEN   Squamous Epithelial / HPF 0-5 0 - 5 /HPF   Mucus PRESENT     Comment: Performed at [facility], [address] [place], [place] [address]  Troponin I (High Sensitivity)     Status: Abnormal   Collection Time: 08/13/22  6:41 PM  Result Value Ref Range   Troponin I (High Sensitivity) 49 (H) <18 ng/L    Comment: (NOTE) Elevated high sensitivity troponin I (hsTnI) values and significant   changes across serial measurements may suggest ACS but many other  chronic and acute conditions are known to elevate hsTnI results.  Refer to the "Links" section for chest pain algorithms and additional  guidance. Performed at [facility], [address] [place], [place] [address]    DG Chest 2 View  Result Date: 08/13/2022 CLINICAL DATA:  Shortness of breath EXAM: CHEST - 2 VIEW COMPARISON:  08/06/2022 FINDINGS: Cardiomegaly. No acute airspace disease, pleural effusion or pneumothorax. IMPRESSION: Cardiomegaly. Electronically Signed   By: Jasmine Pang M.D.   On: 08/13/2022 15:45    Pending Labs Unresulted Labs (From admission, onward)     Start     Ordered   08/14/22 0500  Basic metabolic panel  Daily,   R      08/13/22 2100   08/14/22 0500  CBC  Daily,   R      08/13/22 2100            Vitals/Pain Today's Vitals   08/13/22 2030 08/13/22 2045 08/13/22 2100 08/13/22 2101  BP: (!) 191/109 (!) 198/108 (!) 191/157 (!) 191/157  Pulse: 88 83 90   Resp: 19 (!) 22 20   Temp:      TempSrc:      SpO2: 97% 97% 98%   Weight:        Isolation Precautions No active isolations  Medications Medications  leflunomide (ARAVA) tablet 20 mg (has no administration in time range)  amLODipine (NORVASC) tablet 10 mg (has no administration in time range)  atorvastatin (LIPITOR) tablet 10 mg (has no administration in time range)  hydrALAZINE (APRESOLINE) tablet 75 mg (has no administration in time range)  trandolapril (MAVIK) tablet 2 mg (has no administration in time range)  donepezil (ARICEPT) tablet 10 mg (has no administration in time range)  latanoprost (XALATAN) 0.005 % ophthalmic solution 1 drop (has no administration in time range)  timolol (TIMOPTIC) 0.5 % ophthalmic solution 1 drop (has no administration in time range)  heparin injection 5,000 Units (has no administration in time range)  sodium chloride flush (  NS) 0.9 % injection 3 mL (3 mLs Intravenous Given 08/13/22  2106)  acetaminophen (TYLENOL) tablet 650 mg (has no administration in time range)    Or  acetaminophen (TYLENOL) suppository 650 mg (has no administration in time range)  oxyCODONE (Oxy IR/ROXICODONE) immediate release tablet 5 mg (has no administration in time range)  senna-docusate (Senokot-S) tablet 1 tablet (has no administration in time range)  ondansetron (ZOFRAN) tablet 4 mg (has no administration in time range)    Or  ondansetron (ZOFRAN) injection 4 mg (has no administration in time range)  carvedilol (COREG) tablet 12.5 mg (has no administration in time range)  labetalol (NORMODYNE) injection 10 mg (has no administration in time range)  labetalol (NORMODYNE) injection 10 mg (10 mg Intravenous Given 08/13/22 2101)  furosemide (LASIX) injection 40 mg (40 mg Intravenous Given 08/13/22 2101)    Mobility walks     Focused Assessments Cardiac Assessment Handoff:    Lab Results  Component Value Date   TROPONINI <0.30 01/21/2011   No results found for: "DDIMER" Does the Patient currently have chest pain? No    R Recommendations: See Admitting Provider Note  Report given to:   Additional Notes: Hypertensive. Labetalol and lasix just given.

## 2022-08-14 ENCOUNTER — Inpatient Hospital Stay (HOSPITAL_COMMUNITY)
Admission: RE | Admit: 2022-08-14 | Discharge: 2022-08-14 | Disposition: A | Discharge status: Home / Self Care | Payer: Medicare Other | Source: Ambulatory Visit | Attending: Nephrology | Admitting: Nephrology

## 2022-08-14 DIAGNOSIS — I160 Hypertensive urgency: Secondary | ICD-10-CM | POA: Diagnosis not present

## 2022-08-14 LAB — BASIC METABOLIC PANEL
Anion gap: 10 (ref 5–15)
BUN: 24 mg/dL — ABNORMAL HIGH (ref 8–23)
CO2: 19 mmol/L — ABNORMAL LOW (ref 22–32)
Calcium: 9.4 mg/dL (ref 8.9–10.3)
Chloride: 109 mmol/L (ref 98–111)
Creatinine, Ser: 1.9 mg/dL — ABNORMAL HIGH (ref 0.61–1.24)
GFR, Estimated: 34 mL/min — ABNORMAL LOW (ref >60)
Glucose, Bld: 154 mg/dL — ABNORMAL HIGH (ref 70–99)
Potassium: 3.2 mmol/L — ABNORMAL LOW (ref 3.5–5.1)
Sodium: 138 mmol/L (ref 135–145)

## 2022-08-14 LAB — CBC
HCT: 32.3 % — ABNORMAL LOW (ref 39.0–52.0)
Hemoglobin: 10.1 g/dL — ABNORMAL LOW (ref 13.0–17.0)
MCH: 30.1 pg (ref 26.0–34.0)
MCHC: 31.3 g/dL (ref 30.0–36.0)
MCV: 96.4 fL (ref 80.0–100.0)
Platelets: 209 10*3/uL (ref 150–400)
RBC: 3.35 MIL/uL — ABNORMAL LOW (ref 4.22–5.81)
RDW: 13.8 % (ref 11.5–15.5)
WBC: 7.3 10*3/uL (ref 4.0–10.5)
nRBC: 0 % (ref 0.0–0.2)

## 2022-08-14 LAB — MAGNESIUM: Magnesium: 1.9 mg/dL (ref 1.7–2.4)

## 2022-08-14 MED ORDER — POTASSIUM CHLORIDE CRYS ER 20 MEQ PO TBCR
40.0000 meq | EXTENDED_RELEASE_TABLET | Freq: Once | ORAL | Status: AC
  Administered 2022-08-14: 40 meq via ORAL
  Filled 2022-08-14: qty 2

## 2022-08-14 MED ORDER — CARVEDILOL 12.5 MG PO TABS: 12.5000 mg | ORAL_TABLET | Freq: Two times a day (BID) | ORAL | 2 refills | Status: DC

## 2022-08-14 NOTE — Evaluation (Signed)
Physical Therapy Evaluation Patient Details Name: Derek Blackburn MRN: [id number] DOB: Mar 05, 1935 Today's Date: 08/14/2022  History of Present Illness  87 yo male presents to [facility] on 08/13/2022 for evaluation of ongoing exertional dyspnea and chronic back pain. Pt admitted with hypertensive urgency. Recent admission 7/8-7/11 with chest pain and shortness of breath. PMH includes HTN, HLD, CKDIII, PVD, prostate cancer, memory impairment, anemia.  Clinical Impression  PTA, pt lives with his spouse, uses a cane intermittently and is independent with ADL's. Pt reports ~one month of exertional dyspnea and decreased activity tolerance[facility] On PT evaluation, pt ambulating 85 ft with no assistive device at a min guard assist level. Demonstrates decreased bilateral foot clearance and impaired posture. HR 83-104, SpO2 > 90% on RA, BP 118/81 (93) pre mobility, BP 169/100 (120) post mobility. Pt DOE 3/4. Presents with decreased cardiopulmonary endurance, impaired balance, weakness. Will continue to follow acutely to progress mobility as tolerated. Pt declining follow up therapy.      Assistance Recommended at Discharge PRN  If plan is discharge home, recommend the following:  Can travel by private vehicle  A little help with walking and/or transfers;A little help with bathing/dressing/bathroom;Direct supervision/assist for medications management;Assist for transportation;Help with stairs or ramp for entrance        Equipment Recommendations None recommended by PT  Recommendations for Other Services       Functional Status Assessment Patient has had a recent decline in their functional status and demonstrates the ability to make significant improvements in function in a reasonable and predictable amount of time.     Precautions / Restrictions Precautions Precautions: Fall Precaution Comments: DOE Restrictions Weight Bearing Restrictions: No       Mobility  Bed Mobility Overal bed mobility: Modified Independent             General bed mobility comments: Increased time    Transfers Overall transfer level: Needs assistance Equipment used: None Transfers: Sit to/from Stand Sit to Stand: Supervision           General transfer comment: safety, slow to rise    Ambulation/Gait Ambulation/Gait assistance: Min guard Gait Distance (Feet): 150 Feet Assistive device: None Gait Pattern/deviations: Step-through pattern, Decreased stride length, Trunk flexed, Shuffle Gait velocity: decreased Gait velocity interpretation: <1.31 ft/sec, indicative of household ambulator   General Gait Details: Decreased bilateral foot clearance, difficulty controlling anterior momentum with higher cadence, flexed posture. Min guard for safety. Cues for activity pacing  Stairs            Wheelchair Mobility     Tilt Bed    Modified Rankin (Stroke Patients Only)       Balance Overall balance assessment: Needs assistance   Sitting balance-Leahy Scale: Good     Standing balance support: No upper extremity supported, During functional activity Standing balance-Leahy Scale: Fair                               Pertinent Vitals/Pain Pain Assessment Pain Assessment: No/denies pain    Home Living Family/patient expects to be discharged to:: Private residence Living Arrangements: Spouse/significant other Available Help at Discharge: Family;Available 24 hours/day Type of Home: House Home Access: Stairs to enter Entrance Stairs-Rails: Left Entrance Stairs-Number of Steps: a couple   Home Layout: One level Home Equipment: Cane - single point;Shower seat - built in      Prior Function Prior Level of Function :  Independent/Modified Independent             Mobility Comments[facility], uses a cane intermitently. no falls ADLs Comments: stands to shower, wife helps him with med  management     Hand Dominance   Dominant Hand: Right    Extremity/Trunk Assessment   Upper Extremity Assessment Upper Extremity Assessment: Overall WFL for tasks assessed    Lower Extremity Assessment Lower Extremity Assessment: Generalized weakness    Cervical / Trunk Assessment Cervical / Trunk Assessment: Normal  Communication   Communication: No difficulties  Cognition Arousal/Alertness: Awake/alert Behavior During Therapy: WFL for tasks assessed/performed Overall Cognitive Status: History of cognitive impairments - at baseline                                 General Comments: h/o of impaired memory, pt is aware, relies on wife to keep calendar and with filling pill box        General Comments      Exercises     Assessment/Plan    PT Assessment Patient needs continued PT services  PT Problem List Decreased strength;Decreased activity tolerance;Decreased mobility;Decreased safety awareness;Cardiopulmonary status limiting activity;Decreased knowledge of use of DME;Decreased balance       PT Treatment Interventions DME instruction;Gait training;Functional mobility training;Stair training;Therapeutic activities;Therapeutic exercise;Balance training;Patient/family education    PT Goals (Current goals can be found in the Care Plan section)  Acute Rehab PT Goals Patient Stated Goal: to rest PT Goal Formulation: With patient Time For Goal Achievement: 08/28/22 Potential to Achieve Goals: Good    Frequency Min 1X/week     Co-evaluation               AM-PAC PT "6 Clicks" Mobility  Outcome Measure Help needed turning from your back to your side while in a flat bed without using bedrails?: None Help needed moving from lying on your back to sitting on the side of a flat bed without using bedrails?: None Help needed moving to and from a bed to a chair (including a wheelchair)?: A Little Help needed standing up from a chair using your arms  (e.g., wheelchair or bedside chair)?: A Little Help needed to walk in hospital room?: A Little Help needed climbing 3-5 steps with a railing? : A Little 6 Click Score: 20    End of Session Equipment Utilized During Treatment: Gait belt Activity Tolerance: Patient tolerated treatment well Patient left: in bed;with call bell/phone within reach;with bed alarm set Nurse Communication: Mobility status PT Visit Diagnosis: Unsteadiness on feet (R26.81);Other abnormalities of gait and mobility (R26.89)    Time: 2956-2130 PT Time Calculation (min) (ACUTE ONLY): 21 min   Charges:   PT Evaluation $PT Eval Low Complexity: 1 Low   PT General Charges $$ ACUTE PT VISIT: 1 Visit         Lillia Pauls, PT, DPT Acute Rehabilitation Services Office [phone]   Norval Morton 08/14/2022, 3:46 PM

## 2022-08-14 NOTE — Progress Notes (Signed)
Mobility Specialist Progress Note:   08/14/22 1624  Mobility  Activity Ambulated with assistance in hallway  Level of Assistance Contact guard assist, steadying assist  Assistive Device None  Distance Ambulated (ft) 100 ft  Activity Response Tolerated well  Mobility Referral Yes  $Mobility charge 1 Mobility  Mobility Specialist Start Time (ACUTE ONLY) 1540  Mobility Specialist Stop Time (ACUTE ONLY) 1600  Mobility Specialist Time Calculation (min) (ACUTE ONLY) 20 min    Pre Mobility: 83 HR , 137/92 BP , 98% SpO2 During Mobility: 89 HR , 95% SpO2 Post Mobility: 74 HR , 94% SpO2  Pt received in bed, agreeable to mobility. Standing rest break required d/t SOB, otherwise asymptomatic throughout. Pt returned to bed VSS and RN in room.   Leory Plowman  Mobility Specialist Please contact via [employer] office at [phone]

## 2022-08-14 NOTE — Discharge Instructions (Addendum)
[place],  You are in the hospital because of some trouble breathing with ambulating.  This appears to be related to your very high blood pressure which is causing your heart to have some trouble. During your workup, you also were noted to have some high blood pressure in the artery to your lungs; this may also be contributing to your symptoms. I have placed a referral to the pulmonologist (lung doctor).  Please continue blood pressure medications as prescribed.  Please follow-up with your primary care physician.

## 2022-08-14 NOTE — Discharge Summary (Addendum)
Physician Discharge Summary   Patient: Derek Blackburn MRN: [id number] DOB: October 12, 1935  Admit date:     08/13/2022  Discharge date: 08/15/22  Discharge Physician: Jacquelin Hawking, MD   PCP: Diamantina Providence, FNP   Recommendations at discharge:  PCP visit for hospital follow-up Pulmonology follow-up for dyspnea on exertion  Discharge Diagnoses: Principal Problem:   Hypertensive urgency Active Problems:   Memory impairment   Chronic kidney disease, stage 3 unspecified (HCC)   Peripheral vascular disease (HCC)   Rheumatoid arthritis involving multiple sites with positive rheumatoid factor (HCC)  Resolved Problems:   * No resolved hospital problems. [facility] Course: Derek Blackburn is a 87 y.o. male with a history of hypertension, CKD stage IIIb, prostate cancer, PAD, return arthritis, memory impairment.  Patient presented secondary to shortness of breath.  Patient found to have evidence of hypertensive urgency likely causing symptoms.  Patient was restarted on home antihypertensives with Coreg being increased.  Significant improvement in blood pressure.  Patient was ambulated without difficulty.  Patient to follow-up with primary care physician on discharge.  Assessment and Plan:  Hypertensive urgency Systolic blood pressure as high as 213 with diastolic blood pressure as high as 157 emergency department.  Associated troponin elevation suggesting demand ischemia.  Patient was managed with IV Lasix and labetalol in the emergency department.  Patient's home regimen restarted with the adjustment of increasing Coreg to 12.5 mg twice daily.  Hypertension significantly improved with resumption of home medication.  Continue home regimen on discharge; increase to Coreg 12.5 mg twice daily  Dyspnea on exertion Appears to be related to hypertensive urgency.  Currently resolved.  Patient ambulated with physical therapy with recommendation for no outpatient therapy recommendations.  CKD  stage IIIb Stable.  Hyperlipidemia Continue Lipitor.  Diabetes mellitus type 2 Unclear if controlled or uncontrolled. No available hemoglobin A1C. Continue Jardiance.  Memory impairment Resume home Aricept.  Rheumatoid arthritis Continue leflunomide  Pulmonary artery hypertension Noted on Transthoracic Echocardiogram performed on previous admission. Referral to pulmonology placed.   Consultants: None Procedures performed: None Disposition: Home Diet recommendation: Carb modified   DISCHARGE MEDICATION: Allergies as of 08/14/2022   No Known Allergies      Medication List     TAKE these medications    acetaminophen 650 MG CR tablet Commonly known as: TYLENOL Take 650 mg by mouth at bedtime.   amLODipine 10 MG tablet Commonly known as: NORVASC Take 1 tablet (10 mg total) by mouth daily.   atorvastatin 10 MG tablet Commonly known as: LIPITOR Take 1 tablet (10 mg total) by mouth daily.   CALCIUM 500 PO Take 500 mg by mouth daily.   carvedilol 12.5 MG tablet Commonly known as: COREG Take 1 tablet (12.5 mg total) by mouth 2 (two) times daily with a meal. Start taking on: August 15, 2022 What changed:  medication strength how much to take   donepezil 10 MG tablet Commonly known as: ARICEPT Take 1 tablet (10 mg total) by mouth daily.   Fish Oil 1200 MG Caps Take 1,200 mg by mouth daily.   hydrALAZINE 25 MG tablet Commonly known as: APRESOLINE Take 3 tablets (75 mg total) by mouth 3 (three) times daily.   Jardiance 10 MG Tabs tablet Generic drug: empagliflozin Take 10 mg by mouth daily.   latanoprost 0.005 % ophthalmic solution Commonly known as: XALATAN Place 1 drop into both eyes at bedtime.   leflunomide 20 MG tablet Commonly known as: ARAVA Take 20 mg by  mouth daily.   loratadine 10 MG tablet Commonly known as: CLARITIN Take 10 mg by mouth daily.   multivitamin with minerals Tabs tablet Take 1 tablet by mouth daily.   Prevagen 10 MG  Caps Generic drug: Apoaequorin Take 10 mg by mouth daily.   timolol 0.5 % ophthalmic solution Commonly known as: TIMOPTIC Place 1 drop into both eyes 2 (two) times daily.   trandolapril 4 MG tablet Commonly known as: MAVIK Take 4 mg by mouth daily.   vitamin B-12 100 MCG tablet Commonly known as: CYANOCOBALAMIN Take 100 mcg by mouth daily.   VITAMIN D (CHOLECALCIFEROL) PO Take 1 tablet by mouth daily.        Discharge Exam: BP (!) 150/109 (BP Location: Left Arm)   Pulse 74   Temp (!) 97.4 F (36.3 C) (Oral)   Resp 17   Ht 5\' 10"  (1.778 m)   Wt 66.5 kg   SpO2 96%   BMI 21.04 kg/m   General exam: Appears calm and comfortable Respiratory system: Clear to auscultation. Respiratory effort normal. Cardiovascular system: S1 & S2 heard, RRR. No murmurs. Gastrointestinal system: Abdomen is nondistended, soft and nontender. Normal bowel sounds heard. Central nervous system: Alert and oriented. No focal neurological deficits. Musculoskeletal: No edema. No calf tenderness Skin: No cyanosis. No rashes Psychiatry: Judgement and insight appear normal. Mood & affect appropriate.   Condition at discharge: stable  The results of significant diagnostics from this hospitalization (including imaging, microbiology, ancillary and laboratory) are listed below for reference.   Imaging Studies: DG Chest 2 View  Result Date: 08/13/2022 CLINICAL DATA:  Shortness of breath EXAM: CHEST - 2 VIEW COMPARISON:  08/06/2022 FINDINGS: Cardiomegaly. No acute airspace disease, pleural effusion or pneumothorax. IMPRESSION: Cardiomegaly. Electronically Signed   By: Jasmine Pang M.D.   On: 08/13/2022 15:45   CARDIAC CATHETERIZATION  Result Date: 08/07/2022   LV end diastolic pressure is normal.   Hemodynamic findings consistent with mild pulmonary hypertension.   There is mild aortic valve stenosis. Minimal nonobstructive CAD Mild aortic stenosis. Mean AV gradient 11 mm Hg. AVA 2.05 cm squared with  index 1.1 Normal LV filling pressures. LVEDP 7 mm Hg. PCWP 8/7 with mean 6 mm Hg Mild pulmonary HTN. PAP 60/14 with mean 32 mm Hg Normal RA pressure mean 9 mm Hg Normal cardiac output 5.73 L/min, index 3.08. Plan: medical management.   ECHOCARDIOGRAM COMPLETE  Result Date: 08/06/2022    ECHOCARDIOGRAM REPORT   Patient Name:   Derek Blackburn [facility] Date of Exam: 08/06/2022 Medical Rec #:  [id number]           Height:       71.0 in Accession #:    [id number]          Weight:       165.0 lb Date of Birth:  1935/08/19           BSA:          1.943 m Patient Age:    87 years            BP:           152/91 mmHg Patient Gender: M                   HR:           108 bpm. Exam Location:  Inpatient Procedure: 2D Echo, Color Doppler and Cardiac Doppler Indications:    chest pain. dyspnea  History:  Patient has prior history of Echocardiogram examinations, most                 recent 01/02/2022. PVD. chronic kidney disease.; Risk                 Factors:Dyslipidemia and Hypertension.  Sonographer:    Delcie Roch RDCS Referring Phys: [id number] Cecille Po MELVIN IMPRESSIONS  1. Left ventricular ejection fraction, by estimation, is 60 to 65%. The left ventricle has normal function. The left ventricle has no regional wall motion abnormalities. There is moderate concentric left ventricular hypertrophy. Left ventricular diastolic parameters are consistent with Grade I diastolic dysfunction (impaired relaxation).  2. Right ventricular systolic function is mildly reduced. The right ventricular size is normal. There is moderately elevated pulmonary artery systolic pressure. The estimated right ventricular systolic pressure is 51.4 mmHg.  3. Left atrial size was mildly dilated.  4. Right atrial size was mildly dilated.  5. The mitral valve is normal in structure. No evidence of mitral valve regurgitation. No evidence of mitral stenosis.  6. Tricuspid valve regurgitation is mild to moderate.  7. Hard to see aortic leaflet  morphology - possibly a bicuspid aortic valve. Doppler findings are consistent with low flow, low gradient moderate aortic stenosis. The aortic valve is abnormal. There is moderate calcification of the aortic valve. There is moderate thickening of the aortic valve. Aortic valve regurgitation is mild to moderate. Moderate aortic valve stenosis. Aortic valve mean gradient measures 14.0 mmHg. Aortic valve Vmax measures 2.61 m/s.  8. There is borderline dilatation of the ascending aorta, measuring 38 mm.  9. The inferior vena cava is normal in size with greater than 50% respiratory variability, suggesting right atrial pressure of 3 mmHg. Comparison(s): No significant change from prior study. Prior images reviewed side by side. FINDINGS  Left Ventricle: Left ventricular ejection fraction, by estimation, is 60 to 65%. The left ventricle has normal function. The left ventricle has no regional wall motion abnormalities. The left ventricular internal cavity size was normal in size. There is  moderate concentric left ventricular hypertrophy. Left ventricular diastolic parameters are consistent with Grade I diastolic dysfunction (impaired relaxation). Normal left ventricular filling pressure. Right Ventricle: The right ventricular size is normal. No increase in right ventricular wall thickness. Right ventricular systolic function is mildly reduced. There is moderately elevated pulmonary artery systolic pressure. The tricuspid regurgitant velocity is 3.48 m/s, and with an assumed right atrial pressure of 3 mmHg, the estimated right ventricular systolic pressure is 51.4 mmHg. Left Atrium: Left atrial size was mildly dilated. Right Atrium: Right atrial size was mildly dilated. Pericardium: There is no evidence of pericardial effusion. Mitral Valve: The mitral valve is normal in structure. No evidence of mitral valve regurgitation. No evidence of mitral valve stenosis. Tricuspid Valve: The tricuspid valve is normal in structure.  Tricuspid valve regurgitation is mild to moderate. Aortic Valve: Hard to see aortic leaflet morphology - possibly a bicuspid aortic valve. Doppler findings are consistent with low flow, low gradient moderate aortic stenosis. The aortic valve is abnormal. There is moderate calcification of the aortic valve. There is moderate thickening of the aortic valve. Aortic valve regurgitation is mild to moderate. Moderate aortic stenosis is present. Aortic valve mean gradient measures 14.0 mmHg. Aortic valve peak gradient measures 27.2 mmHg. Aortic valve area,  by VTI measures 1.44 cm. Pulmonic Valve: The pulmonic valve was grossly normal. Pulmonic valve regurgitation is mild. No evidence of pulmonic stenosis. Aorta: The aortic root is normal in  size and structure. There is borderline dilatation of the ascending aorta, measuring 38 mm. Venous: The inferior vena cava is normal in size with greater than 50% respiratory variability, suggesting right atrial pressure of 3 mmHg. IAS/Shunts: No atrial level shunt detected by color flow Doppler.  LEFT VENTRICLE PLAX 2D LVIDd:         3.40 cm   Diastology LVIDs:         2.30 cm   LV e' medial:    7.94 cm/s LV PW:         1.50 cm   LV E/e' medial:  6.8 LV IVS:        1.50 cm   LV e' lateral:   8.16 cm/s LVOT diam:     2.30 cm   LV E/e' lateral: 6.6 LV SV:         48 LV SV Index:   25 LVOT Area:     4.15 cm  RIGHT VENTRICLE            IVC RV Basal diam:  2.90 cm    IVC diam: 1.40 cm RV S prime:     9.46 cm/s TAPSE (M-mode): 1.1 cm LEFT ATRIUM             Index        RIGHT ATRIUM           Index LA diam:        3.30 cm 1.70 cm/m   RA Area:     19.30 cm LA Vol (A2C):   63.6 ml 32.73 ml/m  RA Volume:   53.40 ml  27.48 ml/m LA Vol (A4C):   35.2 ml 18.12 ml/m LA Biplane Vol: 48.1 ml 24.76 ml/m  AORTIC VALVE AV Area (Vmax):    1.40 cm AV Area (Vmean):   1.31 cm AV Area (VTI):     1.44 cm AV Vmax:           261.00 cm/s AV Vmean:          173.000 cm/s AV VTI:            0.334 m AV  Peak Grad:      27.2 mmHg AV Mean Grad:      14.0 mmHg LVOT Vmax:         87.90 cm/s LVOT Vmean:        54.400 cm/s LVOT VTI:          0.115 m LVOT/AV VTI ratio: 0.35  AORTA Ao Root diam: 3.70 cm Ao Asc diam:  3.80 cm MV E velocity: 54.03 cm/s  TRICUSPID VALVE                            TR Peak grad:   48.4 mmHg                            TR Vmax:        348.00 cm/s                             SHUNTS                            Systemic VTI:  0.12 m  Systemic Diam: 2.30 cm Thurmon Fair MD Electronically signed by Thurmon Fair MD Signature Date/Time: 08/06/2022/5:55:02 PM    Final    DG Chest Portable 1 View  Result Date: 08/06/2022 CLINICAL DATA:  No history provided EXAM: PORTABLE CHEST 1 VIEW COMPARISON:  CXR 07/20/21 FINDINGS: No pleural effusion. No pneumothorax. Borderline cardiomegaly unchanged. No radiographically apparent displaced rib fractures. Visualized upper abdomen is unremarkable. IMPRESSION: No focal airspace opacity Electronically Signed   By: Lorenza Cambridge M.D.   On: 08/06/2022 10:03    Microbiology: Results for orders placed or performed during the hospital encounter of 08/13/22  Resp panel by RT-PCR (RSV, Flu A&B, Covid) Anterior Nasal Swab     Status: None   Collection Time: 08/13/22  2:00 PM   Specimen: Anterior Nasal Swab  Result Value Ref Range Status   SARS Coronavirus 2 by RT PCR NEGATIVE NEGATIVE Final   Influenza A by PCR NEGATIVE NEGATIVE Final   Influenza B by PCR NEGATIVE NEGATIVE Final    Comment: (NOTE) The Xpert Xpress SARS-CoV-2/FLU/RSV plus assay is intended as an aid in the diagnosis of influenza from Nasopharyngeal swab specimens and should not be used as a sole basis for treatment. Nasal washings and aspirates are unacceptable for Xpert Xpress SARS-CoV-2/FLU/RSV testing.  Fact Sheet for Patients: BloggerCourse.com  Fact Sheet for Healthcare Providers: SeriousBroker.it  This  test is not yet approved or cleared by the [place] FDA and has been authorized for detection and/or diagnosis of SARS-CoV-2 by FDA under an Emergency Use Authorization (EUA). This EUA will remain in effect (meaning this test can be used) for the duration of the COVID-19 declaration under Section 564(b)(1) of the Act, 21 U.S.C. section 360bbb-3(b)(1), unless the authorization is terminated or revoked.     Resp Syncytial Virus by PCR NEGATIVE NEGATIVE Final    Comment: (NOTE) Fact Sheet for Patients: BloggerCourse.com  Fact Sheet for Healthcare Providers: SeriousBroker.it  This test is not yet approved or cleared by the [place] FDA and has been authorized for detection and/or diagnosis of SARS-CoV-2 by FDA under an Emergency Use Authorization (EUA). This EUA will remain in effect (meaning this test can be used) for the duration of the COVID-19 declaration under Section 564(b)(1) of the Act, 21 U.S.C. section 360bbb-3(b)(1), unless the authorization is terminated or revoked.  Performed at [facility], [address] [place], [place] [address]     Labs: CBC: Recent Labs  Lab 08/13/22 1643 08/14/22 0042  WBC 6.1 7.3  HGB 11.1* 10.1*  HCT 36.4* 32.3*  MCV 98.6 96.4  PLT 226 209   Basic Metabolic Panel: Recent Labs  Lab 08/08/22 0142 08/13/22 1643 08/14/22 0042 08/14/22 0058  NA 140 138 138  --   K 4.1 4.1 3.2*  --   CL 113* 107 109  --   CO2 17* 18* 19*  --   GLUCOSE 97 86 154*  --   BUN 32* 25* 24*  --   CREATININE 2.09* 1.93* 1.90*  --   CALCIUM 9.0 9.9 9.4  --   MG  --   --   --  1.9   Liver Function Tests: Recent Labs  Lab 08/13/22 1643  AST 21  ALT 24  ALKPHOS 72  BILITOT 1.0  PROT 7.8  ALBUMIN 3.6    Discharge time spent: 35 minutes.  Signed: Jacquelin Hawking, MD [facility] 08/14/2022

## 2022-08-14 NOTE — Hospital Course (Signed)
Derek Blackburn is a 87 y.o. male with a history of hypertension, CKD stage IIIb, prostate cancer, PAD, return arthritis, memory impairment.  Patient presented secondary to shortness of breath.  Patient found to have evidence of hypertensive urgency likely causing symptoms.  Patient was restarted on home antihypertensives with Coreg being increased.  Significant improvement in blood pressure.  Patient was ambulated without difficulty.  Patient to follow-up with primary care physician on discharge.

## 2022-08-15 ENCOUNTER — Observation Stay (HOSPITAL_COMMUNITY): Payer: Medicare Other

## 2022-08-15 DIAGNOSIS — R0609 Other forms of dyspnea: Secondary | ICD-10-CM | POA: Insufficient documentation

## 2022-08-15 HISTORY — DX: Other forms of dyspnea: R06.09

## 2022-08-15 LAB — CBC
HCT: 30.1 % — ABNORMAL LOW (ref 39.0–52.0)
Hemoglobin: 9.4 g/dL — ABNORMAL LOW (ref 13.0–17.0)
MCH: 30.1 pg (ref 26.0–34.0)
MCHC: 31.2 g/dL (ref 30.0–36.0)
MCV: 96.5 fL (ref 80.0–100.0)
Platelets: 208 10*3/uL (ref 150–400)
RBC: 3.12 MIL/uL — ABNORMAL LOW (ref 4.22–5.81)
RDW: 13.8 % (ref 11.5–15.5)
WBC: 5.5 10*3/uL (ref 4.0–10.5)
nRBC: 0 % (ref 0.0–0.2)

## 2022-08-15 LAB — BASIC METABOLIC PANEL
Anion gap: 6 (ref 5–15)
BUN: 36 mg/dL — ABNORMAL HIGH (ref 8–23)
CO2: 19 mmol/L — ABNORMAL LOW (ref 22–32)
Calcium: 8.9 mg/dL (ref 8.9–10.3)
Chloride: 110 mmol/L (ref 98–111)
Creatinine, Ser: 2.18 mg/dL — ABNORMAL HIGH (ref 0.61–1.24)
GFR, Estimated: 29 mL/min — ABNORMAL LOW (ref >60)
Glucose, Bld: 104 mg/dL — ABNORMAL HIGH (ref 70–99)
Potassium: 4.5 mmol/L (ref 3.5–5.1)
Sodium: 135 mmol/L (ref 135–145)

## 2022-08-15 NOTE — Progress Notes (Signed)
Mobility Specialist Progress Note:   08/15/22 1531  Mobility  Activity Ambulated with assistance in hallway  Level of Assistance Contact guard assist, steadying assist  Assistive Device None  Distance Ambulated (ft) 125 ft  Activity Response Tolerated well  Mobility Referral Yes  $Mobility charge 1 Mobility  Mobility Specialist Start Time (ACUTE ONLY) 1500  Mobility Specialist Stop Time (ACUTE ONLY) 1520  Mobility Specialist Time Calculation (min) (ACUTE ONLY) 20 min    Pre Mobility: 81 HR , 121/73 BP  During Mobility: 97 HR , 96% SpO2 Post Mobility: 79 HR , 98% SpO2  Pt received in bed, agreeable to mobility. Rest break required d/t SOB. Pt denied any feelings of pain or dizziness during session. Pt returned to bed with call bell in hand and VSS.   Leory Plowman  Mobility Specialist Please contact via [employer] office at [phone]

## 2022-08-15 NOTE — Plan of Care (Signed)
NA

## 2022-08-15 NOTE — Plan of Care (Signed)
  Problem: Activity: Goal: Ability to return to baseline activity level will improve Outcome: Progressing   Problem: Health Behavior/Discharge Planning: Goal: Ability to safely manage health-related needs after discharge will improve Outcome: Progressing   

## 2022-08-15 NOTE — Progress Notes (Signed)
Patient did not go home on 7/17 secondary to no transportation. Patient ambulated with pulse ox, maintaining 96% SpO2. Chest x-ray with no acute pulmonary process. Patient continues to be stable for discharge. Referral for pulmonology follow-up placed in setting of pulmonary artery hypertension noted on Transthoracic Echocardiogram prior to admission which may be contributing to dyspnea on exertion.  Jacquelin Hawking, MD [facility] 08/15/2022, 3:58 PM

## 2022-08-22 NOTE — Progress Notes (Signed)
Cardiology Office Note:  .   Date:  08/23/2022  ID:  Derek Blackburn, DOB [birth year], MRN [id number] PCP: Diamantina Providence, FNP  [facility] Providers Cardiologist:  Verne Carrow, MD {  History of Present Illness: .   Derek Blackburn is a 87 y.o. male with past medical history of anemia, CAD, HTN, prostate cancer and PAD who presents for follow-up appointment.  He was 11/2021 as a new consult.  He was evaluated for cardiac murmur.  No complaints.  Denied chest pain, dyspnea, lower extremity edema, and dizziness.  Echocardiogram was ordered with LVEF 60 to 65%, grade 1 DD, left and right atrial size was moderately dilated, mild to moderate TR, moderate aortic stenosis, borderline dilatation of ascending aorta measuring 38 mm.  He was seen by Dr. Clifton James 05/2022 and at that time he denies chest pain, dyspnea, palpitations, tremor edema, orthopnea, PND, dizziness, syncope, near syncope.  BP was elevated at 1 40-1 60 at home.  Presented to the ED 7/9 and was found to have a mildly elevated troponin, elevated BNP.  Right left heart catheterization was ordered showing minimal nonobstructive coronary artery disease.  Was chest pain-free and hemodynamically stable.  Cleared for discharge.  Ultimately, seen in the ED again 7/16  for shortness of breath.  Patient had evidence of hypertensive urgency that was likely causing symptoms.  Was restarted on home antihypertensives with carvedilol being increased.  Significant improvement of blood pressure.  Blood pressure was as high as 213 systolic.  Today, he tells me that his BP has been higher but well controlled today in the clinic. He has been tracking at home and usually 147/91.  Today in the clinic it is 128/74.  Recently in the ED and it was extremely high.  He is asked to continue his amlodipine 10 mg daily, carvedilol 12.5 mg twice a day, hydralazine 75 mg 3 times a day.  He did not know he was a diabetic and is taking  Jardiance.  The right side of his neck is swollen today.  We will plan to get an ultrasound for further evaluation.  We also discussed getting an ultrasound of his renal arteries to check why his blood pressure has been so difficult to control.  Recent testing including echocardiogram and cardiac catheterization reviewed today.  Moderate aortic valve stenosis.   Reports no shortness of breath nor dyspnea on exertion. Reports no chest pain, pressure, or tightness. No edema, orthopnea, PND. Reports no palpitations.   ROS: Pertinent ROS in HPI  Studies Reviewed: .       Cardiac catheterization 08/07/2022  LV end diastolic pressure is normal.   Hemodynamic findings consistent with mild pulmonary hypertension.   There is mild aortic valve stenosis.   Minimal nonobstructive CAD Mild aortic stenosis. Mean AV gradient 11 mm Hg. AVA 2.05 cm squared with index 1.1 Normal LV filling pressures. LVEDP 7 mm Hg. PCWP 8/7 with mean 6 mm Hg Mild pulmonary HTN. PAP 60/14 with mean 32 mm Hg Normal RA pressure mean 9 mm Hg Normal cardiac output 5.73 L/min, index 3.08.   Plan: medical management.      Physical Exam:   VS:  BP 128/74   Pulse 78   Ht 5\' 10"  (1.778 m)   Wt 150 lb 3.2 oz (68.1 kg)   SpO2 96%   BMI 21.55 kg/m    Wt Readings from Last 3 Encounters:  08/23/22 150 lb 3.2 oz (68.1 kg)  08/15/22 144 lb 10 oz (  65.6 kg)  08/08/22 149 lb 4.8 oz (67.7 kg)    GEN: Well nourished, well developed in no acute distress NECK: No JVD; No carotid bruits CARDIAC: RRR, + systolic murmur, rubs, gallops RESPIRATORY:  Clear to auscultation without rales, wheezing or rhonchi  ABDOMEN: Soft, non-tender, non-distended EXTREMITIES:  No edema; No deformity   ASSESSMENT AND PLAN: .   1.  Chest pain with negative cardiac catheterization -no new chest pain or SOB -continue current medications  2.  Hypertensive urgency -will order renal artery, neck US for fullness on the right side -BP better controlled  today -would recommend continuing current medications: Norvasc 10mg  daily, Lipitor 10mg  daily, coreg 12.5mg  BID, Hydralazine 75mg  TID  3.  Dyspnea on exertion -recent testing reviewed  -euvolemic on exam  4.  CKD stage IIIb -he does have follow-up with renal but needs to call and verify the date/time -2.1 creatinine  5.  Hyperlipidemia -when he sees PCP, he will have labs done  6.  Moderate Aortic valve stenosis  -will need annual echos  -no syncope or presyncope       Dispo: He can return in 3 months to see Dr. Clifton James or APP  Signed, Sharlene Dory, PA-C

## 2022-08-23 ENCOUNTER — Encounter: Payer: Self-pay | Admitting: Physician Assistant

## 2022-08-23 ENCOUNTER — Ambulatory Visit: Payer: Medicare Other | Attending: Physician Assistant | Admitting: Physician Assistant

## 2022-08-23 VITALS — BP 128/74 | HR 78 | Ht 70.0 in | Wt 150.2 lb

## 2022-08-23 DIAGNOSIS — I160 Hypertensive urgency: Secondary | ICD-10-CM

## 2022-08-23 DIAGNOSIS — R221 Localized swelling, mass and lump, neck: Secondary | ICD-10-CM

## 2022-08-23 DIAGNOSIS — E785 Hyperlipidemia, unspecified: Secondary | ICD-10-CM | POA: Diagnosis not present

## 2022-08-23 DIAGNOSIS — R0609 Other forms of dyspnea: Secondary | ICD-10-CM

## 2022-08-23 DIAGNOSIS — N1832 Chronic kidney disease, stage 3b: Secondary | ICD-10-CM

## 2022-08-23 DIAGNOSIS — I10 Essential (primary) hypertension: Secondary | ICD-10-CM

## 2022-08-23 NOTE — Patient Instructions (Signed)
Medication Instructions:  The current medical regimen is effective;  continue present plan and medications.  *If you need a refill on your cardiac medications before your next appointment, please call your pharmacy*   Testing/Procedures: Your physician has requested that you have a carotid duplex. This test is an ultrasound of the carotid arteries in your neck. It looks at blood flow through these arteries that supply the brain with blood. Allow one hour for this exam. There are no restrictions or special instructions.  Your physician has requested that you have a renal artery duplex. During this test, an ultrasound is used to evaluate blood flow to the kidneys. Allow one hour for this exam. Do not eat after midnight the day before and avoid carbonated beverages. Take your medications as you usually do.  Please check your blood pressure daily for 1 to 2 weeks then send the readings to [facility] for review.  Follow-Up: At [facility], you and your health needs are our priority.  As part of our continuing mission to provide you with exceptional heart care, we have created designated Provider Care Teams.  These Care Teams include your primary Cardiologist (physician) and Advanced Practice Providers (APPs -  Physician Assistants and Nurse Practitioners) who all work together to provide you with the care you need, when you need it.  We recommend signing up for the patient portal called "MyChart".  Sign up information is provided on this After Visit Summary.  MyChart is used to connect with patients for Virtual Visits (Telemedicine).  Patients are able to view lab/test results, encounter notes, upcoming appointments, etc.  Non-urgent messages can be sent to your provider as well.   To learn more about what you can do with MyChart, go to [email or web address].    Your next appointment:   3-4  months  Provider:   Jari Favre, PA or Dr Clifton James   Heart-Healthy Eating Plan Eating a healthy  diet is important for the health of your heart. A heart-healthy eating plan includes: Eating less unhealthy fats. Eating more healthy fats. Eating less salt in your food. Salt is also called sodium. Making other changes in your diet. Talk with your doctor or a diet specialist (dietitian) to create an eating plan that is right for you. What is my plan? Your doctor may recommend an eating plan that includes: Total fat: ______% or less of total calories a day. Saturated fat: ______% or less of total calories a day. Cholesterol: less than _________mg a day. Sodium: less than _________mg a day. What are tips for following this plan? Cooking Avoid frying your food. Try to bake, boil, grill, or broil it instead. You can also reduce fat by: Removing the skin from poultry. Removing all visible fats from meats. Steaming vegetables in water or broth. Meal planning  At meals, divide your plate into four equal parts: Fill one-half of your plate with vegetables and green salads. Fill one-fourth of your plate with whole grains. Fill one-fourth of your plate with lean protein foods. Eat 2-4 cups of vegetables per day. One cup of vegetables is: 1 cup (91 g) broccoli or cauliflower florets. 2 medium carrots. 1 large bell pepper. 1 large sweet potato. 1 large tomato. 1 medium white potato. 2 cups (150 g) raw leafy greens. Eat 1-2 cups of fruit per day. One cup of fruit is: 1 small apple 1 large banana 1 cup (237 g) mixed fruit, 1 large orange,  cup (82 g) dried fruit, 1 cup (240 mL) 100%  fruit juice. Eat more foods that have soluble fiber. These are apples, broccoli, carrots, beans, peas, and barley. Try to get 20-30 g of fiber per day. Eat 4-5 servings of nuts, legumes, and seeds per week: 1 serving of dried beans or legumes equals  cup (90 g) cooked. 1 serving of nuts is  oz (12 almonds, 24 pistachios, or 7 walnut halves). 1 serving of seeds equals  oz (8 g). General  information Eat more home-cooked food. Eat less restaurant, buffet, and fast food. Limit or avoid alcohol. Limit foods that are high in starch and sugar. Avoid fried foods. Lose weight if you are overweight. Keep track of how much salt (sodium) you eat. This is important if you have high blood pressure. Ask your doctor to tell you more about this. Try to add vegetarian meals each week. Fats Choose healthy fats. These include olive oil and canola oil, flaxseeds, walnuts, almonds, and seeds. Eat more omega-3 fats. These include salmon, mackerel, sardines, tuna, flaxseed oil, and ground flaxseeds. Try to eat fish at least 2 times each week. Check food labels. Avoid foods with trans fats or high amounts of saturated fat. Limit saturated fats. These are often found in animal products, such as meats, butter, and cream. These are also found in plant foods, such as palm oil, palm kernel oil, and coconut oil. Avoid foods with partially hydrogenated oils in them. These have trans fats. Examples are stick margarine, some tub margarines, cookies, crackers, and other baked goods. What foods should I eat? Fruits All fresh, canned (in natural juice), or frozen fruits. Vegetables Fresh or frozen vegetables (raw, steamed, roasted, or grilled). Green salads. Grains Most grains. Choose whole wheat and whole grains most of the time. Rice and pasta, including brown rice and pastas made with whole wheat. Meats and other proteins Lean, well-trimmed beef, veal, pork, and lamb. Chicken and [place] without skin. All fish and shellfish. Wild duck, rabbit, pheasant, and venison. Egg whites or low-cholesterol egg substitutes. Dried beans, peas, lentils, and tofu. Seeds and most nuts. Dairy Low-fat or nonfat cheeses, including ricotta and mozzarella. Skim or 1% milk that is liquid, powdered, or evaporated. Buttermilk that is made with low-fat milk. Nonfat or low-fat yogurt. Fats and oils Non-hydrogenated (trans-free)  margarines. Vegetable oils, including soybean, sesame, sunflower, olive, peanut, safflower, corn, canola, and cottonseed. Salad dressings or mayonnaise made with a vegetable oil. Beverages Mineral water. Coffee and tea. Diet carbonated beverages. Sweets and desserts Sherbet, gelatin, and fruit ice. Small amounts of dark chocolate. Limit all sweets and desserts. Seasonings and condiments All seasonings and condiments. The items listed above may not be a complete list of foods and drinks you can eat. Contact a dietitian for more options. What foods should I avoid? Fruits Canned fruit in heavy syrup. Fruit in cream or butter sauce. Fried fruit. Limit coconut. Vegetables Vegetables cooked in cheese, cream, or butter sauce. Fried vegetables. Grains Breads that are made with saturated or trans fats, oils, or whole milk. Croissants. Sweet rolls. Donuts. High-fat crackers, such as cheese crackers. Meats and other proteins Fatty meats, such as hot dogs, ribs, sausage, bacon, rib-eye roast or steak. High-fat deli meats, such as salami and bologna. Caviar. Domestic duck and goose. Organ meats, such as liver. Dairy Cream, sour cream, cream cheese, and creamed cottage cheese. Whole-milk cheeses. Whole or 2% milk that is liquid, evaporated, or condensed. Whole buttermilk. Cream sauce or high-fat cheese sauce. Yogurt that is made from whole milk. Fats and oils Meat fat,  or shortening. Cocoa butter, hydrogenated oils, palm oil, coconut oil, palm kernel oil. Solid fats and shortenings, including bacon fat, salt pork, lard, and butter. Nondairy cream substitutes. Salad dressings with cheese or sour cream. Beverages Regular sodas and juice drinks with added sugar. Sweets and desserts Frosting. Pudding. Cookies. Cakes. Pies. Milk chocolate or white chocolate. Buttered syrups. Full-fat ice cream or ice cream drinks. The items listed above may not be a complete list of foods and drinks to avoid. Contact a  dietitian for more information. Summary Heart-healthy meal planning includes eating less unhealthy fats, eating more healthy fats, and making other changes in your diet. Eat a balanced diet. This includes fruits and vegetables, low-fat or nonfat dairy, lean protein, nuts and legumes, whole grains, and heart-healthy oils and fats. This information is not intended to replace advice given to you by your health care provider. Make sure you discuss any questions you have with your health care provider. Document Revised: 02/19/2021 Document Reviewed: 02/19/2021 Elsevier Patient Education  2024 [employer]  Low-Sodium Eating Plan Salt (sodium) helps you keep a healthy balance of fluids in your body. Too much sodium can raise your blood pressure. It can also cause fluid and waste to be held in your body. Your health care provider or dietitian may recommend a low-sodium eating plan if you have high blood pressure (hypertension), kidney disease, liver disease, or heart failure. Eating less sodium can help lower your blood pressure and reduce swelling. It can also protect your heart, liver, and kidneys. What are tips for following this plan? Reading food labels  Check food labels for the amount of sodium per serving. If you eat more than one serving, you must multiply the listed amount by the number of servings. Choose foods with less than 140 milligrams (mg) of sodium per serving. Avoid foods with 300 mg of sodium or more per serving. Always check how much sodium is in a product, even if the label says "unsalted" or "no salt added." Shopping  Buy products labeled as "low-sodium" or "no salt added." Buy fresh foods. Avoid canned foods and pre-made or frozen meals. Avoid canned, cured, or processed meats. Buy breads that have less than 80 mg of sodium per slice. Cooking  Eat more home-cooked food. Try to eat less restaurant, buffet, and fast food. Try not to add salt when you cook. Use salt-free  seasonings or herbs instead of table salt or sea salt. Check with your provider or pharmacist before using salt substitutes. Cook with plant-based oils, such as canola, sunflower, or olive oil. Meal planning When eating at a restaurant, ask if your food can be made with less salt or no salt. Avoid dishes labeled as brined, pickled, cured, or smoked. Avoid dishes made with soy sauce, miso, or teriyaki sauce. Avoid foods that have monosodium glutamate (MSG) in them. MSG may be added to some restaurant food, sauces, soups, bouillon, and canned foods. Make meals that can be grilled, baked, poached, roasted, or steamed. These are often made with less sodium. General information Try to limit your sodium intake to 1,500-2,300 mg each day, or the amount told by your provider. What foods should I eat? Fruits Fresh, frozen, or canned fruit. Fruit juice. Vegetables Fresh or frozen vegetables. "No salt added" canned vegetables. "No salt added" tomato sauce and paste. Low-sodium or reduced-sodium tomato and vegetable juice. Grains Low-sodium cereals, such as oats, puffed wheat and rice, and shredded wheat. Low-sodium crackers. Unsalted rice. Unsalted pasta. Low-sodium bread. Whole grain breads  and whole grain pasta. Meats and other proteins Fresh or frozen meat, poultry, seafood, and fish. These should have no added salt. Low-sodium canned tuna and salmon. Unsalted nuts. Dried peas, beans, and lentils without added salt. Unsalted canned beans. Eggs. Unsalted nut butters. Dairy Milk. Soy milk. Cheese that is naturally low in sodium, such as ricotta cheese, fresh mozzarella, or Swiss cheese. Low-sodium or reduced-sodium cheese. Cream cheese. Yogurt. Seasonings and condiments Fresh and dried herbs and spices. Salt-free seasonings. Low-sodium mustard and ketchup. Sodium-free salad dressing. Sodium-free light mayonnaise. Fresh or refrigerated horseradish. Lemon juice. Vinegar. Other foods Homemade,  reduced-sodium, or low-sodium soups. Unsalted popcorn and pretzels. Low-salt or salt-free chips. The items listed above may not be all the foods and drinks you can have. Talk to a dietitian to learn more. What foods should I avoid? Vegetables Sauerkraut, pickled vegetables, and relishes. Olives. [place] fries. Onion rings. Regular canned vegetables, except low-sodium or reduced-sodium items. Regular canned tomato sauce and paste. Regular tomato and vegetable juice. Frozen vegetables in sauces. Grains Instant hot cereals. Bread stuffing, pancake, and biscuit mixes. Croutons. Seasoned rice or pasta mixes. Noodle soup cups. Boxed or frozen macaroni and cheese. Regular salted crackers. Self-rising flour. Meats and other proteins Meat or fish that is salted, canned, smoked, spiced, or pickled. Precooked or cured meat, such as sausages or meat loaves. Tomasa Blase. Ham. Pepperoni. Hot dogs. Corned beef. Chipped beef. Salt pork. Jerky. Pickled herring, anchovies, and sardines. Regular canned tuna. Salted nuts. Dairy Processed cheese and cheese spreads. Hard cheeses. Cheese curds. Blue cheese. Feta cheese. String cheese. Regular cottage cheese. Buttermilk. Canned milk. Fats and oils Salted butter. Regular margarine. Ghee. Bacon fat. Seasonings and condiments Onion salt, garlic salt, seasoned salt, table salt, and sea salt. Canned and packaged gravies. Worcestershire sauce. Tartar sauce. Barbecue sauce. Teriyaki sauce. Soy sauce, including reduced-sodium soy sauce. Steak sauce. Fish sauce. Oyster sauce. Cocktail sauce. Horseradish that you find on the shelf. Regular ketchup and mustard. Meat flavorings and tenderizers. Bouillon cubes. Hot sauce. Pre-made or packaged marinades. Pre-made or packaged taco seasonings. Relishes. Regular salad dressings. Salsa. Other foods Salted popcorn and pretzels. Corn chips and puffs. Potato and tortilla chips. Canned or dried soups. Pizza. Frozen entrees and pot pies. The items  listed above may not be all the foods and drinks you should avoid. Talk to a dietitian to learn more. This information is not intended to replace advice given to you by your health care provider. Make sure you discuss any questions you have with your health care provider. Document Revised: 01/31/2022 Document Reviewed: 01/31/2022 Elsevier Patient Education  2024 [employer].

## 2022-08-27 DIAGNOSIS — E119 Type 2 diabetes mellitus without complications: Secondary | ICD-10-CM

## 2022-08-27 DIAGNOSIS — I700 Atherosclerosis of aorta: Secondary | ICD-10-CM

## 2022-08-27 DIAGNOSIS — I272 Pulmonary hypertension, unspecified: Secondary | ICD-10-CM

## 2022-08-27 HISTORY — DX: Atherosclerosis of aorta: I70.0

## 2022-08-27 HISTORY — DX: Pulmonary hypertension, unspecified: I27.20

## 2022-08-27 HISTORY — DX: Type 2 diabetes mellitus without complications: E11.9

## 2022-08-28 ENCOUNTER — Encounter (HOSPITAL_COMMUNITY)
Admission: RE | Admit: 2022-08-28 | Discharge: 2022-08-28 | Disposition: A | Discharge status: Home / Self Care | Payer: Medicare Other | Source: Ambulatory Visit | Attending: Nephrology | Admitting: Nephrology

## 2022-08-28 ENCOUNTER — Encounter (HOSPITAL_COMMUNITY): Payer: Medicare Other

## 2022-08-28 VITALS — BP 117/81 | HR 86 | Temp 97.3°F | Resp 17

## 2022-08-28 DIAGNOSIS — N189 Chronic kidney disease, unspecified: Secondary | ICD-10-CM | POA: Insufficient documentation

## 2022-08-28 DIAGNOSIS — Z862 Personal history of diseases of the blood and blood-forming organs and certain disorders involving the immune mechanism: Secondary | ICD-10-CM | POA: Diagnosis present

## 2022-08-28 LAB — POCT HEMOGLOBIN-HEMACUE: Hemoglobin: 9.9 g/dL — ABNORMAL LOW (ref 13.0–17.0)

## 2022-08-28 MED ORDER — DARBEPOETIN ALFA 40 MCG/0.4ML IJ SOSY: 40.0000 ug | PREFILLED_SYRINGE | INTRAMUSCULAR | Status: DC

## 2022-08-28 MED ORDER — DARBEPOETIN ALFA 40 MCG/0.4ML IJ SOSY
PREFILLED_SYRINGE | INTRAMUSCULAR | Status: AC
  Administered 2022-08-28: 40 ug via SUBCUTANEOUS
  Filled 2022-08-28: qty 0.4

## 2022-09-03 ENCOUNTER — Other Ambulatory Visit: Payer: Self-pay

## 2022-09-03 ENCOUNTER — Encounter (HOSPITAL_COMMUNITY): Payer: Self-pay | Admitting: Emergency Medicine

## 2022-09-03 ENCOUNTER — Inpatient Hospital Stay (HOSPITAL_COMMUNITY)
Admission: EM | Admit: 2022-09-03 | Discharge: 2022-09-07 | DRG: 682 | Disposition: A | Discharge status: Home / Self Care | Payer: Medicare Other | Attending: Internal Medicine | Admitting: Internal Medicine

## 2022-09-03 ENCOUNTER — Emergency Department (HOSPITAL_COMMUNITY): Payer: Medicare Other

## 2022-09-03 DIAGNOSIS — I160 Hypertensive urgency: Secondary | ICD-10-CM | POA: Diagnosis present

## 2022-09-03 DIAGNOSIS — I129 Hypertensive chronic kidney disease with stage 1 through stage 4 chronic kidney disease, or unspecified chronic kidney disease: Secondary | ICD-10-CM | POA: Diagnosis present

## 2022-09-03 DIAGNOSIS — N1832 Chronic kidney disease, stage 3b: Secondary | ICD-10-CM | POA: Diagnosis present

## 2022-09-03 DIAGNOSIS — N281 Cyst of kidney, acquired: Secondary | ICD-10-CM | POA: Diagnosis present

## 2022-09-03 DIAGNOSIS — N179 Acute kidney failure, unspecified: Principal | ICD-10-CM | POA: Diagnosis present

## 2022-09-03 DIAGNOSIS — N183 Chronic kidney disease, stage 3 unspecified: Secondary | ICD-10-CM | POA: Diagnosis present

## 2022-09-03 DIAGNOSIS — I77810 Thoracic aortic ectasia: Secondary | ICD-10-CM | POA: Diagnosis present

## 2022-09-03 DIAGNOSIS — R011 Cardiac murmur, unspecified: Secondary | ICD-10-CM | POA: Diagnosis present

## 2022-09-03 DIAGNOSIS — Z862 Personal history of diseases of the blood and blood-forming organs and certain disorders involving the immune mechanism: Secondary | ICD-10-CM

## 2022-09-03 DIAGNOSIS — C61 Malignant neoplasm of prostate: Secondary | ICD-10-CM | POA: Diagnosis present

## 2022-09-03 DIAGNOSIS — I272 Pulmonary hypertension, unspecified: Secondary | ICD-10-CM | POA: Diagnosis present

## 2022-09-03 DIAGNOSIS — I10 Essential (primary) hypertension: Secondary | ICD-10-CM | POA: Diagnosis present

## 2022-09-03 DIAGNOSIS — I739 Peripheral vascular disease, unspecified: Secondary | ICD-10-CM | POA: Diagnosis present

## 2022-09-03 DIAGNOSIS — R7989 Other specified abnormal findings of blood chemistry: Secondary | ICD-10-CM | POA: Diagnosis present

## 2022-09-03 DIAGNOSIS — I2609 Other pulmonary embolism with acute cor pulmonale: Secondary | ICD-10-CM | POA: Diagnosis present

## 2022-09-03 DIAGNOSIS — R0602 Shortness of breath: Secondary | ICD-10-CM | POA: Diagnosis not present

## 2022-09-03 DIAGNOSIS — D631 Anemia in chronic kidney disease: Secondary | ICD-10-CM | POA: Diagnosis present

## 2022-09-03 DIAGNOSIS — I82409 Acute embolism and thrombosis of unspecified deep veins of unspecified lower extremity: Secondary | ICD-10-CM | POA: Insufficient documentation

## 2022-09-03 DIAGNOSIS — E872 Acidosis, unspecified: Secondary | ICD-10-CM | POA: Diagnosis present

## 2022-09-03 DIAGNOSIS — R0609 Other forms of dyspnea: Secondary | ICD-10-CM | POA: Diagnosis present

## 2022-09-03 DIAGNOSIS — Z7984 Long term (current) use of oral hypoglycemic drugs: Secondary | ICD-10-CM

## 2022-09-03 DIAGNOSIS — J479 Bronchiectasis, uncomplicated: Secondary | ICD-10-CM | POA: Diagnosis present

## 2022-09-03 DIAGNOSIS — G629 Polyneuropathy, unspecified: Secondary | ICD-10-CM | POA: Diagnosis present

## 2022-09-03 DIAGNOSIS — G4700 Insomnia, unspecified: Secondary | ICD-10-CM | POA: Diagnosis present

## 2022-09-03 DIAGNOSIS — Z1152 Encounter for screening for COVID-19: Secondary | ICD-10-CM

## 2022-09-03 DIAGNOSIS — N189 Chronic kidney disease, unspecified: Secondary | ICD-10-CM

## 2022-09-03 DIAGNOSIS — Z79899 Other long term (current) drug therapy: Secondary | ICD-10-CM

## 2022-09-03 DIAGNOSIS — N184 Chronic kidney disease, stage 4 (severe): Secondary | ICD-10-CM | POA: Diagnosis present

## 2022-09-03 DIAGNOSIS — M0579 Rheumatoid arthritis with rheumatoid factor of multiple sites without organ or systems involvement: Secondary | ICD-10-CM | POA: Diagnosis present

## 2022-09-03 DIAGNOSIS — I2699 Other pulmonary embolism without acute cor pulmonale: Secondary | ICD-10-CM | POA: Insufficient documentation

## 2022-09-03 LAB — BASIC METABOLIC PANEL
Anion gap: 16 — ABNORMAL HIGH (ref 5–15)
BUN: 50 mg/dL — ABNORMAL HIGH (ref 8–23)
CO2: 20 mmol/L — ABNORMAL LOW (ref 22–32)
Calcium: 9.6 mg/dL (ref 8.9–10.3)
Chloride: 107 mmol/L (ref 98–111)
Creatinine, Ser: 3.06 mg/dL — ABNORMAL HIGH (ref 0.61–1.24)
GFR, Estimated: 19 mL/min — ABNORMAL LOW (ref >60)
Glucose, Bld: 136 mg/dL — ABNORMAL HIGH (ref 70–99)
Potassium: 4.3 mmol/L (ref 3.5–5.1)
Sodium: 143 mmol/L (ref 135–145)

## 2022-09-03 LAB — D-DIMER, QUANTITATIVE: D-Dimer, Quant: 2.84 ug/mL-FEU — ABNORMAL HIGH (ref 0.00–0.50)

## 2022-09-03 LAB — I-STAT ARTERIAL BLOOD GAS, ED
Acid-base deficit: 3 mmol/L — ABNORMAL HIGH (ref 0.0–2.0)
Bicarbonate: 19.6 mmol/L — ABNORMAL LOW (ref 20.0–28.0)
Calcium, Ion: 1.04 mmol/L — ABNORMAL LOW (ref 1.15–1.40)
HCT: 26 % — ABNORMAL LOW (ref 39.0–52.0)
Hemoglobin: 8.8 g/dL — ABNORMAL LOW (ref 13.0–17.0)
O2 Saturation: 91 %
Patient temperature: 98.6
Potassium: 4.3 mmol/L (ref 3.5–5.1)
Sodium: 145 mmol/L (ref 135–145)
TCO2: 20 mmol/L — ABNORMAL LOW (ref 22–32)
pCO2 arterial: 26.9 mmHg — ABNORMAL LOW (ref 32–48)
pH, Arterial: 7.469 — ABNORMAL HIGH (ref 7.35–7.45)
pO2, Arterial: 55 mmHg — ABNORMAL LOW (ref 83–108)

## 2022-09-03 LAB — CBC
HCT: 32 % — ABNORMAL LOW (ref 39.0–52.0)
Hemoglobin: 9.6 g/dL — ABNORMAL LOW (ref 13.0–17.0)
MCH: 29.4 pg (ref 26.0–34.0)
MCHC: 30 g/dL (ref 30.0–36.0)
MCV: 97.9 fL (ref 80.0–100.0)
Platelets: 252 10*3/uL (ref 150–400)
RBC: 3.27 MIL/uL — ABNORMAL LOW (ref 4.22–5.81)
RDW: 13.3 % (ref 11.5–15.5)
WBC: 8.2 10*3/uL (ref 4.0–10.5)
nRBC: 0 % (ref 0.0–0.2)

## 2022-09-03 LAB — TROPONIN I (HIGH SENSITIVITY)
Troponin I (High Sensitivity): 36 ng/L — ABNORMAL HIGH (ref <18)
Troponin I (High Sensitivity): 41 ng/L — ABNORMAL HIGH (ref <18)

## 2022-09-03 LAB — BRAIN NATRIURETIC PEPTIDE: B Natriuretic Peptide: 1767.1 pg/mL — ABNORMAL HIGH (ref 0.0–100.0)

## 2022-09-03 LAB — LACTIC ACID, PLASMA: Lactic Acid, Venous: 1.3 mmol/L (ref 0.5–1.9)

## 2022-09-03 MED ORDER — ACETAMINOPHEN 325 MG PO TABS
650.0000 mg | ORAL_TABLET | Freq: Once | ORAL | Status: AC
  Administered 2022-09-03: 650 mg via ORAL
  Filled 2022-09-03: qty 2

## 2022-09-03 NOTE — ED Triage Notes (Signed)
Pt with increasing shob recently. Has been seen for same.  Was to followup with a pulmonologist but they do not have any appointments available.  Pt felt he could not wait and has had increasing work of breathing.  Pt tachypneic at time of triage.

## 2022-09-03 NOTE — ED Notes (Signed)
Pt ambulated on pulse ox. SPO2 95% and HR 114 while ambulating. Pt expresses that they feel "fine" but noted to have increased work of breathing while ambulating and a RR of 30.

## 2022-09-03 NOTE — ED Provider Notes (Signed)
[place] EMERGENCY DEPARTMENT AT [facility] Provider Note   CSN: [id number] Arrival date & time: 09/03/22  1456    History  Chief Complaint  Patient presents with   Shortness of Breath    Derek Blackburn is a 87 y.o. male history of anemia, CKD, hypertension, DOE here for evaluation of shortness of breath.  Has had some chronic shortness of breath however since discharge from the hospital has worsened.  States he is able to do very little without becoming short of breath and very fatigued.  Feels like he cannot take a deep breath gets short of breath when he speaks.  No tobacco use, no history of emphysema.  No pain or swelling to lower extremities.  No history of PE or DVT.  Feels like his symptoms have changed from baseline.  He otherwise feels well, no fever, cough, back pain, abdominal pain.  He eating and drinking without difficulty.  Follows with nephrology as well as cardiology.  Recently admitted on 7/16 for shortness of breath had elevated troponin and hypertensive urgency at that time.  Initially thought shortness of breath was due to his blood pressures.  He tells me his blood pressures have been well-controlled at home.  HPI     Home Medications Prior to Admission medications   Medication Sig Start Date End Date Taking? Authorizing Provider  acetaminophen (TYLENOL) 650 MG CR tablet Take 650 mg by mouth at bedtime.   Yes [provider]  amLODipine (NORVASC) 10 MG tablet Take 1 tablet (10 mg total) by mouth daily. 06/03/22  Yes Kathleene Hazel, MD  Apoaequorin (PREVAGEN) 10 MG CAPS Take 10 mg by mouth daily.   Yes [provider]  atorvastatin (LIPITOR) 10 MG tablet Take 1 tablet (10 mg total) by mouth daily. 08/09/22  Yes Burnadette Pop, MD  Calcium Carbonate (CALCIUM 500 PO) Take 500 mg by mouth daily.   Yes [provider]  carvedilol (COREG) 12.5 MG tablet Take 1 tablet (12.5 mg total) by mouth 2 (two) times daily with a  meal. 08/15/22  Yes Narda Bonds, MD  darolutamide (NUBEQA) 300 MG tablet Take 600 mg by mouth 2 (two) times daily with a meal. This medication treats Prostate Cancer.   Yes [provider]  donepezil (ARICEPT) 10 MG tablet Take 1 tablet (10 mg total) by mouth daily. 06/19/22  Yes Marcos Eke, PA-C  hydrALAZINE (APRESOLINE) 25 MG tablet Take 3 tablets (75 mg total) by mouth 3 (three) times daily. 08/08/22  Yes Adhikari, Willia Craze, MD  JARDIANCE 10 MG TABS tablet Take 10 mg by mouth daily. 04/11/22  Yes [provider]  latanoprost (XALATAN) 0.005 % ophthalmic solution Place 1 drop into both eyes at bedtime. 11/18/19  Yes [provider]  leflunomide (ARAVA) 20 MG tablet Take 20 mg by mouth daily. 07/02/22  Yes [provider]  loratadine (CLARITIN) 10 MG tablet Take 10 mg by mouth daily.   Yes [provider]  Multiple Vitamin (MULITIVITAMIN WITH MINERALS) TABS Take 1 tablet by mouth daily.   Yes [provider]  Omega-3 Fatty Acids (FISH OIL) 1200 MG CAPS Take 1,200 mg by mouth daily.   Yes [provider]  Potassium 99 MG TABS Take 99 mg by mouth daily.   Yes [provider]  timolol (TIMOPTIC) 0.5 % ophthalmic solution Place 1 drop into both eyes 2 (two) times daily. 03/23/22  Yes [provider]  trandolapril (MAVIK) 4 MG tablet Take 4  mg by mouth daily.   Yes [provider]  vitamin B-12 (CYANOCOBALAMIN) 100 MCG tablet Take 100 mcg by mouth daily.   Yes [provider]  VITAMIN D, CHOLECALCIFEROL, PO Take 1 tablet by mouth daily.   Yes [provider]      Allergies    Patient has no known allergies.    Review of Systems   Review of Systems  Constitutional: Negative.   HENT: Negative.    Respiratory:  Positive for shortness of breath. Negative for apnea, cough, choking, chest tightness, wheezing and stridor.   Cardiovascular: Negative.   Gastrointestinal: Negative.   Genitourinary:  Negative.   Musculoskeletal: Negative.   Skin: Negative.   Neurological: Negative.   All other systems reviewed and are negative.   Physical Exam Updated Vital Signs BP (!) 156/95   Pulse (!) 103   Temp 98.2 F (36.8 C) (Oral)   Resp (!) 33   SpO2 97%  Physical Exam Vitals and nursing note reviewed.  Constitutional:      General: He is not in acute distress.    Appearance: He is well-developed. He is not ill-appearing, toxic-appearing or diaphoretic.  HENT:     Head: Atraumatic.  Eyes:     Pupils: Pupils are equal, round, and reactive to light.  Cardiovascular:     Rate and Rhythm: Normal rate and regular rhythm.     Pulses: Normal pulses.     Heart sounds: Normal heart sounds.  Pulmonary:     Effort: Pulmonary effort is normal. No respiratory distress.     Breath sounds: Normal breath sounds.     Comments: Clear bil, Pauses to take of breath midsentence.  Appears visibly short of breath when speaking Chest:     Comments: Nontender chest wall Abdominal:     General: Bowel sounds are normal. There is no distension.     Palpations: Abdomen is soft.  Musculoskeletal:        General: Normal range of motion.     Cervical back: Normal range of motion and neck supple.     Right lower leg: No tenderness. No edema.     Left lower leg: No tenderness. No edema.  Skin:    General: Skin is warm and dry.     Capillary Refill: Capillary refill takes less than 2 seconds.  Neurological:     General: No focal deficit present.     Mental Status: He is alert and oriented to person, place, and time.    ED Results / Procedures / Treatments   Labs (all labs ordered are listed, but only abnormal results are displayed) Labs Reviewed  CBC - Abnormal; Notable for the following components:      Result Value   RBC 3.27 (*)    Hemoglobin 9.6 (*)    HCT 32.0 (*)    All other components within normal limits  BASIC METABOLIC PANEL - Abnormal; Notable for the following components:   CO2 20  (*)    Glucose, Bld 136 (*)    BUN 50 (*)    Creatinine, Ser 3.06 (*)    GFR, Estimated 19 (*)    Anion gap 16 (*)    All other components within normal limits  BRAIN NATRIURETIC PEPTIDE - Abnormal; Notable for the following components:   B Natriuretic Peptide 1,767.1 (*)    All other components within normal limits  D-DIMER, QUANTITATIVE - Abnormal; Notable for the following components:   D-Dimer, Quant 2.84 (*)  All other components within normal limits  I-STAT ARTERIAL BLOOD GAS, ED - Abnormal; Notable for the following components:   pH, Arterial 7.469 (*)    pCO2 arterial 26.9 (*)    pO2, Arterial 55 (*)    Bicarbonate 19.6 (*)    TCO2 20 (*)    Acid-base deficit 3.0 (*)    Calcium, Ion 1.04 (*)    HCT 26.0 (*)    Hemoglobin 8.8 (*)    All other components within normal limits  TROPONIN I (HIGH SENSITIVITY) - Abnormal; Notable for the following components:   Troponin I (High Sensitivity) 36 (*)    All other components within normal limits  TROPONIN I (HIGH SENSITIVITY) - Abnormal; Notable for the following components:   Troponin I (High Sensitivity) 41 (*)    All other components within normal limits  LACTIC ACID, PLASMA    EKG EKG Interpretation Date/Time:  Tuesday September 03 2022 15:21:07 EDT Ventricular Rate:  85 PR Interval:  208 QRS Duration:  74 QT Interval:  366 QTC Calculation: 435 R Axis:   -26  Text Interpretation: Normal sinus rhythm Left ventricular hypertrophy with repolarization abnormality ( R in aVL ) Abnormal ECG Confirmed by Vonita Moss [phone]) on 09/03/2022 3:27:37 PM  Radiology CT Chest Wo Contrast  Result Date: 09/03/2022 CLINICAL DATA:  Shortness of breath EXAM: CT CHEST WITHOUT CONTRAST TECHNIQUE: Multidetector CT imaging of the chest was performed following the standard protocol without IV contrast. RADIATION DOSE REDUCTION: This exam was performed according to the departmental dose-optimization program which includes automated exposure  control, adjustment of the mA and/or kV according to patient size and/or use of iterative reconstruction technique. COMPARISON:  None Available. FINDINGS: Cardiovascular: Cardiomegaly. No pericardial effusion. Normal caliber thoracic aorta moderate calcified plaque. Mildly dilated ascending thoracic aorta measuring up to 4.1 cm. Dilated main pulmonary artery, measuring up to 4.2 cm. Severe aortic valve calcifications. Mild coronary artery calcifications. Mediastinum/Nodes: Esophagus unremarkable. Enlarged and heterogeneous thyroid. Calcified mediastinal and hilar lymph nodes, likely sequela of prior granulomatous infection. No enlarged lymph nodes seen in the chest. Lungs/Pleura: Central airways are patent. Mild focal areas of bronchiectasis seen in the right lower lobe posterior right upper lobe with associated mild interstitial thickening and architectural distortion, likely sequela of prior infection or recurrent aspiration. No consolidation, pleural effusion or pneumothorax. Upper Abdomen: Bilateral nonobstructing renal stones. Multiple cystic lesions of the left kidney, likely simple cysts, no specific follow-up imaging is necessary. Musculoskeletal: No chest wall mass or suspicious bone lesions identified. IMPRESSION: 1. Mild focal areas of bronchiectasis seen in the right lower lobe and posterior right upper lobe with associated mild interstitial thickening and architectural distortion, likely sequela of prior infection or recurrent aspiration. 2. Dilated main pulmonary artery, which can be seen in the setting of pulmonary hypertension. 3. Severe aortic valve calcifications, findings can be seen in the setting of aortic stenosis. Correlate with echocardiography. 4. Mildly dilated ascending thoracic aorta measuring up to 4.1 cm. Recommend annual imaging followup by CTA or MRA. This recommendation follows 2010 ACCF/AHA/AATS/ACR/ASA/SCA/SCAI/SIR/STS/SVM Guidelines for the Diagnosis and Management of Patients  with Thoracic Aortic Disease. Circulation. 2010; 121: W295-A213. Aortic aneurysm NOS (ICD10-I71.9) 5. Enlarged and heterogeneous thyroid. Recommend non-emergent thyroid ultrasound if clinically warranted given patient age. 6. Cardiomegaly and aortic Atherosclerosis (ICD10-I70.0). Electronically Signed   By: Allegra Lai M.D.   On: 09/03/2022 18:49   DG Chest 2 View  Result Date: 09/03/2022 CLINICAL DATA:  Shortness of breath for 6 months.  Back pain.  EXAM: CHEST - 2 VIEW COMPARISON:  Radiographs 08/15/2022, 08/13/2022 and 07/20/2021. FINDINGS: The heart size and mediastinal contours are stable with cardiomegaly and aortic tortuosity. The trachea remains midline. The lungs are clear. There is no pleural effusion or pneumothorax. Stable mild degenerative changes throughout the spine without evidence of acute osseous abnormality. Telemetry leads overlie the chest. IMPRESSION: Stable cardiomegaly. No evidence of acute cardiopulmonary process. Electronically Signed   By: Carey Bullocks M.D.   On: 09/03/2022 16:28    Procedures Procedures    Medications Ordered in ED Medications  acetaminophen (TYLENOL) tablet 650 mg (650 mg Oral Given 09/03/22 1955)    ED Course/ Medical Decision Making/ A&P Clinical Course as of 09/03/22 2320  Tue Sep 03, 2022  2249 Dr. Julian Reil, rec ABG [BH]    Clinical Course User Index [BH] ,  A, PA-C   87 year old here for evaluation of shortness of breath.  Has some chronic ongoing dyspnea on exertion however worsened since his last hospital admission.  No cough.  No fever.  No lower extremity edema.  No PND orthopnea.  He appears visibly short of breath when holding conversation has to pause midsentence.  Oxygen at 91% however tachypneic into the high 20s, low 30s.  No history of PE or DVT however given recent hospitalization will plan on labs, imaging and reassess.  Labs and imaging personally viewed and interpreted:  CBC without leukocytosis, hemoglobin  9.6, similar to prior Metabolic panel creatinine 3.06, appears baseline is high nines, anion gap 16 BNP 1767.1 up from 3 weeks ago Delta troponin flat D-dimer 2.84 Chest x-ray without significant abnormality CT chest focal areas of bronchiectasis right lower lobe, right upper lobe with interstitial thickening likely sequela of prior infection versus recurrent aspiration, dilated pulmonary artery, suspect pulmonary hypertension, aortic valve calcifications can be seen in aortic stenosis correlate with echo, mildly dilated thoracic aorta  Patient reassessed.  He is ambulating did not become hypoxic however he became tachycardic, tachypneic and requested to return back to bed due to shortness of breath.  Unfortunately unable to perform CTA chest to rule out PE given his AKI. Will plan for admission for further workup and management.  CONSULT with Dr. Julian Reil with hospitalist, rec abg  ABG pH 7.469, bicarb 19.6  The patient appears reasonably stabilized for admission considering the current resources, flow, and capabilities available in the ED at this time, and I doubt any other [facility] requiring further screening and/or treatment in the ED prior to admission.                                 Medical Decision Making Amount and/or Complexity of Data Reviewed Independent Historian: spouse External Data Reviewed: labs, radiology, ECG and notes. Labs: ordered. Decision-making details documented in ED Course. Radiology: ordered and independent interpretation performed. Decision-making details documented in ED Course. ECG/medicine tests: ordered and independent interpretation performed. Decision-making details documented in ED Course.  Risk OTC drugs. Prescription drug management. Parenteral controlled substances. Decision regarding hospitalization. Diagnosis or treatment significantly limited by social determinants of health.           Final Clinical Impression(s) / ED Diagnoses Final  diagnoses:  SOB (shortness of breath)  Elevated brain natriuretic peptide (BNP) level  Elevated d-dimer  AKI (acute kidney injury) (HCC)    Rx / DC Orders ED Discharge Orders     None         ,   A, PA-C 09/03/22 2320    Lonell Grandchild, MD 09/12/22 1610

## 2022-09-03 NOTE — ED Notes (Signed)
William from lab will be running the BNP an first trop on the original blood work and I will draw the second trop at [employer]

## 2022-09-04 ENCOUNTER — Observation Stay (HOSPITAL_COMMUNITY): Payer: Medicare Other

## 2022-09-04 DIAGNOSIS — I77810 Thoracic aortic ectasia: Secondary | ICD-10-CM | POA: Diagnosis present

## 2022-09-04 DIAGNOSIS — I129 Hypertensive chronic kidney disease with stage 1 through stage 4 chronic kidney disease, or unspecified chronic kidney disease: Secondary | ICD-10-CM | POA: Diagnosis present

## 2022-09-04 DIAGNOSIS — R7989 Other specified abnormal findings of blood chemistry: Secondary | ICD-10-CM | POA: Diagnosis present

## 2022-09-04 DIAGNOSIS — D631 Anemia in chronic kidney disease: Secondary | ICD-10-CM | POA: Diagnosis present

## 2022-09-04 DIAGNOSIS — M0579 Rheumatoid arthritis with rheumatoid factor of multiple sites without organ or systems involvement: Secondary | ICD-10-CM | POA: Diagnosis present

## 2022-09-04 DIAGNOSIS — I739 Peripheral vascular disease, unspecified: Secondary | ICD-10-CM | POA: Diagnosis present

## 2022-09-04 DIAGNOSIS — E872 Acidosis, unspecified: Secondary | ICD-10-CM | POA: Diagnosis present

## 2022-09-04 DIAGNOSIS — R0602 Shortness of breath: Secondary | ICD-10-CM | POA: Diagnosis present

## 2022-09-04 DIAGNOSIS — R0609 Other forms of dyspnea: Secondary | ICD-10-CM

## 2022-09-04 DIAGNOSIS — Z1152 Encounter for screening for COVID-19: Secondary | ICD-10-CM | POA: Diagnosis not present

## 2022-09-04 DIAGNOSIS — N1832 Chronic kidney disease, stage 3b: Secondary | ICD-10-CM

## 2022-09-04 DIAGNOSIS — I160 Hypertensive urgency: Secondary | ICD-10-CM | POA: Diagnosis present

## 2022-09-04 DIAGNOSIS — G4700 Insomnia, unspecified: Secondary | ICD-10-CM | POA: Diagnosis present

## 2022-09-04 DIAGNOSIS — N179 Acute kidney failure, unspecified: Secondary | ICD-10-CM | POA: Diagnosis present

## 2022-09-04 DIAGNOSIS — I272 Pulmonary hypertension, unspecified: Secondary | ICD-10-CM | POA: Diagnosis present

## 2022-09-04 DIAGNOSIS — R011 Cardiac murmur, unspecified: Secondary | ICD-10-CM | POA: Diagnosis present

## 2022-09-04 DIAGNOSIS — Z86711 Personal history of pulmonary embolism: Secondary | ICD-10-CM | POA: Diagnosis not present

## 2022-09-04 DIAGNOSIS — I10 Essential (primary) hypertension: Secondary | ICD-10-CM

## 2022-09-04 DIAGNOSIS — N281 Cyst of kidney, acquired: Secondary | ICD-10-CM | POA: Diagnosis present

## 2022-09-04 DIAGNOSIS — Z79899 Other long term (current) drug therapy: Secondary | ICD-10-CM | POA: Diagnosis not present

## 2022-09-04 DIAGNOSIS — I2609 Other pulmonary embolism with acute cor pulmonale: Secondary | ICD-10-CM | POA: Diagnosis present

## 2022-09-04 DIAGNOSIS — Z7984 Long term (current) use of oral hypoglycemic drugs: Secondary | ICD-10-CM | POA: Diagnosis not present

## 2022-09-04 DIAGNOSIS — N189 Chronic kidney disease, unspecified: Secondary | ICD-10-CM | POA: Diagnosis not present

## 2022-09-04 DIAGNOSIS — C61 Malignant neoplasm of prostate: Secondary | ICD-10-CM | POA: Diagnosis present

## 2022-09-04 DIAGNOSIS — J479 Bronchiectasis, uncomplicated: Secondary | ICD-10-CM | POA: Diagnosis present

## 2022-09-04 DIAGNOSIS — Z862 Personal history of diseases of the blood and blood-forming organs and certain disorders involving the immune mechanism: Secondary | ICD-10-CM

## 2022-09-04 DIAGNOSIS — G629 Polyneuropathy, unspecified: Secondary | ICD-10-CM | POA: Diagnosis present

## 2022-09-04 HISTORY — DX: Acute kidney failure, unspecified: N17.9

## 2022-09-04 LAB — CBC
HCT: 30.3 % — ABNORMAL LOW (ref 39.0–52.0)
Hemoglobin: 9.1 g/dL — ABNORMAL LOW (ref 13.0–17.0)
MCH: 29 pg (ref 26.0–34.0)
MCHC: 30 g/dL (ref 30.0–36.0)
MCV: 96.5 fL (ref 80.0–100.0)
Platelets: 231 10*3/uL (ref 150–400)
RBC: 3.14 MIL/uL — ABNORMAL LOW (ref 4.22–5.81)
RDW: 13.6 % (ref 11.5–15.5)
WBC: 9.3 10*3/uL (ref 4.0–10.5)
nRBC: 0 % (ref 0.0–0.2)

## 2022-09-04 LAB — BASIC METABOLIC PANEL
Anion gap: 15 (ref 5–15)
BUN: 47 mg/dL — ABNORMAL HIGH (ref 8–23)
CO2: 19 mmol/L — ABNORMAL LOW (ref 22–32)
Calcium: 9.2 mg/dL (ref 8.9–10.3)
Chloride: 109 mmol/L (ref 98–111)
Creatinine, Ser: 2.63 mg/dL — ABNORMAL HIGH (ref 0.61–1.24)
GFR, Estimated: 23 mL/min — ABNORMAL LOW (ref >60)
Glucose, Bld: 147 mg/dL — ABNORMAL HIGH (ref 70–99)
Potassium: 4 mmol/L (ref 3.5–5.1)
Sodium: 143 mmol/L (ref 135–145)

## 2022-09-04 LAB — SARS CORONAVIRUS 2 BY RT PCR: SARS Coronavirus 2 by RT PCR: NEGATIVE

## 2022-09-04 MED ORDER — LORATADINE 10 MG PO TABS
10.0000 mg | ORAL_TABLET | Freq: Every day | ORAL | Status: DC
  Administered 2022-09-04 – 2022-09-07 (×4): 10 mg via ORAL
  Filled 2022-09-04 (×4): qty 1

## 2022-09-04 MED ORDER — DONEPEZIL HCL 10 MG PO TABS
10.0000 mg | ORAL_TABLET | Freq: Every day | ORAL | Status: DC
  Administered 2022-09-04 – 2022-09-07 (×4): 10 mg via ORAL
  Filled 2022-09-04 (×4): qty 1

## 2022-09-04 MED ORDER — LATANOPROST 0.005 % OP SOLN
1.0000 [drp] | Freq: Every day | OPHTHALMIC | Status: DC
  Administered 2022-09-04 – 2022-09-06 (×4): 1 [drp] via OPHTHALMIC
  Filled 2022-09-04: qty 2.5

## 2022-09-04 MED ORDER — MELATONIN 3 MG PO TABS
3.0000 mg | ORAL_TABLET | Freq: Every evening | ORAL | Status: DC | PRN
  Administered 2022-09-04: 3 mg via ORAL
  Filled 2022-09-04: qty 1

## 2022-09-04 MED ORDER — AMLODIPINE BESYLATE 10 MG PO TABS
10.0000 mg | ORAL_TABLET | Freq: Every day | ORAL | Status: DC
  Administered 2022-09-04 – 2022-09-07 (×4): 10 mg via ORAL
  Filled 2022-09-04 (×4): qty 1

## 2022-09-04 MED ORDER — ENOXAPARIN SODIUM 30 MG/0.3ML IJ SOSY
30.0000 mg | PREFILLED_SYRINGE | INTRAMUSCULAR | Status: DC
  Administered 2022-09-04 – 2022-09-05 (×2): 30 mg via SUBCUTANEOUS
  Filled 2022-09-04 (×2): qty 0.3

## 2022-09-04 MED ORDER — ATORVASTATIN CALCIUM 10 MG PO TABS
10.0000 mg | ORAL_TABLET | Freq: Every day | ORAL | Status: DC
  Administered 2022-09-04 – 2022-09-07 (×4): 10 mg via ORAL
  Filled 2022-09-04 (×4): qty 1

## 2022-09-04 MED ORDER — ACETAMINOPHEN 325 MG PO TABS
650.0000 mg | ORAL_TABLET | Freq: Four times a day (QID) | ORAL | Status: DC | PRN
  Administered 2022-09-05 – 2022-09-07 (×4): 650 mg via ORAL
  Filled 2022-09-04 (×4): qty 2

## 2022-09-04 MED ORDER — LEFLUNOMIDE 10 MG PO TABS
20.0000 mg | ORAL_TABLET | Freq: Every day | ORAL | Status: DC
  Administered 2022-09-04 – 2022-09-07 (×4): 20 mg via ORAL
  Filled 2022-09-04 (×5): qty 2

## 2022-09-04 MED ORDER — LACTATED RINGERS IV SOLN: INTRAVENOUS | Status: AC

## 2022-09-04 MED ORDER — CARVEDILOL 12.5 MG PO TABS
12.5000 mg | ORAL_TABLET | Freq: Two times a day (BID) | ORAL | Status: DC
  Administered 2022-09-04 – 2022-09-07 (×7): 12.5 mg via ORAL
  Filled 2022-09-04 (×7): qty 1

## 2022-09-04 MED ORDER — DAROLUTAMIDE 300 MG PO TABS
300.0000 mg | ORAL_TABLET | Freq: Two times a day (BID) | ORAL | Status: DC
  Administered 2022-09-04 – 2022-09-07 (×6): 300 mg via ORAL
  Filled 2022-09-04 (×6): qty 1

## 2022-09-04 MED ORDER — HYDRALAZINE HCL 50 MG PO TABS
75.0000 mg | ORAL_TABLET | Freq: Three times a day (TID) | ORAL | Status: DC
  Administered 2022-09-04 – 2022-09-07 (×10): 75 mg via ORAL
  Filled 2022-09-04 (×11): qty 1

## 2022-09-04 MED ORDER — DAROLUTAMIDE 300 MG PO TABS: 300.0000 mg | ORAL_TABLET | Freq: Two times a day (BID) | ORAL | Status: DC

## 2022-09-04 MED ORDER — ONDANSETRON HCL 4 MG/2ML IJ SOLN: 4.0000 mg | Freq: Four times a day (QID) | INTRAMUSCULAR | Status: DC | PRN

## 2022-09-04 MED ORDER — ACETAMINOPHEN 650 MG RE SUPP: 650.0000 mg | Freq: Four times a day (QID) | RECTAL | Status: DC | PRN

## 2022-09-04 MED ORDER — SODIUM BICARBONATE 650 MG PO TABS
650.0000 mg | ORAL_TABLET | Freq: Every day | ORAL | Status: DC
  Administered 2022-09-04 – 2022-09-07 (×4): 650 mg via ORAL
  Filled 2022-09-04 (×5): qty 1

## 2022-09-04 MED ORDER — TIMOLOL MALEATE 0.5 % OP SOLN
1.0000 [drp] | Freq: Two times a day (BID) | OPHTHALMIC | Status: DC
  Administered 2022-09-04 – 2022-09-07 (×7): 1 [drp] via OPHTHALMIC
  Filled 2022-09-04: qty 5

## 2022-09-04 MED ORDER — ONDANSETRON HCL 4 MG PO TABS: 4.0000 mg | ORAL_TABLET | Freq: Four times a day (QID) | ORAL | Status: DC | PRN

## 2022-09-04 MED ORDER — APOAEQUORIN 10 MG PO CAPS: 10.0000 mg | ORAL_CAPSULE | Freq: Every day | ORAL | Status: DC

## 2022-09-04 NOTE — Assessment & Plan Note (Addendum)
HGB worse at 8.8 today (previously in the 9 range), likely due to AKI today. Repeat CBC in AM

## 2022-09-04 NOTE — Assessment & Plan Note (Addendum)
Not entirely clear as to the cause of this. While echo, BNP elevations, etc were worrisome for severe cardiac disease, his recent R and L heart cath in [facility] July didn't really demonstrate severe cardiac disease it seems.  Specifically it showed mild abnormalities as listed in results section above. Check COVID-19 since there seems to be a local outbreak of this over the past week. I wonder if his chronic RTA might be contributing to his feeling of shortness of breath, I see that he seems to have persistently low bicarbs this past month with his kidney disease.  Question if he doesn't need to be on PO bicarbonate? ABG pending Call nephrology in AM Ill start low dose PO bicarb in the mean time pending nephrologist evaluation.

## 2022-09-04 NOTE — Assessment & Plan Note (Signed)
Cont home amlodipine, coreg, hydralazine. Holding ACEi in setting of AKI.

## 2022-09-04 NOTE — Progress Notes (Addendum)
Mobility Specialist Progress Note:   09/04/22 1025  Mobility  Activity Ambulated with assistance in hallway  Level of Assistance Contact guard assist, steadying assist  Assistive Device Cane  Distance Ambulated (ft) 100 ft  Activity Response Tolerated well  Mobility Referral Yes  $Mobility charge 1 Mobility  Mobility Specialist Start Time (ACUTE ONLY) 1055  Mobility Specialist Stop Time (ACUTE ONLY) 1125  Mobility Specialist Time Calculation (min) (ACUTE ONLY) 30 min    Pre Mobility: 91 HR , 141/93 BP , 95% SpO2 During Mobility: 101-113 HR , 93% SpO2  Post Mobility: 85 HR , 96% SpO2  Pt received in BR, agreeable to mobility. 2x standing rest break required d/t SOB. Pursed lip breathing encouraged. Pt left in bed with call bell in hand and all needs met.    Leory Plowman  Mobility Specialist Please contact via [employer] office at [phone]

## 2022-09-04 NOTE — TOC Initial Note (Signed)
Transition of Care [facility]) - Initial/Assessment Note    Patient Details  Name: Derek Blackburn MRN: [id number] Date of Birth: [birth year]  Transition of Care [facility]) CM/SW Contact:    Harriet Masson, RN Phone Number: 09/04/2022, 2:46 PM  Clinical Narrative:                 Spoke to patient regarding transition needs.  Patient lives with wife who drives him to apts. Patient has a cane and goes to the gym.  PCP confirmed.  TOC following.   Expected Discharge Plan: Home/Self Care Barriers to Discharge: Continued Medical Work up   Patient Goals and CMS Choice Patient states their goals for this hospitalization and ongoing recovery are:: return home          Expected Discharge Plan and Services       Living arrangements for the past 2 months: Single Family Home                                      Prior Living Arrangements/Services Living arrangements for the past 2 months: Single Family Home Lives with:: Spouse Patient language and need for interpreter reviewed:: Yes Do you feel safe going back to the place where you live?: Yes      Need for Family Participation in Patient Care: Yes (Comment) Care giver support system in place?: Yes (comment) Current home services: DME (cane) Criminal Activity/Legal Involvement Pertinent to Current Situation/Hospitalization: No - Comment as needed  Activities of Daily Living Home Assistive Devices/Equipment: Cane (specify quad or straight) (STRAIGHT) ADL Screening (condition at time of admission) Patient's cognitive ability adequate to safely complete daily activities?: Yes Is the patient deaf or have difficulty hearing?: No Does the patient have difficulty seeing, even when wearing glasses/contacts?: No Does the patient have difficulty concentrating, remembering, or making decisions?: No Patient able to express need for assistance with ADLs?: Yes Does the patient have difficulty dressing or bathing?: No Independently  performs ADLs?: Yes (appropriate for developmental age) Does the patient have difficulty walking or climbing stairs?: Yes Weakness of Legs: Both Weakness of Arms/Hands: None  Permission Sought/Granted                  Emotional Assessment Appearance:: Appears stated age Attitude/Demeanor/Rapport: Gracious Affect (typically observed): Accepting Orientation: : Oriented to Self, Oriented to  Time, Oriented to Place, Oriented to Situation Alcohol / Substance Use: Not Applicable Psych Involvement: No (comment)  Admission diagnosis:  SOB (shortness of breath) [R06.02] Elevated brain natriuretic peptide (BNP) level [R79.89] AKI (acute kidney injury) (HCC) [N17.9] Elevated d-dimer [R79.89] Patient Active Problem List   Diagnosis Date Noted   AKI (acute kidney injury) (HCC) 09/04/2022   Dyspnea on exertion 08/15/2022   Hypertensive urgency 08/13/2022   Rheumatoid arthritis involving multiple sites with positive rheumatoid factor (HCC) 08/13/2022   Atherosclerosis of native arteries of extremities with intermittent claudication, bilateral legs (HCC) 08/06/2022   Peripheral vascular disease (HCC) 08/06/2022   Chest pain, rule out acute myocardial infarction 08/06/2022   Elevated brain natriuretic peptide (BNP) level 08/06/2022   Acute renal failure superimposed on stage 3b chronic kidney disease (HCC) 08/06/2022   Memory impairment 06/19/2022   Neck pain 06/19/2022   Anemia 11/07/2021   Hyperlipidemia 11/07/2021   History of anemia due to CKD 09/25/2021   Chronic kidney disease, stage 3 unspecified (HCC) 07/11/2020   Essential hypertension 07/11/2020   Malignant  neoplasm of prostate (HCC) 02/08/2020   PCP:  Diamantina Providence, FNP Pharmacy:   [facility] [phone] - [place], [place] - [address] AT [facility] [address] [place] [address] Phone: [phone] Fax: [phone]     Social Determinants of Health (SDOH) Social  History: SDOH Screenings   Food Insecurity: No Food Insecurity (09/04/2022)  Housing: Low Risk  (09/04/2022)  Transportation Needs: No Transportation Needs (09/04/2022)  Utilities: Not At Risk (09/04/2022)  Tobacco Use: Low Risk  (09/03/2022)   SDOH Interventions:     Readmission Risk Interventions     No data to display

## 2022-09-04 NOTE — Progress Notes (Signed)
  PROGRESS NOTE  Patient admitted earlier this morning. See H&P.   Derek Blackburn is an 87 year old male with past medical history significant for CKD, hypertension.  He had a hospitalization in 7/9 for dyspnea on exertion, had AKI which improved with hydration.  He was readmitted 7/16 for shortness of breath and hypertensive urgency.  At that time, patient underwent extensive cardiac workup including echocardiogram, heart cath, was largely unremarkable for severe cardiac disease.  He now returns for continued dyspnea on exertion.  Patient seen walking in the hallway earlier this morning.  Patient states that at rest, patient feels "great".  His dyspnea occurs with exertion.  He remains stable on room air at rest, without conversational dyspnea or respiratory distress, or tachypnea.  AKI on CKD stage IIIb -Baseline creatinine 1.8 -Renal ultrasound showed large bilateral simple renal cysts as well as heterogenous structure within the urinary bladder -IV fluid -Patient followed by Dr. Thedore Mins at [facility].  He is not covering [employer] today, as previously noted in H&P -AKI improving   Dyspnea on exertion -Had recent cardiac workup which was unremarkable -COVID negative -Mild focal areas of bronchiectasis seen in the right lower lobe and posterior right upper lobe with associated mild interstitial thickening and architectural distortion, likely sequela of prior infection or recurrent aspiration. -SLP eval for aspiration concern   Non-anion gap metabolic acidosis -Sodium bicarb tablet -Follow up with Nephrology   Anemia of chronic disease -Hemoglobin stable  Prostate cancer -Followed by Dr. Mena Goes -Discussed with Dr. Mena Goes, he is okay for patient to hold Nubeqa in the inpatient setting.  He can resume 300mg  BID after discharge  Hypertension -Amlodipine, Coreg, hydralazine -Holding ACE inhibitor in setting of AKI  Incidental findings: -Mildly dilated  ascending thoracic aorta measuring up to 4.1 cm. Recommend annual imaging followup by CTA or MRA.  -Enlarged and heterogeneous thyroid. Recommend non-emergent thyroid ultrasound  Status is: Observation The patient will require care spanning > 2 midnights and should be moved to inpatient because: IVF   Noralee Stain, DO [facility] 09/04/2022, 12:48 PM  Available via Epic secure chat 7am-7pm After these hours, please refer to coverage provider listed on [email or web address]

## 2022-09-04 NOTE — ED Notes (Signed)
ED TO INPATIENT HANDOFF REPORT  ED Nurse Name and Phone #: [phone]    S Name/Age/Gender Demarquez M Vandall 87 y.o. male Room/Bed: 018C/018C  Code Status   Code Status: Full Code  Home/SNF/Other Home Patient oriented to: self, place, time, and situation Is this baseline? Yes   Triage Complete: Triage complete  Chief Complaint AKI (acute kidney injury) (HCC) [N17.9]  Triage Note Pt with increasing shob recently. Has been seen for same.  Was to followup with a pulmonologist but they do not have any appointments available.  Pt felt he could not wait and has had increasing work of breathing.  Pt tachypneic at time of triage.    Allergies No Known Allergies  Level of Care/Admitting Diagnosis ED Disposition     ED Disposition  Admit   Condition  --   Comment  Hospital Area: [facility] [[id number]]  Level of Care: Telemetry Cardiac [[id number]]  May place patient in observation at [facility] or [facility] if equivalent level of care is available:: No  Covid Evaluation: Confirmed COVID Negative  Diagnosis: AKI (acute kidney injury) [facility]) [[id number]]  Admitting Physician: Hillary Bow [phone]  Attending Physician: Hillary Bow [[id number]]          B Medical/Surgery History Past Medical History:  Diagnosis Date   Anemia    CKD (chronic kidney disease)    Heart murmur    Hypertension    Hypertensive urgency    Peripheral polyneuropathy    Prostate cancer (HCC)    PVD (peripheral vascular disease) (HCC)    Rheumatoid arthritis involving multiple sites with positive rheumatoid factor (HCC) 08/13/2022   Past Surgical History:  Procedure Laterality Date   BACK SURGERY     PROSTATE BIOPSY     RIGHT/LEFT HEART CATH AND CORONARY ANGIOGRAPHY N/A 08/07/2022   Procedure: RIGHT/LEFT HEART CATH AND CORONARY ANGIOGRAPHY;  Surgeon: Swaziland, Peter M, MD;  Location: [facility] INVASIVE CV LAB;  Service: Cardiovascular;  Laterality: N/A;     A IV  Location/Drains/Wounds Patient Lines/Drains/Airways Status     Active Line/Drains/Airways     Name Placement date Placement time Site Days   Peripheral IV 09/03/22 20 G Anterior;Right Forearm 09/03/22  1955  Forearm  1            Intake/Output Last 24 hours No intake or output data in the 24 hours ending 09/04/22 0045  Labs/Imaging Results for orders placed or performed during the hospital encounter of 09/03/22 (from the past 48 hour(s))  CBC     Status: Abnormal   Collection Time: 09/03/22  3:19 PM  Result Value Ref Range   WBC 8.2 4.0 - 10.5 K/uL   RBC 3.27 (L) 4.22 - 5.81 MIL/uL   Hemoglobin 9.6 (L) 13.0 - 17.0 g/dL   HCT 03.4 (L) 74.2 - 59.5 %   MCV 97.9 80.0 - 100.0 fL   MCH 29.4 26.0 - 34.0 pg   MCHC 30.0 30.0 - 36.0 g/dL   RDW 63.8 75.6 - 43.3 %   Platelets 252 150 - 400 K/uL   nRBC 0.0 0.0 - 0.2 %    Comment: Performed at [facility], [address], [place], [place] [address]  Basic metabolic panel     Status: Abnormal   Collection Time: 09/03/22  3:19 PM  Result Value Ref Range   Sodium 143 135 - 145 mmol/L   Potassium 4.3 3.5 - 5.1 mmol/L   Chloride 107 98 - 111 mmol/L  CO2 20 (L) 22 - 32 mmol/L   Glucose, Bld 136 (H) 70 - 99 mg/dL    Comment: Glucose reference range applies only to samples taken after fasting for at least 8 hours.   BUN 50 (H) 8 - 23 mg/dL   Creatinine, Ser 6.04 (H) 0.61 - 1.24 mg/dL   Calcium 9.6 8.9 - 54.0 mg/dL   GFR, Estimated 19 (L) >60 mL/min    Comment: (NOTE) Calculated using the CKD-EPI Creatinine Equation (2021)    Anion gap 16 (H) 5 - 15    Comment: Performed at [facility], [address] [place], [place] [address]  Troponin I (High Sensitivity)     Status: Abnormal   Collection Time: 09/03/22  3:19 PM  Result Value Ref Range   Troponin I (High Sensitivity) 36 (H) <18 ng/L    Comment: (NOTE) Elevated high sensitivity troponin I (hsTnI) values and significant  changes across serial measurements may  suggest ACS but many other  chronic and acute conditions are known to elevate hsTnI results.  Refer to the "Links" section for chest pain algorithms and additional  guidance. Performed at [facility], [address], [place], [place] [address]   Brain natriuretic peptide     Status: Abnormal   Collection Time: 09/03/22  3:19 PM  Result Value Ref Range   B Natriuretic Peptide 1,767.1 (H) 0.0 - 100.0 pg/mL    Comment: Performed at [facility], [address], [place], [place] [address]  Troponin I (High Sensitivity)     Status: Abnormal   Collection Time: 09/03/22  7:51 PM  Result Value Ref Range   Troponin I (High Sensitivity) 41 (H) <18 ng/L    Comment: (NOTE) Elevated high sensitivity troponin I (hsTnI) values and significant  changes across serial measurements may suggest ACS but many other  chronic and acute conditions are known to elevate hsTnI results.  Refer to the "Links" section for chest pain algorithms and additional  guidance. Performed at [facility], [address], [place], [place] [address]   D-dimer, quantitative     Status: Abnormal   Collection Time: 09/03/22  9:23 PM  Result Value Ref Range   D-Dimer, Quant 2.84 (H) 0.00 - 0.50 ug/mL-FEU    Comment: (NOTE) At the manufacturer cut-off value of 0.5 g/mL FEU, this assay has a negative predictive value of 95-100%.This assay is intended for use in conjunction with a clinical pretest probability (PTP) assessment model to exclude pulmonary embolism (PE) and deep venous thrombosis (DVT) in outpatients suspected of PE or DVT. Results should be correlated with clinical presentation. Performed at [facility], [address], [place], [place] [address]   I-Stat arterial blood gas, ED [facility], [facility], [facility])     Status: Abnormal   Collection Time: 09/03/22 11:10 PM  Result Value Ref Range   pH, Arterial 7.469 (H) 7.35 - 7.45   pCO2 arterial 26.9 (L) 32 - 48 mmHg   pO2, Arterial 55 (L) 83 - 108  mmHg   Bicarbonate 19.6 (L) 20.0 - 28.0 mmol/L   TCO2 20 (L) 22 - 32 mmol/L   O2 Saturation 91 %   Acid-base deficit 3.0 (H) 0.0 - 2.0 mmol/L   Sodium 145 135 - 145 mmol/L   Potassium 4.3 3.5 - 5.1 mmol/L   Calcium, Ion 1.04 (L) 1.15 - 1.40 mmol/L   HCT 26.0 (L) 39.0 - 52.0 %   Hemoglobin 8.8 (L) 13.0 -  17.0 g/dL   Patient temperature 82.9 F    Collection site RADIAL, ALLEN'S TEST ACCEPTABLE    Drawn by Operator    Sample type ARTERIAL   Lactic acid, plasma     Status: None   Collection Time: 09/03/22 11:24 PM  Result Value Ref Range   Lactic Acid, Venous 1.3 0.5 - 1.9 mmol/L    Comment: Performed at [facility], [address], [place], [place] [address]   CT Chest Wo Contrast  Result Date: 09/03/2022 CLINICAL DATA:  Shortness of breath EXAM: CT CHEST WITHOUT CONTRAST TECHNIQUE: Multidetector CT imaging of the chest was performed following the standard protocol without IV contrast. RADIATION DOSE REDUCTION: This exam was performed according to the departmental dose-optimization program which includes automated exposure control, adjustment of the mA and/or kV according to patient size and/or use of iterative reconstruction technique. COMPARISON:  None Available. FINDINGS: Cardiovascular: Cardiomegaly. No pericardial effusion. Normal caliber thoracic aorta moderate calcified plaque. Mildly dilated ascending thoracic aorta measuring up to 4.1 cm. Dilated main pulmonary artery, measuring up to 4.2 cm. Severe aortic valve calcifications. Mild coronary artery calcifications. Mediastinum/Nodes: Esophagus unremarkable. Enlarged and heterogeneous thyroid. Calcified mediastinal and hilar lymph nodes, likely sequela of prior granulomatous infection. No enlarged lymph nodes seen in the chest. Lungs/Pleura: Central airways are patent. Mild focal areas of bronchiectasis seen in the right lower lobe posterior right upper lobe with associated mild interstitial thickening and architectural distortion,  likely sequela of prior infection or recurrent aspiration. No consolidation, pleural effusion or pneumothorax. Upper Abdomen: Bilateral nonobstructing renal stones. Multiple cystic lesions of the left kidney, likely simple cysts, no specific follow-up imaging is necessary. Musculoskeletal: No chest wall mass or suspicious bone lesions identified. IMPRESSION: 1. Mild focal areas of bronchiectasis seen in the right lower lobe and posterior right upper lobe with associated mild interstitial thickening and architectural distortion, likely sequela of prior infection or recurrent aspiration. 2. Dilated main pulmonary artery, which can be seen in the setting of pulmonary hypertension. 3. Severe aortic valve calcifications, findings can be seen in the setting of aortic stenosis. Correlate with echocardiography. 4. Mildly dilated ascending thoracic aorta measuring up to 4.1 cm. Recommend annual imaging followup by CTA or MRA. This recommendation follows 2010 ACCF/AHA/AATS/ACR/ASA/SCA/SCAI/SIR/STS/SVM Guidelines for the Diagnosis and Management of Patients with Thoracic Aortic Disease. Circulation. 2010; 121: Y865-H846. Aortic aneurysm NOS (ICD10-I71.9) 5. Enlarged and heterogeneous thyroid. Recommend non-emergent thyroid ultrasound if clinically warranted given patient age. 6. Cardiomegaly and aortic Atherosclerosis (ICD10-I70.0). Electronically Signed   By: Allegra Lai M.D.   On: 09/03/2022 18:49   DG Chest 2 View  Result Date: 09/03/2022 CLINICAL DATA:  Shortness of breath for 6 months.  Back pain. EXAM: CHEST - 2 VIEW COMPARISON:  Radiographs 08/15/2022, 08/13/2022 and 07/20/2021. FINDINGS: The heart size and mediastinal contours are stable with cardiomegaly and aortic tortuosity. The trachea remains midline. The lungs are clear. There is no pleural effusion or pneumothorax. Stable mild degenerative changes throughout the spine without evidence of acute osseous abnormality. Telemetry leads overlie the chest.  IMPRESSION: Stable cardiomegaly. No evidence of acute cardiopulmonary process. Electronically Signed   By: Carey Bullocks M.D.   On: 09/03/2022 16:28    Pending Labs Unresulted Labs (From admission, onward)     Start     Ordered   09/04/22 0500  CBC  Tomorrow morning,   R        09/04/22 0036   09/04/22 0500  Basic metabolic panel  Tomorrow morning,  R        09/04/22 0036            Vitals/Pain Today's Vitals   09/03/22 2200 09/03/22 2330 09/03/22 2331 09/03/22 2332  BP:  (!) 162/98    Pulse:  91    Resp:  (!) 28    Temp:    98.9 F (37.2 C)  TempSrc:    Oral  SpO2: 97% 94%    PainSc:   0-No pain     Isolation Precautions No active isolations  Medications Medications  amLODipine (NORVASC) tablet 10 mg (has no administration in time range)  carvedilol (COREG) tablet 12.5 mg (has no administration in time range)  donepezil (ARICEPT) tablet 10 mg (has no administration in time range)  hydrALAZINE (APRESOLINE) tablet 75 mg (has no administration in time range)  leflunomide (ARAVA) tablet 20 mg (has no administration in time range)  latanoprost (XALATAN) 0.005 % ophthalmic solution 1 drop (has no administration in time range)  timolol (TIMOPTIC) 0.5 % ophthalmic solution 1 drop (has no administration in time range)  atorvastatin (LIPITOR) tablet 10 mg (has no administration in time range)  loratadine (CLARITIN) tablet 10 mg (has no administration in time range)  enoxaparin (LOVENOX) injection 30 mg (has no administration in time range)  acetaminophen (TYLENOL) tablet 650 mg (has no administration in time range)    Or  acetaminophen (TYLENOL) suppository 650 mg (has no administration in time range)  ondansetron (ZOFRAN) tablet 4 mg (has no administration in time range)    Or  ondansetron (ZOFRAN) injection 4 mg (has no administration in time range)  lactated ringers infusion (has no administration in time range)  darolutamide (NUBEQA) tablet 300 mg (has no  administration in time range)  acetaminophen (TYLENOL) tablet 650 mg (650 mg Oral Given 09/03/22 1955)    Mobility walks with device     Focused Assessments    R Recommendations: See Admitting Provider Note  Report given to:   Additional Notes: pt a/o x4, walks with cane, and continent x2- LBM 09/04/22

## 2022-09-04 NOTE — Assessment & Plan Note (Addendum)
AKI on CKD 3 Will try gentle hydration with IVF since that's what made AKI better last month (7/9) when admitted with similar presentation, complaint, etc. Strict intake and output Repeat BMP in AM Looks like he's got an RTA on BMPs over the past month. Call nephrology in AM Follows with Dr. Thedore Mins at [facility] it sounds like (who very conveniently happens to be the nephrologist on call during day tomorrow it looks like). Hold ACEi and Jardiance in setting of AKI. Will check renal US

## 2022-09-04 NOTE — Plan of Care (Signed)

## 2022-09-04 NOTE — Progress Notes (Addendum)
Prior-To-Admission Oral Chemotherapy for Treatment of Oncologic Disease   Order noted from Dr. Julian Reil to continue prior-to-admission oral chemotherapy regimen of darolutamide.  Procedure Per Pharmacy & Therapeutics Committee Policy: Orders for continuation of home oral chemotherapy for treatment of an oncologic disease will be held unless approved by an oncologist during current admission.    For patients receiving oncology care at [facility], inpatient pharmacist contacts patient's oncologist during regular office hours to review. If earlier review is medically necessary, attending physician consults [facility] on-call oncologist   For patients receiving oncology care outside of [facility], attending physician consults patient's oncologist to review. If this oncologist or their coverage cannot be reached, attending physician consults [facility] on-call oncologist   Oral chemotherapy continuation order is on hold pending oncologist review, Non-[facility] oncologist Dr. Jerilee Field from [facility] provides oncology care and should be consulted by attending physician  -Office # [phone]  Harland German, PharmD Clinical Pharmacist **Pharmacist phone directory can now be found on [email or web address] (PW [email or web address]).  Listed under [facility].  Addendum -Dr. Mena Goes approved use of darolutamide. -plans are to reduce dose to 300mg  bid  Harland German, PharmD Clinical Pharmacist **Pharmacist phone directory can now be found on [email or web address] (PW [email or web address]).  Listed under [facility].

## 2022-09-04 NOTE — Progress Notes (Signed)
PHARMACIST - PHYSICIAN ORDER COMMUNICATION  CONCERNING: P&T Medication Policy on Herbal Medications  DESCRIPTION:  This patient's order for:  Prevagen has been noted.  This product(s) is classified as an "herbal" or natural product. Due to a lack of definitive safety studies or FDA approval, nonstandard manufacturing practices, plus the potential risk of unknown drug-drug interactions while on inpatient medications, the Pharmacy and Therapeutics Committee does not permit the use of "herbal" or natural products of this type within [place].   ACTION TAKEN: The pharmacy department is unable to verify this order at this time and your patient has been informed of this safety policy. Please reevaluate patient's clinical condition at discharge and address if the herbal or natural product(s) should be resumed at that time.   

## 2022-09-04 NOTE — Progress Notes (Signed)
TRH night cross cover note:   I was notified by RN of the patient's request for a sleep aid. I subsequently placed order for prn melatonin for insomnia.      , DO Hospitalist  

## 2022-09-04 NOTE — Evaluation (Signed)
Clinical/Bedside Swallow Evaluation Patient Details  Name: Derek Blackburn MRN: [id number] Date of Birth: 1936-01-15  Today's Date: 09/04/2022 Time: SLP Start Time (ACUTE ONLY): 1620 SLP Stop Time (ACUTE ONLY): 1635 SLP Time Calculation (min) (ACUTE ONLY): 15 min  Past Medical History:  Past Medical History:  Diagnosis Date   Anemia    CKD (chronic kidney disease)    Heart murmur    Hypertension    Hypertensive urgency    Peripheral polyneuropathy    Prostate cancer (HCC)    PVD (peripheral vascular disease) (HCC)    Rheumatoid arthritis involving multiple sites with positive rheumatoid factor (HCC) 08/13/2022   Past Surgical History:  Past Surgical History:  Procedure Laterality Date   BACK SURGERY     PROSTATE BIOPSY     RIGHT/LEFT HEART CATH AND CORONARY ANGIOGRAPHY N/A 08/07/2022   Procedure: RIGHT/LEFT HEART CATH AND CORONARY ANGIOGRAPHY;  Surgeon: Swaziland, Peter M, MD;  Location: [facility];  Service: Cardiovascular;  Laterality: N/A;   HPI:  Pt is an 87 yo male presenting to ED 8/7 with worsening SOB, primarily with exertion. Recently admitted 7/9 for DOE and AKI and 7/16 for SOB, trop elevation, HTN urgency. At that point, extensive cardiac w/u performed, ultimately with results that "didn't sound very impressive for severe cardiac disease". CXR with mild focal areas of bronchiectasis seen in the right lower lobe and posterior right upper lobe with associated mild interstitial  thickening and architectural distortion, likely sequela of prior infection or recurrent aspiration, which is the reason for SLP consult. PMH includes anemia, CKD, HTN    Assessment / Plan / Recommendation  Clinical Impression  Pt reports no concerns with swallowing. Oral motor exam WFL. Pt observed during trials of thin liquids, purees, and solids from his meal tray with no overt s/s of dysphagia or aspiration. SpO2 remained steadily ~95% throughout conversation and coordinating swallowing,  even when cued to take challenging, consecutive sips via straw. Pt provided education regarding swallow precautions (frequent breaks, small bites/sips, slow rate) in the case that he experiences increased SOB during meal times. No further SLP f/u is necessary at this time. Will s/o. SLP Visit Diagnosis: Dysphagia, unspecified (R13.10)    Aspiration Risk  No limitations    Diet Recommendation Regular;Thin liquid    Liquid Administration via: Cup;Straw Medication Administration: Whole meds with liquid Supervision: Patient able to self feed Compensations: Slow rate;Small sips/bites Postural Changes: Seated upright at 90 degrees;Remain upright for at least 30 minutes after po intake    Other  Recommendations Oral Care Recommendations: Oral care BID    Recommendations for follow up therapy are one component of a multi-disciplinary discharge planning process, led by the attending physician.  Recommendations may be updated based on patient status, additional functional criteria and insurance authorization.  Follow up Recommendations No SLP follow up      Assistance Recommended at Discharge    Functional Status Assessment Patient has not had a recent decline in their functional status  Frequency and Duration            Prognosis Prognosis for improved oropharyngeal function: Good      Swallow Study   General HPI: Pt is an 87 yo male presenting to ED 8/7 with worsening SOB, primarily with exertion. Recently admitted 7/9 for DOE and AKI and 7/16 for SOB, trop elevation, HTN urgency. At that point, extensive cardiac w/u performed, ultimately with results that "didn't sound very impressive for severe cardiac disease". CXR with mild focal  areas of bronchiectasis seen in the right lower lobe and posterior right upper lobe with associated mild interstitial  thickening and architectural distortion, likely sequela of prior infection or recurrent aspiration, which is the reason for SLP consult. PMH  includes anemia, CKD, HTN Type of Study: Bedside Swallow Evaluation Previous Swallow Assessment: none in chart Diet Prior to this Study: Regular;Thin liquids (Level 0) Temperature Spikes Noted: No Respiratory Status: Room air History of Recent Intubation: No Behavior/Cognition: Alert;Cooperative;Pleasant mood Oral Cavity Assessment: Within Functional Limits Oral Care Completed by SLP: No Oral Cavity - Dentition: Adequate natural dentition Vision: Functional for self-feeding Self-Feeding Abilities: Able to feed self Patient Positioning: Upright in bed Baseline Vocal Quality: Normal Volitional Cough: Strong Volitional Swallow: Able to elicit    Oral/Motor/Sensory Function Overall Oral Motor/Sensory Function: Within functional limits   Ice Chips Ice chips: Not tested   Thin Liquid Thin Liquid: Within functional limits Presentation: Straw    Nectar Thick Nectar Thick Liquid: Not tested   Honey Thick Honey Thick Liquid: Not tested   Puree Puree: Within functional limits Presentation: Spoon   Solid     Solid: Within functional limits Presentation: Self Fed      Gwynneth Aliment, M.A., CF-SLP Speech Language Pathology, Acute Rehabilitation Services  Secure Chat preferred [phone]  09/04/2022,4:45 PM

## 2022-09-04 NOTE — Assessment & Plan Note (Signed)
Reduce Darolutamide dose to 300 BID for the moment due to renal fxn, at least until AKI improves / resolved.

## 2022-09-04 NOTE — H&P (Signed)
History and Physical    Patient: Derek Blackburn NGE:[id number] DOB: 22-Apr-1935 DOA: 09/03/2022 DOS: the patient was seen and examined on 09/04/2022 PCP: Diamantina Providence, FNP  Patient coming from: Home  Chief Complaint:  Chief Complaint  Patient presents with   Shortness of Breath   HPI: Derek Blackburn is a 87 y.o. male with medical history significant of anemia, CKD, HTN.  Pt with DOE especially over the past month.  Admitted to hospital 7/9 for DOE and AKI.  AKI improved with gentle hydration.  Readmitted on 7/16 for SOB, trop elevation, HTN urgency.  Extensive cardiac work up performed including 2d echo followed by LHC + RHC during that admit.  Ultimately though results didn't sound very impressive for severe cardiac disease:  Minimal nonobstructive CAD Mild aortic stenosis. Mean AV gradient 11 mm Hg. AVA 2.05 cm squared with index 1.1 Normal LV filling pressures. LVEDP 7 mm Hg. PCWP 8/7 with mean 6 mm Hg Mild pulmonary HTN. PAP 60/14 with mean 32 mm Hg Normal RA pressure mean 9 mm Hg Normal cardiac output 5.73 L/min, index 3.08.  Today in to ED with worsening SOB.  This occurs primarily with exertion he notes.  Doesn't really have orthopnea and he can lay flat without too much problem he says.  States BPs have been well controlled at home.  No fever, cough, back pain, abd pain.    Review of Systems: As mentioned in the history of present illness. All other systems reviewed and are negative. Past Medical History:  Diagnosis Date   Anemia    CKD (chronic kidney disease)    Heart murmur    Hypertension    Hypertensive urgency    Peripheral polyneuropathy    Prostate cancer (HCC)    PVD (peripheral vascular disease) (HCC)    Rheumatoid arthritis involving multiple sites with positive rheumatoid factor (HCC) 08/13/2022   Past Surgical History:  Procedure Laterality Date   BACK SURGERY     PROSTATE BIOPSY     RIGHT/LEFT HEART CATH AND CORONARY ANGIOGRAPHY N/A  08/07/2022   Procedure: RIGHT/LEFT HEART CATH AND CORONARY ANGIOGRAPHY;  Surgeon: [place], Peter M, MD;  Location: [facility];  Service: Cardiovascular;  Laterality: N/A;   Social History:  reports that he has never smoked. He has never used smokeless tobacco. He reports current alcohol use. He reports that he does not use drugs.  No Known Allergies  Family History  Problem Relation Age of Onset   Breast cancer Neg Hx    Colon cancer Neg Hx    Prostate cancer Neg Hx    Pancreatic cancer Neg Hx     Prior to Admission medications   Medication Sig Start Date End Date Taking? Authorizing Provider  acetaminophen (TYLENOL) 650 MG CR tablet Take 650 mg by mouth at bedtime.   Yes [provider]  amLODipine (NORVASC) 10 MG tablet Take 1 tablet (10 mg total) by mouth daily. 06/03/22  Yes Kathleene Hazel, MD  Apoaequorin (PREVAGEN) 10 MG CAPS Take 10 mg by mouth daily.   Yes [provider]  atorvastatin (LIPITOR) 10 MG tablet Take 1 tablet (10 mg total) by mouth daily. 08/09/22  Yes Burnadette Pop, MD  Calcium Carbonate (CALCIUM 500 PO) Take 500 mg by mouth daily.   Yes [provider]  carvedilol (COREG) 12.5 MG tablet Take 1 tablet (12.5 mg total) by mouth 2 (two) times daily with a meal. 08/15/22  Yes Narda Bonds, MD  darolutamide (  NUBEQA) 300 MG tablet Take 600 mg by mouth 2 (two) times daily with a meal. This medication treats Prostate Cancer.   Yes [provider]  donepezil (ARICEPT) 10 MG tablet Take 1 tablet (10 mg total) by mouth daily. 06/19/22  Yes Marcos Eke, PA-C  hydrALAZINE (APRESOLINE) 25 MG tablet Take 3 tablets (75 mg total) by mouth 3 (three) times daily. 08/08/22  Yes Adhikari, Willia Craze, MD  JARDIANCE 10 MG TABS tablet Take 10 mg by mouth daily. 04/11/22  Yes [provider]  latanoprost (XALATAN) 0.005 % ophthalmic solution Place 1 drop into both eyes at bedtime. 11/18/19  Yes [provider]  leflunomide  (ARAVA) 20 MG tablet Take 20 mg by mouth daily. 07/02/22  Yes [provider]  loratadine (CLARITIN) 10 MG tablet Take 10 mg by mouth daily.   Yes [provider]  Multiple Vitamin (MULITIVITAMIN WITH MINERALS) TABS Take 1 tablet by mouth daily.   Yes [provider]  Omega-3 Fatty Acids (FISH OIL) 1200 MG CAPS Take 1,200 mg by mouth daily.   Yes [provider]  Potassium 99 MG TABS Take 99 mg by mouth daily.   Yes [provider]  timolol (TIMOPTIC) 0.5 % ophthalmic solution Place 1 drop into both eyes 2 (two) times daily. 03/23/22  Yes [provider]  trandolapril (MAVIK) 4 MG tablet Take 4 mg by mouth daily.   Yes [provider]  vitamin B-12 (CYANOCOBALAMIN) 100 MCG tablet Take 100 mcg by mouth daily.   Yes [provider]  VITAMIN D, CHOLECALCIFEROL, PO Take 1 tablet by mouth daily.   Yes [provider]    Physical Exam: Vitals:   09/03/22 2015 09/03/22 2200 09/03/22 2330 09/03/22 2332  BP: (!) 156/95  (!) 162/98   Pulse: (!) 103  91   Resp: (!) 33  (!) 28   Temp:    98.9 F (37.2 C)  TempSrc:    Oral  SpO2: 92% 97% 94%    Constitutional: NAD, calm, comfortable Respiratory: clear to auscultation bilaterally, no wheezing, no crackles. However, pt is tachypnic with RR in low 30s. Cardiovascular: RRR, Murmur present. No extremity edema. 2+ pedal pulses. No carotid bruits.  Abdomen: no tenderness, no masses palpated. No hepatosplenomegaly. Bowel sounds positive.  Neurologic: CN 2-12 grossly intact. Sensation intact, DTR normal. Strength 5/5 in all 4.  Psychiatric: Normal judgment and insight. Alert and oriented x 3. Normal mood.   Data Reviewed:    Labs on Admission: I have personally reviewed following labs and imaging studies  CBC: Recent Labs  Lab 08/28/22 1114 09/03/22 1519 09/03/22 2310  WBC  --  8.2  --   HGB 9.9* 9.6* 8.8*  HCT  --  32.0* 26.0*  MCV  --  97.9  --   PLT  --  252  --     Basic Metabolic Panel: Recent Labs  Lab 09/03/22 1519 09/03/22 2310  NA 143 145  K 4.3 4.3  CL 107  --   CO2 20*  --   GLUCOSE 136*  --   BUN 50*  --   CREATININE 3.06*  --   CALCIUM 9.6  --    GFR: Estimated Creatinine Clearance: 17 mL/min (A) (by C-G formula based on SCr of 3.06 mg/dL (H)). Liver Function Tests: No results for input(s): "AST", "ALT", "ALKPHOS", "BILITOT", "PROT", "ALBUMIN" in the last 168 hours. No results for input(s): "LIPASE", "AMYLASE" in the last 168 hours. No results for input(s): "  AMMONIA" in the last 168 hours. Coagulation Profile: No results for input(s): "INR", "PROTIME" in the last 168 hours. Cardiac Enzymes: No results for input(s): "CKTOTAL", "CKMB", "CKMBINDEX", "TROPONINI" in the last 168 hours. BNP (last 3 results) No results for input(s): "PROBNP" in the last 8760 hours. HbA1C: No results for input(s): "HGBA1C" in the last 72 hours. CBG: No results for input(s): "GLUCAP" in the last 168 hours. Lipid Profile: No results for input(s): "CHOL", "HDL", "LDLCALC", "TRIG", "CHOLHDL", "LDLDIRECT" in the last 72 hours. Thyroid Function Tests: No results for input(s): "TSH", "T4TOTAL", "FREET4", "T3FREE", "THYROIDAB" in the last 72 hours. Anemia Panel: No results for input(s): "VITAMINB12", "FOLATE", "FERRITIN", "TIBC", "IRON", "RETICCTPCT" in the last 72 hours. Urine analysis:    Component Value Date/Time   COLORURINE YELLOW 08/13/2022 1734   APPEARANCEUR CLEAR 08/13/2022 1734   LABSPEC 1.014 08/13/2022 1734   PHURINE 5.0 08/13/2022 1734   GLUCOSEU 150 (A) 08/13/2022 1734   HGBUR NEGATIVE 08/13/2022 1734   BILIRUBINUR NEGATIVE 08/13/2022 1734   KETONESUR NEGATIVE 08/13/2022 1734   PROTEINUR 100 (A) 08/13/2022 1734   UROBILINOGEN 1.0 01/21/2011 1720   NITRITE NEGATIVE 08/13/2022 1734   LEUKOCYTESUR NEGATIVE 08/13/2022 1734    Radiological Exams on Admission: CT Chest Wo Contrast  Result Date: 09/03/2022 CLINICAL DATA:  Shortness of  breath EXAM: CT CHEST WITHOUT CONTRAST TECHNIQUE: Multidetector CT imaging of the chest was performed following the standard protocol without IV contrast. RADIATION DOSE REDUCTION: This exam was performed according to the departmental dose-optimization program which includes automated exposure control, adjustment of the mA and/or kV according to patient size and/or use of iterative reconstruction technique. COMPARISON:  None Available. FINDINGS: Cardiovascular: Cardiomegaly. No pericardial effusion. Normal caliber thoracic aorta moderate calcified plaque. Mildly dilated ascending thoracic aorta measuring up to 4.1 cm. Dilated main pulmonary artery, measuring up to 4.2 cm. Severe aortic valve calcifications. Mild coronary artery calcifications. Mediastinum/Nodes: Esophagus unremarkable. Enlarged and heterogeneous thyroid. Calcified mediastinal and hilar lymph nodes, likely sequela of prior granulomatous infection. No enlarged lymph nodes seen in the chest. Lungs/Pleura: Central airways are patent. Mild focal areas of bronchiectasis seen in the right lower lobe posterior right upper lobe with associated mild interstitial thickening and architectural distortion, likely sequela of prior infection or recurrent aspiration. No consolidation, pleural effusion or pneumothorax. Upper Abdomen: Bilateral nonobstructing renal stones. Multiple cystic lesions of the left kidney, likely simple cysts, no specific follow-up imaging is necessary. Musculoskeletal: No chest wall mass or suspicious bone lesions identified. IMPRESSION: 1. Mild focal areas of bronchiectasis seen in the right lower lobe and posterior right upper lobe with associated mild interstitial thickening and architectural distortion, likely sequela of prior infection or recurrent aspiration. 2. Dilated main pulmonary artery, which can be seen in the setting of pulmonary hypertension. 3. Severe aortic valve calcifications, findings can be seen in the setting of aortic  stenosis. Correlate with echocardiography. 4. Mildly dilated ascending thoracic aorta measuring up to 4.1 cm. Recommend annual imaging followup by CTA or MRA. This recommendation follows 2010 ACCF/AHA/AATS/ACR/ASA/SCA/SCAI/SIR/STS/SVM Guidelines for the Diagnosis and Management of Patients with Thoracic Aortic Disease. Circulation. 2010; 121: X324-M010. Aortic aneurysm NOS (ICD10-I71.9) 5. Enlarged and heterogeneous thyroid. Recommend non-emergent thyroid ultrasound if clinically warranted given patient age. 6. Cardiomegaly and aortic Atherosclerosis (ICD10-I70.0). Electronically Signed   By: Allegra Lai M.D.   On: 09/03/2022 18:49   DG Chest 2 View  Result Date: 09/03/2022 CLINICAL DATA:  Shortness of breath for 6 months.  Back pain. EXAM: CHEST - 2 VIEW COMPARISON:  Radiographs 08/15/2022, 08/13/2022 and 07/20/2021. FINDINGS: The heart size and mediastinal contours are stable with cardiomegaly and aortic tortuosity. The trachea remains midline. The lungs are clear. There is no pleural effusion or pneumothorax. Stable mild degenerative changes throughout the spine without evidence of acute osseous abnormality. Telemetry leads overlie the chest. IMPRESSION: Stable cardiomegaly. No evidence of acute cardiopulmonary process. Electronically Signed   By: Carey Bullocks M.D.   On: 09/03/2022 16:28    EKG: Independently reviewed.  L and R heart cath last month:  Minimal nonobstructive CAD Mild aortic stenosis. Mean AV gradient 11 mm Hg. AVA 2.05 cm squared with index 1.1 Normal LV filling pressures. LVEDP 7 mm Hg. PCWP 8/7 with mean 6 mm Hg Mild pulmonary HTN. PAP 60/14 with mean 32 mm Hg Normal RA pressure mean 9 mm Hg Normal cardiac output 5.73 L/min, index 3.08.  Assessment and Plan: * AKI (acute kidney injury) (HCC) AKI on CKD 3 Will try gentle hydration with IVF since that's what made AKI better last month (7/9) when admitted with similar presentation, complaint, etc. Strict intake and  output Repeat BMP in AM Looks like he's got an RTA on BMPs over the past month. Call nephrology in AM Follows with Dr. Thedore Mins at [facility] it sounds like (who very conveniently happens to be the nephrologist on call during day tomorrow it looks like). Hold ACEi and Jardiance in setting of AKI. Will check renal US  Dyspnea on exertion Not entirely clear as to the cause of this. While echo, BNP elevations, etc were worrisome for severe cardiac disease, his recent R and L heart cath in [facility] July didn't really demonstrate severe cardiac disease it seems.  Specifically it showed mild abnormalities as listed in results section above. Check COVID-19 since there seems to be a local outbreak of this over the past week. I wonder if his chronic RTA might be contributing to his feeling of shortness of breath, I see that he seems to have persistently low bicarbs this past month with his kidney disease.  Question if he doesn't need to be on PO bicarbonate? ABG pending Call nephrology in AM Ill start low dose PO bicarb in the mean time pending nephrologist evaluation.  History of anemia due to CKD HGB worse at 8.8 today (previously in the 9 range), likely due to AKI today. Repeat CBC in AM  Malignant neoplasm of prostate (HCC) Reduce Darolutamide dose to 300 BID for the moment due to renal fxn, at least until AKI improves / resolved.  Essential hypertension Cont home amlodipine, coreg, hydralazine. Holding ACEi in setting of AKI.      Advance Care Planning:   Code Status: Full Code  Consults: None  Family Communication: No family in room  Severity of Illness: The appropriate patient status for this patient is OBSERVATION. Observation status is judged to be reasonable and necessary in order to provide the required intensity of service to ensure the patient's safety. The patient's presenting symptoms, physical exam findings, and initial radiographic and laboratory data in the context of  their medical condition is felt to place them at decreased risk for further clinical deterioration. Furthermore, it is anticipated that the patient will be medically stable for discharge from the hospital within 2 midnights of admission.   Author: Hillary Bow., DO 09/04/2022 12:52 AM  For on call review [email or web address].

## 2022-09-04 NOTE — Progress Notes (Signed)
Patient is admitted with acute kidney injury. He is alert and oriented x4. Baseline mobility is that he walks independently at home with a cane. Denied pain. BP runs high but did lower w/po hydralazine. He had an elevated BNP of 1767. Receiving fluids of LR@100 /hr which ends at 1044. Nephrology consulted to see patient this morning. BUN is 47 and creatinine is 2.63. Patient denied pain. Covid test was negative. Tele called to report an 8 beat run of v-tach-reported to MD -no new orders.

## 2022-09-05 ENCOUNTER — Other Ambulatory Visit (HOSPITAL_COMMUNITY): Payer: Self-pay

## 2022-09-05 ENCOUNTER — Encounter (HOSPITAL_COMMUNITY): Payer: Self-pay

## 2022-09-05 ENCOUNTER — Inpatient Hospital Stay (HOSPITAL_COMMUNITY): Payer: Medicare Other

## 2022-09-05 DIAGNOSIS — I2609 Other pulmonary embolism with acute cor pulmonale: Secondary | ICD-10-CM

## 2022-09-05 DIAGNOSIS — Z86711 Personal history of pulmonary embolism: Secondary | ICD-10-CM

## 2022-09-05 DIAGNOSIS — I2699 Other pulmonary embolism without acute cor pulmonale: Secondary | ICD-10-CM | POA: Insufficient documentation

## 2022-09-05 DIAGNOSIS — N179 Acute kidney failure, unspecified: Secondary | ICD-10-CM | POA: Diagnosis not present

## 2022-09-05 HISTORY — DX: Other pulmonary embolism without acute cor pulmonale: I26.99

## 2022-09-05 LAB — ECHOCARDIOGRAM COMPLETE
Height: 70 in
Weight: 2345.69 oz

## 2022-09-05 MED ORDER — HEPARIN (PORCINE) 25000 UT/250ML-% IV SOLN
1050.0000 [IU]/h | INTRAVENOUS | Status: AC
  Administered 2022-09-05: 1050 [IU]/h via INTRAVENOUS
  Filled 2022-09-05 (×2): qty 250

## 2022-09-05 MED ORDER — TECHNETIUM TO 99M ALBUMIN AGGREGATED
4.2000 | Freq: Once | INTRAVENOUS | Status: AC | PRN
  Administered 2022-09-05: 4.2 via INTRAVENOUS

## 2022-09-05 MED ORDER — LACTATED RINGERS IV SOLN: INTRAVENOUS | Status: AC

## 2022-09-05 NOTE — Progress Notes (Signed)
PROGRESS NOTE    Derek Blackburn  ZOX:[id number] DOB: [birth year] DOA: 09/03/2022 PCP: Diamantina Providence, FNP     Brief Narrative:  Derek Blackburn is an 87 year old male with past medical history significant for CKD, hypertension.  He had a hospitalization in 7/9 for dyspnea on exertion, had AKI which improved with hydration.  He was readmitted 7/16 for shortness of breath and hypertensive urgency.  At that time, patient underwent extensive cardiac workup including echocardiogram, heart cath, was largely unremarkable for severe cardiac disease.  He now returns for continued dyspnea on exertion.   New events last 24 hours / Subjective: Patient quite tearful today.  He is very frustrated because about a month ago, he was pretty active, was able to go to the [facility].  He states that he feels great overall, aside from shortness of breath with exertion which limits his physical activity.  He denies any chest pain.  He is not requiring any oxygen and remains stable on room air.  He does have some conversational dyspnea.  Assessment & Plan:   Principal Problem:   AKI (acute kidney injury) (HCC) Active Problems:   Dyspnea on exertion   Malignant neoplasm of prostate (HCC)   History of anemia due to CKD   Chronic kidney disease, stage 3 unspecified (HCC)   Essential hypertension   Acute pulmonary embolism (HCC)    AKI on CKD stage IIIb -Baseline creatinine 1.8 -Renal ultrasound showed large bilateral simple renal cysts as well as heterogenous structure within the urinary bladder -IV fluid -Patient followed by Dr. Thedore Mins at [facility] -AKI improving    PE -VQ scan showed high probability of pulmonary embolism.  Was unable to do CTA due to his kidney dysfunction -This explains his dyspnea on exertion -Start IV heparin -Check DVT ultrasound -Check echocardiogram   Non-anion gap metabolic acidosis -Sodium bicarb tablet -Follow up with Nephrology    Anemia of  chronic disease -Hemoglobin stable   Prostate cancer -Followed by Dr. Mena Goes Merleen Nicely    Hypertension -Amlodipine, Coreg, hydralazine -Holding ACE inhibitor in setting of AKI   DVT prophylaxis: IV heparin Code Status: Full Family Communication: None at bedside  Disposition Plan: Home Status is: Inpatient Remains inpatient appropriate because: IV heparin     Antimicrobials:  Anti-infectives (From admission, onward)    None        Objective: Vitals:   09/04/22 2321 09/05/22 0313 09/05/22 0706 09/05/22 1255  BP: 133/87 137/85 122/83 138/87  Pulse: 86 84 79 88  Resp: 18 (!) 23 18 19   Temp: 98 F (36.7 C) 98.7 F (37.1 C) 98.5 F (36.9 C) 98.2 F (36.8 C)  TempSrc: Oral Oral Oral Oral  SpO2: 95% 94% 94% 93%  Weight:      Height:        Intake/Output Summary (Last 24 hours) at 09/05/2022 1416 Last data filed at 09/05/2022 1300 Gross per 24 hour  Intake 392.41 ml  Output 600 ml  Net -207.59 ml   Filed Weights   09/04/22 0505  Weight: 66.5 kg    Examination:  General exam: Appears calm and comfortable  Respiratory system: Clear to auscultation. Respiratory effort normal. No respiratory distress. Mild conversational dyspnea.  Cardiovascular system: S1 & S2 heard, RRR. No murmurs. No pedal edema. Gastrointestinal system: Abdomen is nondistended, soft and nontender. Normal bowel sounds heard. Central nervous system: Alert and oriented. No focal neurological deficits. Speech clear.  Extremities: Symmetric in appearance  Skin: No rashes, lesions  or ulcers on exposed skin  Psychiatry: Judgement and insight appear normal. Mood & affect appropriate.   Data Reviewed: I have personally reviewed following labs and imaging studies  CBC: Recent Labs  Lab 09/03/22 1519 09/03/22 2310 09/04/22 0300  WBC 8.2  --  9.3  HGB 9.6* 8.8* 9.1*  HCT 32.0* 26.0* 30.3*  MCV 97.9  --  96.5  PLT 252  --  231   Basic Metabolic Panel: Recent Labs  Lab 09/03/22 1519  09/03/22 2310 09/04/22 0300 09/05/22 0239  NA 143 145 143 139  K 4.3 4.3 4.0 4.5  CL 107  --  109 112*  CO2 20*  --  19* 18*  GLUCOSE 136*  --  147* 94  BUN 50*  --  47* 38*  CREATININE 3.06*  --  2.63* 2.03*  CALCIUM 9.6  --  9.2 8.8*   GFR: Estimated Creatinine Clearance: 25 mL/min (A) (by C-G formula based on SCr of 2.03 mg/dL (H)). Liver Function Tests: No results for input(s): "AST", "ALT", "ALKPHOS", "BILITOT", "PROT", "ALBUMIN" in the last 168 hours. No results for input(s): "LIPASE", "AMYLASE" in the last 168 hours. No results for input(s): "AMMONIA" in the last 168 hours. Coagulation Profile: No results for input(s): "INR", "PROTIME" in the last 168 hours. Cardiac Enzymes: No results for input(s): "CKTOTAL", "CKMB", "CKMBINDEX", "TROPONINI" in the last 168 hours. BNP (last 3 results) No results for input(s): "PROBNP" in the last 8760 hours. HbA1C: No results for input(s): "HGBA1C" in the last 72 hours. CBG: No results for input(s): "GLUCAP" in the last 168 hours. Lipid Profile: No results for input(s): "CHOL", "HDL", "LDLCALC", "TRIG", "CHOLHDL", "LDLDIRECT" in the last 72 hours. Thyroid Function Tests: No results for input(s): "TSH", "T4TOTAL", "FREET4", "T3FREE", "THYROIDAB" in the last 72 hours. Anemia Panel: No results for input(s): "VITAMINB12", "FOLATE", "FERRITIN", "TIBC", "IRON", "RETICCTPCT" in the last 72 hours. Sepsis Labs: Recent Labs  Lab 09/03/22 2324  LATICACIDVEN 1.3    Recent Results (from the past 240 hour(s))  SARS Coronavirus 2 by RT PCR (hospital order, performed in [facility] hospital lab) *cepheid single result test* Anterior Nasal Swab     Status: None   Collection Time: 09/04/22  1:22 AM   Specimen: Anterior Nasal Swab  Result Value Ref Range Status   SARS Coronavirus 2 by RT PCR NEGATIVE NEGATIVE Final    Comment: Performed at [facility], [address] [place], [place] [address]      Radiology Studies: NM Pulmonary  Perfusion  Result Date: 09/05/2022 CLINICAL DATA:  Pulmonary embolism (PE) suspected, low to intermediate prob, positive D-dimer EXAM: NUCLEAR MEDICINE PERFUSION LUNG SCAN TECHNIQUE: Perfusion images were obtained in multiple projections after intravenous injection of radiopharmaceutical. Ventilation scans intentionally deferred if perfusion scan and chest x-ray adequate for interpretation during COVID 19 epidemic. RADIOPHARMACEUTICALS:  4.2 mCi Tc-64m MAA IV COMPARISON:  X-ray 09/05/2022 FINDINGS: Multiple large mismatched segmental perfusion defects throughout both lungs. IMPRESSION: High probability for pulmonary embolism. These results will be called to the ordering clinician or representative by the Radiologist Assistant, and communication documented in the PACS or [employer]. Electronically Signed   By: Duanne Guess D.O.   On: 09/05/2022 13:49   DG CHEST PORT 1 VIEW  Result Date: 09/05/2022 CLINICAL DATA:  Shortness of breath.  Pulmonary emboli. EXAM: PORTABLE CHEST 1 VIEW COMPARISON:  Radiographs 09/03/2022 and 08/15/2022. Noncontrast chest CT 09/03/2022. FINDINGS: 0820 hours. Slightly lower lung volumes. The heart size and mediastinal contours are stable with  cardiomegaly and aortic tortuosity. The lungs remain clear. No evidence of pneumothorax or significant pleural effusion. No acute osseous findings. Telemetry leads overlie the chest. IMPRESSION: Slightly lower lung volumes. No other significant changes or acute findings identified. Electronically Signed   By: Carey Bullocks M.D.   On: 09/05/2022 09:06   US RENAL  Result Date: 09/04/2022 CLINICAL DATA:  Acute kidney injury. EXAM: RENAL / URINARY TRACT ULTRASOUND COMPLETE COMPARISON:  September 08, 2020 FINDINGS: Right Kidney: Renal measurements: 11.3 cm x 5.0 cm x 5.0 cm = volume: 146.9 mL. Echogenicity within normal limits. 4.9 cm x 4.4 cm x 5.2 cm and 4.7 cm x 4.9 cm x 5.0 cm simple right renal cysts are seen. No hydronephrosis is  visualized. Left Kidney: Renal measurements: 11.4 cm x 6.7 cm x 4.7 cm = volume: 186.8 mL. Echogenicity within normal limits. 3.0 cm x 2.3 cm x 2.7 cm and 3.1 cm x 2.7 cm x 2.8 cm simple left renal cysts are seen. No hydronephrosis is visualized. Bladder: A 1.8 cm x 1.6 cm x 3.3 cm heterogeneous echogenic structure is seen along the nondependent wall of the urinary bladder lumen. A similar appearing area is seen on the prior study. No abnormal flow is seen within this region on color Doppler evaluation. Other: None. IMPRESSION: 1. Large bilateral simple renal cysts. 2. Heterogeneous structure within the urinary bladder lumen which may represent sequelae associated with an enlarged prostate gland. A urinary bladder mass cannot completely be excluded. Urology consult is recommended. Electronically Signed   By: Aram Candela M.D.   On: 09/04/2022 03:14   CT Chest Wo Contrast  Result Date: 09/03/2022 CLINICAL DATA:  Shortness of breath EXAM: CT CHEST WITHOUT CONTRAST TECHNIQUE: Multidetector CT imaging of the chest was performed following the standard protocol without IV contrast. RADIATION DOSE REDUCTION: This exam was performed according to the departmental dose-optimization program which includes automated exposure control, adjustment of the mA and/or kV according to patient size and/or use of iterative reconstruction technique. COMPARISON:  None Available. FINDINGS: Cardiovascular: Cardiomegaly. No pericardial effusion. Normal caliber thoracic aorta moderate calcified plaque. Mildly dilated ascending thoracic aorta measuring up to 4.1 cm. Dilated main pulmonary artery, measuring up to 4.2 cm. Severe aortic valve calcifications. Mild coronary artery calcifications. Mediastinum/Nodes: Esophagus unremarkable. Enlarged and heterogeneous thyroid. Calcified mediastinal and hilar lymph nodes, likely sequela of prior granulomatous infection. No enlarged lymph nodes seen in the chest. Lungs/Pleura: Central airways are  patent. Mild focal areas of bronchiectasis seen in the right lower lobe posterior right upper lobe with associated mild interstitial thickening and architectural distortion, likely sequela of prior infection or recurrent aspiration. No consolidation, pleural effusion or pneumothorax. Upper Abdomen: Bilateral nonobstructing renal stones. Multiple cystic lesions of the left kidney, likely simple cysts, no specific follow-up imaging is necessary. Musculoskeletal: No chest wall mass or suspicious bone lesions identified. IMPRESSION: 1. Mild focal areas of bronchiectasis seen in the right lower lobe and posterior right upper lobe with associated mild interstitial thickening and architectural distortion, likely sequela of prior infection or recurrent aspiration. 2. Dilated main pulmonary artery, which can be seen in the setting of pulmonary hypertension. 3. Severe aortic valve calcifications, findings can be seen in the setting of aortic stenosis. Correlate with echocardiography. 4. Mildly dilated ascending thoracic aorta measuring up to 4.1 cm. Recommend annual imaging followup by CTA or MRA. This recommendation follows 2010 ACCF/AHA/AATS/ACR/ASA/SCA/SCAI/SIR/STS/SVM Guidelines for the Diagnosis and Management of Patients with Thoracic Aortic Disease. Circulation. 2010; 121: Q034-V425. Aortic aneurysm NOS (  ICD10-I71.9) 5. Enlarged and heterogeneous thyroid. Recommend non-emergent thyroid ultrasound if clinically warranted given patient age. 6. Cardiomegaly and aortic Atherosclerosis (ICD10-I70.0). Electronically Signed   By: Allegra Lai M.D.   On: 09/03/2022 18:49   DG Chest 2 View  Result Date: 09/03/2022 CLINICAL DATA:  Shortness of breath for 6 months.  Back pain. EXAM: CHEST - 2 VIEW COMPARISON:  Radiographs 08/15/2022, 08/13/2022 and 07/20/2021. FINDINGS: The heart size and mediastinal contours are stable with cardiomegaly and aortic tortuosity. The trachea remains midline. The lungs are clear. There is no  pleural effusion or pneumothorax. Stable mild degenerative changes throughout the spine without evidence of acute osseous abnormality. Telemetry leads overlie the chest. IMPRESSION: Stable cardiomegaly. No evidence of acute cardiopulmonary process. Electronically Signed   By: Carey Bullocks M.D.   On: 09/03/2022 16:28      Scheduled Meds:  amLODipine  10 mg Oral Daily   atorvastatin  10 mg Oral Daily   carvedilol  12.5 mg Oral BID WC   darolutamide  300 mg Oral BID WC   donepezil  10 mg Oral Daily   hydrALAZINE  75 mg Oral Q8H   latanoprost  1 drop Both Eyes QHS   leflunomide  20 mg Oral Daily   loratadine  10 mg Oral Daily   sodium bicarbonate  650 mg Oral Daily   timolol  1 drop Both Eyes BID   Continuous Infusions:  heparin     lactated ringers Stopped (09/05/22 1327)     LOS: 1 day   Time spent: 40 minutes   Noralee Stain, DO [facility] 09/05/2022, 2:16 PM   Available via Epic secure chat 7am-7pm After these hours, please refer to coverage provider listed on [email or web address]

## 2022-09-05 NOTE — Plan of Care (Signed)

## 2022-09-05 NOTE — Progress Notes (Signed)
VASCULAR LAB    Bilateral lower extremity venous duplex has been performed.  See CV proc for preliminary results.   , , RVT 09/05/2022, 2:29 PM

## 2022-09-05 NOTE — Progress Notes (Signed)
ANTICOAGULATION CONSULT NOTE - Initial Consult  Pharmacy Consult for heparin Indication: pulmonary embolus  No Known Allergies  Patient Measurements: Height: 5\' 10"  (177.8 cm) Weight: 66.5 kg (146 lb 9.7 oz) IBW/kg (Calculated) : 73    Vital Signs: Temp: 98.2 F (36.8 C) (08/08 1255) Temp Source: Oral (08/08 1255) BP: 138/87 (08/08 1255) Pulse Rate: 88 (08/08 1255)  Labs: Recent Labs    09/03/22 1519 09/03/22 1951 09/03/22 2310 09/04/22 0300 09/05/22 0239  HGB 9.6*  --  8.8* 9.1*  --   HCT 32.0*  --  26.0* 30.3*  --   PLT 252  --   --  231  --   CREATININE 3.06*  --   --  2.63* 2.03*  TROPONINIHS 36* 41*  --   --   --     Estimated Creatinine Clearance: 25 mL/min (A) (by C-G formula based on SCr of 2.03 mg/dL (H)).   Medical History: Past Medical History:  Diagnosis Date   Anemia    CKD (chronic kidney disease)    Heart murmur    Hypertension    Hypertensive urgency    Peripheral polyneuropathy    Prostate cancer (HCC)    PVD (peripheral vascular disease) (HCC)    Rheumatoid arthritis involving multiple sites with positive rheumatoid factor (HCC) 08/13/2022    Medications:  Medications Prior to Admission  Medication Sig Dispense Refill Last Dose   acetaminophen (TYLENOL) 650 MG CR tablet Take 650 mg by mouth at bedtime.   09/03/2022   amLODipine (NORVASC) 10 MG tablet Take 1 tablet (10 mg total) by mouth daily. 90 tablet 3 09/03/2022   Apoaequorin (PREVAGEN) 10 MG CAPS Take 10 mg by mouth daily.   09/03/2022   atorvastatin (LIPITOR) 10 MG tablet Take 1 tablet (10 mg total) by mouth daily. 30 tablet 1 09/03/2022   Calcium Carbonate (CALCIUM 500 PO) Take 500 mg by mouth daily.   09/03/2022   carvedilol (COREG) 12.5 MG tablet Take 1 tablet (12.5 mg total) by mouth 2 (two) times daily with a meal. 60 tablet 2 09/03/2022   darolutamide (NUBEQA) 300 MG tablet Take 600 mg by mouth 2 (two) times daily with a meal. This medication treats Prostate Cancer.   09/03/2022    donepezil (ARICEPT) 10 MG tablet Take 1 tablet (10 mg total) by mouth daily. 30 tablet 11 09/03/2022   hydrALAZINE (APRESOLINE) 25 MG tablet Take 3 tablets (75 mg total) by mouth 3 (three) times daily. 90 tablet 1 09/03/2022   JARDIANCE 10 MG TABS tablet Take 10 mg by mouth daily.      latanoprost (XALATAN) 0.005 % ophthalmic solution Place 1 drop into both eyes at bedtime.   09/02/2022   leflunomide (ARAVA) 20 MG tablet Take 20 mg by mouth daily.   09/03/2022   loratadine (CLARITIN) 10 MG tablet Take 10 mg by mouth daily.   09/02/2022   Multiple Vitamin (MULITIVITAMIN WITH MINERALS) TABS Take 1 tablet by mouth daily.   09/03/2022   Omega-3 Fatty Acids (FISH OIL) 1200 MG CAPS Take 1,200 mg by mouth daily.   09/03/2022   Potassium 99 MG TABS Take 99 mg by mouth daily.   09/03/2022   timolol (TIMOPTIC) 0.5 % ophthalmic solution Place 1 drop into both eyes 2 (two) times daily.   09/03/2022   trandolapril (MAVIK) 4 MG tablet Take 4 mg by mouth daily.   09/03/2022   vitamin B-12 (CYANOCOBALAMIN) 100 MCG tablet Take 100 mcg by mouth daily.   09/03/2022  VITAMIN D, CHOLECALCIFEROL, PO Take 1 tablet by mouth daily.   09/03/2022   Scheduled:   amLODipine  10 mg Oral Daily   atorvastatin  10 mg Oral Daily   carvedilol  12.5 mg Oral BID WC   darolutamide  300 mg Oral BID WC   donepezil  10 mg Oral Daily   hydrALAZINE  75 mg Oral Q8H   latanoprost  1 drop Both Eyes QHS   leflunomide  20 mg Oral Daily   loratadine  10 mg Oral Daily   sodium bicarbonate  650 mg Oral Daily   timolol  1 drop Both Eyes BID    Assessment: 87 yo male with VQ showing high probability of PE. He is noted with history of prostate cancer. Pharmacy consulted to dose heparin -hg= 9.1 (stable), plt= 213 -30mg  lovenox sq given at 8am -no anticoagulants noted PTA  Goal of Therapy:  Heparin level 0.3-0.7 units/ml Monitor platelets by anticoagulation protocol: Yes   Plan:  -No heparin bolus with recent lovenox -start heparin infusion at 1050  units/hr -heparin level in 8 hrs -Daily heparin level  and CBC  Harland German, PharmD Clinical Pharmacist **Pharmacist phone directory can now be found on [email or web address] ([email or web address]).  Listed under [facility].

## 2022-09-05 NOTE — Progress Notes (Addendum)
Mobility Specialist: Progress Note   09/05/22 1020  Mobility  Activity Ambulated with assistance in hallway  Level of Assistance Contact guard assist, steadying assist  Assistive Device Cane  Distance Ambulated (ft) 110 ft  Activity Response Tolerated well  Mobility Referral Yes  $Mobility charge 1 Mobility  Mobility Specialist Start Time (ACUTE ONLY) 0950  Mobility Specialist Stop Time (ACUTE ONLY) 1018  Mobility Specialist Time Calculation (min) (ACUTE ONLY) 28 min   Pre-Mobility: HR 82, BP 113/75, SpO2 96% RA During Mobility: HR 104-107, SpO2 92% RA Post-Mobility: HR 86-89  Pt was agreeable to mobility session - received in bed. MinA for STS, CG for ambulation. Require 1x standing break for SOB (~30secs). Denies any dizziness, fatigue, or pain. Ambulation limited d/t DOE. Left in BR with all needs met. RN notified.  Maurene Capes Mobility Specialist Please contact via SecureChat or Rehab office at [phone]

## 2022-09-05 NOTE — TOC Benefit Eligibility Note (Signed)
Patient Product/process development scientist completed.    The patient is insured through [facility]. Patient has Medicare and is not eligible for a copay card, but may be able to apply for patient assistance, if available.    Ran test claim for Eliquis 5 mg and the current 30 day co-pay is $47.00.  Ran test claim for Xarelto 20 mg and the current 30 day co-pay is $47.00.  This test claim was processed through [facility]- copay amounts may vary at other pharmacies due to pharmacy/plan contracts, or as the patient moves through the different stages of their insurance plan.     Roland Earl, CPHT Pharmacy Patient Advocate Specialist [facility] Patient Advocate Team Direct Number: [phone]  Fax: [phone]

## 2022-09-05 NOTE — Progress Notes (Signed)
*  PRELIMINARY RESULTS* Echocardiogram 2D Echocardiogram has been performed.  Laddie Aquas 09/05/2022, 5:31 PM

## 2022-09-06 ENCOUNTER — Encounter (HOSPITAL_COMMUNITY): Payer: Self-pay

## 2022-09-06 ENCOUNTER — Ambulatory Visit (HOSPITAL_COMMUNITY): Payer: Medicare Other

## 2022-09-06 DIAGNOSIS — I82409 Acute embolism and thrombosis of unspecified deep veins of unspecified lower extremity: Secondary | ICD-10-CM

## 2022-09-06 DIAGNOSIS — N179 Acute kidney failure, unspecified: Secondary | ICD-10-CM | POA: Diagnosis not present

## 2022-09-06 HISTORY — DX: Acute embolism and thrombosis of unspecified deep veins of unspecified lower extremity: I82.409

## 2022-09-06 LAB — HEPARIN LEVEL (UNFRACTIONATED): Heparin Unfractionated: 0.48 IU/mL (ref 0.30–0.70)

## 2022-09-06 NOTE — Progress Notes (Signed)
Mobility Specialist Progress Note:   09/06/22 1141  Mobility  Activity Ambulated with assistance in hallway  Level of Assistance Contact guard assist, steadying assist  Assistive Device Cane  Distance Ambulated (ft) 125 ft  Activity Response Tolerated well  Mobility Referral Yes  $Mobility charge 1 Mobility  Mobility Specialist Start Time (ACUTE ONLY) 0930  Mobility Specialist Stop Time (ACUTE ONLY) 1030  Mobility Specialist Time Calculation (min) (ACUTE ONLY) 60 min   Pre Mobility: 97 HR , 159/85 BP , 95% SpO2 During Mobility: 115 HR , 96% SpO2 Post Mobility: 95 HR ,97% SpO2  Pt received in chair agreeable to mobility. 2x standing rest break required d/t SOB. Pt leaning on rail during rest break to catch breath. Pursed lip breathing encouraged. Pt denied any lightheadedness during ambulation. Pt returned to chair asymptomatic with call bell in hand and all needs met.   Leory Plowman  Mobility Specialist Please contact via [employer] office at [phone]

## 2022-09-06 NOTE — Progress Notes (Signed)
ANTICOAGULATION CONSULT NOTE  Pharmacy Consult for heparin Indication: pulmonary embolus  No Known Allergies  Patient Measurements: Height: 5\' 10"  (177.8 cm) Weight: 66.5 kg (146 lb 9.7 oz) IBW/kg (Calculated) : 73    Vital Signs: Temp: 97.6 F (36.4 C) (08/09 0755) Temp Source: Oral (08/09 0755) BP: 142/83 (08/09 0918) Pulse Rate: 89 (08/09 0755)  Labs: Recent Labs    09/03/22 1519 09/03/22 1951 09/03/22 2310 09/04/22 0300 09/05/22 0239 09/05/22 2344 09/06/22 0626  HGB 9.6*  --  8.8* 9.1*  --  8.4*  --   HCT 32.0*  --  26.0* 30.3*  --  27.7*  --   PLT 252  --   --  231  --  230  --   HEPARINUNFRC  --   --   --   --   --  0.35 0.48  CREATININE 3.06*  --   --  2.63* 2.03* 2.10*  --   TROPONINIHS 36* 41*  --   --   --   --   --     Estimated Creatinine Clearance: 24.2 mL/min (A) (by C-G formula based on SCr of 2.1 mg/dL (H)).   Medical History: Past Medical History:  Diagnosis Date   Anemia    CKD (chronic kidney disease)    Heart murmur    Hypertension    Hypertensive urgency    Peripheral polyneuropathy    Prostate cancer (HCC)    PVD (peripheral vascular disease) (HCC)    Rheumatoid arthritis involving multiple sites with positive rheumatoid factor (HCC) 08/13/2022    Medications:  Medications Prior to Admission  Medication Sig Dispense Refill Last Dose   acetaminophen (TYLENOL) 650 MG CR tablet Take 650 mg by mouth at bedtime.   09/03/2022   amLODipine (NORVASC) 10 MG tablet Take 1 tablet (10 mg total) by mouth daily. 90 tablet 3 09/03/2022   Apoaequorin (PREVAGEN) 10 MG CAPS Take 10 mg by mouth daily.   09/03/2022   atorvastatin (LIPITOR) 10 MG tablet Take 1 tablet (10 mg total) by mouth daily. 30 tablet 1 09/03/2022   Calcium Carbonate (CALCIUM 500 PO) Take 500 mg by mouth daily.   09/03/2022   carvedilol (COREG) 12.5 MG tablet Take 1 tablet (12.5 mg total) by mouth 2 (two) times daily with a meal. 60 tablet 2 09/03/2022   darolutamide (NUBEQA) 300 MG tablet  Take 600 mg by mouth 2 (two) times daily with a meal. This medication treats Prostate Cancer.   09/03/2022   donepezil (ARICEPT) 10 MG tablet Take 1 tablet (10 mg total) by mouth daily. 30 tablet 11 09/03/2022   hydrALAZINE (APRESOLINE) 25 MG tablet Take 3 tablets (75 mg total) by mouth 3 (three) times daily. 90 tablet 1 09/03/2022   JARDIANCE 10 MG TABS tablet Take 10 mg by mouth daily.      latanoprost (XALATAN) 0.005 % ophthalmic solution Place 1 drop into both eyes at bedtime.   09/02/2022   leflunomide (ARAVA) 20 MG tablet Take 20 mg by mouth daily.   09/03/2022   loratadine (CLARITIN) 10 MG tablet Take 10 mg by mouth daily.   09/02/2022   Multiple Vitamin (MULITIVITAMIN WITH MINERALS) TABS Take 1 tablet by mouth daily.   09/03/2022   Omega-3 Fatty Acids (FISH OIL) 1200 MG CAPS Take 1,200 mg by mouth daily.   09/03/2022   Potassium 99 MG TABS Take 99 mg by mouth daily.   09/03/2022   timolol (TIMOPTIC) 0.5 % ophthalmic solution Place 1 drop into  both eyes 2 (two) times daily.   09/03/2022   trandolapril (MAVIK) 4 MG tablet Take 4 mg by mouth daily.   09/03/2022   vitamin B-12 (CYANOCOBALAMIN) 100 MCG tablet Take 100 mcg by mouth daily.   09/03/2022   VITAMIN D, CHOLECALCIFEROL, PO Take 1 tablet by mouth daily.   09/03/2022   Scheduled:   amLODipine  10 mg Oral Daily   atorvastatin  10 mg Oral Daily   carvedilol  12.5 mg Oral BID WC   darolutamide  300 mg Oral BID WC   donepezil  10 mg Oral Daily   hydrALAZINE  75 mg Oral Q8H   latanoprost  1 drop Both Eyes QHS   leflunomide  20 mg Oral Daily   loratadine  10 mg Oral Daily   sodium bicarbonate  650 mg Oral Daily   timolol  1 drop Both Eyes BID    Assessment: 87 yo male with VQ showing high probability of PE and venous dopler with R LLE DVT . He is noted with history of prostate cancer. Pharmacy consulted to dose heparin -hg= 8/4  -no anticoagulants noted PTA  Goal of Therapy:  Heparin level 0.3-0.7 units/ml Monitor platelets by anticoagulation  protocol: Yes   Plan:  -Continue heparin infusion at 1050 units/hr -Daily heparin level  and CBC -Will follow oral anticoagulation plans (apixaban= $47)  Harland German, PharmD Clinical Pharmacist **Pharmacist phone directory can now be found on [email or web address] ([email or web address]).  Listed under [facility].

## 2022-09-06 NOTE — Progress Notes (Signed)
ANTICOAGULATION CONSULT NOTE   Pharmacy Consult for heparin Indication: pulmonary embolus  No Known Allergies  Patient Measurements: Height: 5\' 10"  (177.8 cm) Weight: 66.5 kg (146 lb 9.7 oz) IBW/kg (Calculated) : 73    Vital Signs: Temp: 98.4 F (36.9 C) (08/08 2321) Temp Source: Oral (08/08 2321) BP: 128/87 (08/08 2321) Pulse Rate: 80 (08/08 2321)  Labs: Recent Labs    09/03/22 1519 09/03/22 1951 09/03/22 2310 09/04/22 0300 09/05/22 0239 09/05/22 2344  HGB 9.6*  --  8.8* 9.1*  --  8.4*  HCT 32.0*  --  26.0* 30.3*  --  27.7*  PLT 252  --   --  231  --  230  HEPARINUNFRC  --   --   --   --   --  0.35  CREATININE 3.06*  --   --  2.63* 2.03* 2.10*  TROPONINIHS 36* 41*  --   --   --   --     Estimated Creatinine Clearance: 24.2 mL/min (A) (by C-G formula based on SCr of 2.1 mg/dL (H)).   Medical History: Past Medical History:  Diagnosis Date   Anemia    CKD (chronic kidney disease)    Heart murmur    Hypertension    Hypertensive urgency    Peripheral polyneuropathy    Prostate cancer (HCC)    PVD (peripheral vascular disease) (HCC)    Rheumatoid arthritis involving multiple sites with positive rheumatoid factor (HCC) 08/13/2022    Medications:  Medications Prior to Admission  Medication Sig Dispense Refill Last Dose   acetaminophen (TYLENOL) 650 MG CR tablet Take 650 mg by mouth at bedtime.   09/03/2022   amLODipine (NORVASC) 10 MG tablet Take 1 tablet (10 mg total) by mouth daily. 90 tablet 3 09/03/2022   Apoaequorin (PREVAGEN) 10 MG CAPS Take 10 mg by mouth daily.   09/03/2022   atorvastatin (LIPITOR) 10 MG tablet Take 1 tablet (10 mg total) by mouth daily. 30 tablet 1 09/03/2022   Calcium Carbonate (CALCIUM 500 PO) Take 500 mg by mouth daily.   09/03/2022   carvedilol (COREG) 12.5 MG tablet Take 1 tablet (12.5 mg total) by mouth 2 (two) times daily with a meal. 60 tablet 2 09/03/2022   darolutamide (NUBEQA) 300 MG tablet Take 600 mg by mouth 2 (two) times daily with  a meal. This medication treats Prostate Cancer.   09/03/2022   donepezil (ARICEPT) 10 MG tablet Take 1 tablet (10 mg total) by mouth daily. 30 tablet 11 09/03/2022   hydrALAZINE (APRESOLINE) 25 MG tablet Take 3 tablets (75 mg total) by mouth 3 (three) times daily. 90 tablet 1 09/03/2022   JARDIANCE 10 MG TABS tablet Take 10 mg by mouth daily.      latanoprost (XALATAN) 0.005 % ophthalmic solution Place 1 drop into both eyes at bedtime.   09/02/2022   leflunomide (ARAVA) 20 MG tablet Take 20 mg by mouth daily.   09/03/2022   loratadine (CLARITIN) 10 MG tablet Take 10 mg by mouth daily.   09/02/2022   Multiple Vitamin (MULITIVITAMIN WITH MINERALS) TABS Take 1 tablet by mouth daily.   09/03/2022   Omega-3 Fatty Acids (FISH OIL) 1200 MG CAPS Take 1,200 mg by mouth daily.   09/03/2022   Potassium 99 MG TABS Take 99 mg by mouth daily.   09/03/2022   timolol (TIMOPTIC) 0.5 % ophthalmic solution Place 1 drop into both eyes 2 (two) times daily.   09/03/2022   trandolapril (MAVIK) 4 MG tablet Take  4 mg by mouth daily.   09/03/2022   vitamin B-12 (CYANOCOBALAMIN) 100 MCG tablet Take 100 mcg by mouth daily.   09/03/2022   VITAMIN D, CHOLECALCIFEROL, PO Take 1 tablet by mouth daily.   09/03/2022   Scheduled:   amLODipine  10 mg Oral Daily   atorvastatin  10 mg Oral Daily   carvedilol  12.5 mg Oral BID WC   darolutamide  300 mg Oral BID WC   donepezil  10 mg Oral Daily   hydrALAZINE  75 mg Oral Q8H   latanoprost  1 drop Both Eyes QHS   leflunomide  20 mg Oral Daily   loratadine  10 mg Oral Daily   sodium bicarbonate  650 mg Oral Daily   timolol  1 drop Both Eyes BID    Assessment: 87 yo male with VQ showing high probability of PE. He is noted with history of prostate cancer. Pharmacy consulted to dose heparin -hg= 9.1 (stable), plt= 213 -30mg  lovenox sq given at 8am -no anticoagulants noted PTA  8/9 AM update:  Heparin level therapeutic   Goal of Therapy:  Heparin level 0.3-0.7 units/ml Monitor platelets by  anticoagulation protocol: Yes   Plan:  -Cont heparin at 1050 units/hr -Heparin level with AM labs  Abran Duke, PharmD, BCPS Clinical Pharmacist Phone: [phone]

## 2022-09-06 NOTE — Care Management Important Message (Signed)
Important Message  Patient Details  Name: Derek Blackburn MRN: [id number] Date of Birth: 02-08-1935   Medicare Important Message Given:  Yes       09/06/2022, 3:20 PM

## 2022-09-06 NOTE — Progress Notes (Addendum)
PROGRESS NOTE    Derek Blackburn  WUJ:[id number] DOB: 11/23/1935 DOA: 09/03/2022 PCP: Diamantina Providence, FNP     Brief Narrative:  Derek Blackburn is an 87 year old male with past medical history significant for CKD, hypertension.  He had a hospitalization in 7/9 for dyspnea on exertion, had AKI which improved with hydration.  He was readmitted 7/16 for shortness of breath and hypertensive urgency.  At that time, patient underwent extensive cardiac workup including echocardiogram, heart cath, was largely unremarkable for severe cardiac disease.  He now returns for continued dyspnea on exertion.   New events last 24 hours / Subjective: Still very SOB with exertion.   Assessment & Plan:   Principal Problem:   AKI (acute kidney injury) (HCC) Active Problems:   Dyspnea on exertion   Malignant neoplasm of prostate (HCC)   History of anemia due to CKD   Chronic kidney disease, stage 3 unspecified (HCC)   Essential hypertension   Acute pulmonary embolism (HCC)    AKI on CKD stage IIIb -Baseline creatinine 1.8 -Renal ultrasound showed large bilateral simple renal cysts as well as heterogenous structure within the urinary bladder -Patient followed by Dr. Thedore Mins at [facility]   PE and DVT, provoked in setting of roadtrip  -VQ scan showed high probability of pulmonary embolism.  Was unable to do CTA due to his kidney dysfunction -This explains his dyspnea on exertion -DVT ultrasound +for DVT on right  -Echocardiogram showed RV pressure overload, pulmonary HTN  -IV heparin   Non-anion gap metabolic acidosis -Sodium bicarb tablet -Follow up with Nephrology    Anemia of chronic disease -Hemoglobin stable   Prostate cancer -Followed by Dr. Mena Goes Merleen Nicely    Hypertension -Amlodipine, Coreg, hydralazine -Holding ACE inhibitor in setting of AKI   DVT prophylaxis: IV heparin Code Status: Full Family Communication: None at bedside, updated wife last night   Disposition Plan: Home Status is: Inpatient Remains inpatient appropriate because: IV heparin     Antimicrobials:  Anti-infectives (From admission, onward)    None        Objective: Vitals:   09/06/22 0400 09/06/22 0755 09/06/22 0800 09/06/22 0918  BP: 104/77 122/78 122/78 (!) 142/83  Pulse: 72 89 87   Resp: 19 19 (!) 22   Temp:  97.6 F (36.4 C)    TempSrc:  Oral    SpO2: 92% 96% 94%   Weight:      Height:        Intake/Output Summary (Last 24 hours) at 09/06/2022 1213 Last data filed at 09/06/2022 0757 Gross per 24 hour  Intake 772.31 ml  Output 300 ml  Net 472.31 ml   Filed Weights   09/04/22 0505  Weight: 66.5 kg    Examination:  General exam: Appears calm and comfortable  Respiratory system: Clear to auscultation. Respiratory effort normal. No respiratory distress. Mild conversational dyspnea. On room air  Cardiovascular system: S1 & S2 heard, RRR. No murmurs. No pedal edema. Gastrointestinal system: Abdomen is nondistended, soft and nontender. Normal bowel sounds heard. Central nervous system: Alert and oriented. No focal neurological deficits. Speech clear.  Extremities: Symmetric in appearance  Skin: No rashes, lesions or ulcers on exposed skin  Psychiatry: Judgement and insight appear normal. Mood & affect appropriate.   Data Reviewed: I have personally reviewed following labs and imaging studies  CBC: Recent Labs  Lab 09/03/22 1519 09/03/22 2310 09/04/22 0300 09/05/22 2344  WBC 8.2  --  9.3 7.8  HGB 9.6*  8.8* 9.1* 8.4*  HCT 32.0* 26.0* 30.3* 27.7*  MCV 97.9  --  96.5 94.9  PLT 252  --  231 230   Basic Metabolic Panel: Recent Labs  Lab 09/03/22 1519 09/03/22 2310 09/04/22 0300 09/05/22 0239 09/05/22 2344  NA 143 145 143 139 136  K 4.3 4.3 4.0 4.5 3.9  CL 107  --  109 112* 107  CO2 20*  --  19* 18* 18*  GLUCOSE 136*  --  147* 94 114*  BUN 50*  --  47* 38* 37*  CREATININE 3.06*  --  2.63* 2.03* 2.10*  CALCIUM 9.6  --  9.2 8.8* 8.6*    GFR: Estimated Creatinine Clearance: 24.2 mL/min (A) (by C-G formula based on SCr of 2.1 mg/dL (H)). Liver Function Tests: No results for input(s): "AST", "ALT", "ALKPHOS", "BILITOT", "PROT", "ALBUMIN" in the last 168 hours. No results for input(s): "LIPASE", "AMYLASE" in the last 168 hours. No results for input(s): "AMMONIA" in the last 168 hours. Coagulation Profile: No results for input(s): "INR", "PROTIME" in the last 168 hours. Cardiac Enzymes: No results for input(s): "CKTOTAL", "CKMB", "CKMBINDEX", "TROPONINI" in the last 168 hours. BNP (last 3 results) No results for input(s): "PROBNP" in the last 8760 hours. HbA1C: No results for input(s): "HGBA1C" in the last 72 hours. CBG: No results for input(s): "GLUCAP" in the last 168 hours. Lipid Profile: No results for input(s): "CHOL", "HDL", "LDLCALC", "TRIG", "CHOLHDL", "LDLDIRECT" in the last 72 hours. Thyroid Function Tests: No results for input(s): "TSH", "T4TOTAL", "FREET4", "T3FREE", "THYROIDAB" in the last 72 hours. Anemia Panel: No results for input(s): "VITAMINB12", "FOLATE", "FERRITIN", "TIBC", "IRON", "RETICCTPCT" in the last 72 hours. Sepsis Labs: Recent Labs  Lab 09/03/22 2324  LATICACIDVEN 1.3    Recent Results (from the past 240 hour(s))  SARS Coronavirus 2 by RT PCR (hospital order, performed in [facility] hospital lab) *cepheid single result test* Anterior Nasal Swab     Status: None   Collection Time: 09/04/22  1:22 AM   Specimen: Anterior Nasal Swab  Result Value Ref Range Status   SARS Coronavirus 2 by RT PCR NEGATIVE NEGATIVE Final    Comment: Performed at [facility], [address], [place], [place] [address]      Radiology Studies: ECHOCARDIOGRAM COMPLETE  Result Date: 09/05/2022    ECHOCARDIOGRAM REPORT   Patient Name:   Derek Blackburn [facility] Date of Exam: 09/05/2022 Medical Rec #:  [id number]           Height:       70.0 in Accession #:    [id number]          Weight:       146.6 lb Date of  Birth:  02-Mar-1935           BSA:          1.829 m Patient Age:    87 years            BP:           118/74 mmHg Patient Gender: M                   HR:           90 bpm. Exam Location:  Inpatient Procedure: 2D Echo, Cardiac Doppler and Color Doppler Indications:    Pulmonary Embolus I26.09  History:        Patient has prior history of Echocardiogram examinations, most  recent 08/06/2022. Pulmonary embolus, Signs/Symptoms:Dyspnea; Risk                 Factors:Hypertension, Dyslipidemia and Non-Smoker.  Sonographer:    Dondra Prader RVT RCS Referring Phys: [id number]   IMPRESSIONS  1. Left ventricular ejection fraction, by estimation, is 70 to 75%. The left ventricle has hyperdynamic function. The left ventricle has no regional wall motion abnormalities. There is moderate left ventricular hypertrophy. Indeterminate diastolic filling due to E-A fusion. There is the interventricular septum is flattened in systole, consistent with right ventricular pressure overload.  2. Right ventricular systolic function is moderately reduced. The right ventricular size is moderately enlarged. There is severely elevated pulmonary artery systolic pressure. The estimated right ventricular systolic pressure is 66.7 mmHg.  3. Right atrial size was severely dilated.  4. The mitral valve is normal in structure. Trivial mitral valve regurgitation. No evidence of mitral stenosis.  5. Tricuspid valve regurgitation is severe.  6. The aortic valve is tricuspid. There is severe calcifcation of the aortic valve. There is moderate thickening of the aortic valve. Aortic valve regurgitation is mild. Mild to moderate aortic valve stenosis. Aortic valve area, by VTI measures 1.51 cm. Aortic valve mean gradient measures 17.5 mmHg. Aortic valve Vmax measures 2.92 m/s.  7. Aortic dilatation noted. There is borderline dilatation of the ascending aorta, measuring 41 mm.  8. The inferior vena cava is dilated in size with >50% respiratory  variability, suggesting right atrial pressure of 8 mmHg. FINDINGS  Left Ventricle: Left ventricular ejection fraction, by estimation, is 70 to 75%. The left ventricle has hyperdynamic function. The left ventricle has no regional wall motion abnormalities. The left ventricular internal cavity size was normal in size. There is moderate left ventricular hypertrophy. The interventricular septum is flattened in systole, consistent with right ventricular pressure overload. Indeterminate diastolic filling due to E-A fusion. Right Ventricle: The right ventricular size is moderately enlarged. Right vetricular wall thickness was not well visualized. Right ventricular systolic function is moderately reduced. There is severely elevated pulmonary artery systolic pressure. The tricuspid regurgitant velocity is 3.83 m/s, and with an assumed right atrial pressure of 8 mmHg, the estimated right ventricular systolic pressure is 66.7 mmHg. Left Atrium: Left atrial size was normal in size. Right Atrium: Right atrial size was severely dilated. Pericardium: There is no evidence of pericardial effusion. Mitral Valve: The mitral valve is normal in structure. Trivial mitral valve regurgitation. No evidence of mitral valve stenosis. Tricuspid Valve: The tricuspid valve is grossly normal. Tricuspid valve regurgitation is severe. Aortic Valve: The aortic valve is tricuspid. There is severe calcifcation of the aortic valve. There is moderate thickening of the aortic valve. Aortic valve regurgitation is mild. Mild to moderate aortic stenosis is present. Aortic valve mean gradient measures 17.5 mmHg. Aortic valve peak gradient measures 34.2 mmHg. Aortic valve area, by VTI measures 1.51 cm. Pulmonic Valve: The pulmonic valve was grossly normal. Pulmonic valve regurgitation is mild. Aorta: Aortic dilatation noted. There is borderline dilatation of the ascending aorta, measuring 41 mm. Venous: The inferior vena cava is dilated in size with greater  than 50% respiratory variability, suggesting right atrial pressure of 8 mmHg. IAS/Shunts: The atrial septum is grossly normal.  LEFT VENTRICLE PLAX 2D LVIDd:         3.70 cm   Diastology LVIDs:         2.77 cm   LV e' medial:    6.83 cm/s LV PW:  1.30 cm   LV E/e' medial:  9.1 LV IVS:        1.40 cm   LV e' lateral:   13.40 cm/s LVOT diam:     2.30 cm   LV E/e' lateral: 4.7 LV SV:         67 LV SV Index:   37 LVOT Area:     4.15 cm  RIGHT VENTRICLE             IVC RV S prime:     12.70 cm/s  IVC diam: 2.30 cm TAPSE (M-mode): 1.9 cm LEFT ATRIUM             Index        RIGHT ATRIUM           Index LA Vol (A2C):   47.4 ml 25.91 ml/m  RA Area:     29.90 cm LA Vol (A4C):   39.5 ml 21.59 ml/m  RA Volume:   120.00 ml 65.60 ml/m LA Biplane Vol: 44.0 ml 24.05 ml/m  AORTIC VALVE                     PULMONIC VALVE AV Area (Vmax):    1.61 cm      PV Vmax:       0.80 m/s AV Area (Vmean):   1.87 cm      PV Peak grad:  2.6 mmHg AV Area (VTI):     1.51 cm AV Vmax:           292.50 cm/s AV Vmean:          161.600 cm/s AV VTI:            0.446 m AV Peak Grad:      34.2 mmHg AV Mean Grad:      17.5 mmHg LVOT Vmax:         113.00 cm/s LVOT Vmean:        72.850 cm/s LVOT VTI:          0.162 m LVOT/AV VTI ratio: 0.36 AR Vena Contracta: 0.40 cm  AORTA Ao Root diam: 3.30 cm Ao Asc diam:  4.10 cm MITRAL VALVE                TRICUSPID VALVE MV Area (PHT): 3.71 cm     TR Peak grad:   58.7 mmHg MV Decel Time: 204 msec     TR Vmax:        383.00 cm/s MV E velocity: 62.33 cm/s MV A velocity: 127.00 cm/s  SHUNTS MV E/A ratio:  0.49         Systemic VTI:  0.16 m                             Systemic Diam: 2.30 cm Weston Brass MD Electronically signed by Weston Brass MD Signature Date/Time: 09/05/2022/11:29:16 PM    Final    VAS [place] LOWER EXTREMITY VENOUS (DVT)  Result Date: 09/05/2022  Lower Venous DVT Study Patient Name:  Derek Blackburn [facility]  Date of Exam:   09/05/2022 Medical Rec #: [id number]            Accession #:     [id number] Date of Birth: 1935-10-22            Patient Gender: M Patient Age:   87 years Exam Location:  [facility] Procedure:      VAS [place]  LOWER EXTREMITY VENOUS (DVT) Referring Phys:   --------------------------------------------------------------------------------  Indications: Pulmonary embolism.  Comparison Study: No prior study on file Performing Technologist: Sherren Kerns RVS  Examination Guidelines: A complete evaluation includes B-mode imaging, spectral Doppler, color Doppler, and power Doppler as needed of all accessible portions of each vessel. Bilateral testing is considered an integral part of a complete examination. Limited examinations for reoccurring indications may be performed as noted. The reflux portion of the exam is performed with the patient in reverse Trendelenburg.  +---------+---------------+---------+-----------+----------+--------------+ RIGHT    CompressibilityPhasicitySpontaneityPropertiesThrombus Aging +---------+---------------+---------+-----------+----------+--------------+ CFV      Full           Yes      Yes                                 +---------+---------------+---------+-----------+----------+--------------+ SFJ      Full                                                        +---------+---------------+---------+-----------+----------+--------------+ FV Prox  Full                                                        +---------+---------------+---------+-----------+----------+--------------+ FV Mid   Full                                                        +---------+---------------+---------+-----------+----------+--------------+ FV DistalFull                                                        +---------+---------------+---------+-----------+----------+--------------+ PFV      Full                                                         +---------+---------------+---------+-----------+----------+--------------+ POP      Full           Yes      Yes                                 +---------+---------------+---------+-----------+----------+--------------+ PTV      None           No       No                                  +---------+---------------+---------+-----------+----------+--------------+ PERO     None           No       No                                  +---------+---------------+---------+-----------+----------+--------------+   +---------+---------------+---------+-----------+----------+-------------------+  LEFT     CompressibilityPhasicitySpontaneityPropertiesThrombus Aging      +---------+---------------+---------+-----------+----------+-------------------+ CFV      Full           Yes      Yes                                      +---------+---------------+---------+-----------+----------+-------------------+ SFJ      Full                                                             +---------+---------------+---------+-----------+----------+-------------------+ FV Prox  Full                                                             +---------+---------------+---------+-----------+----------+-------------------+ FV Mid   Full                                                             +---------+---------------+---------+-----------+----------+-------------------+ FV Distal               Yes      Yes                  patent by color and                                                       Doppler             +---------+---------------+---------+-----------+----------+-------------------+ PFV      Full                                                             +---------+---------------+---------+-----------+----------+-------------------+ POP      Full                                                              +---------+---------------+---------+-----------+----------+-------------------+ PTV      Full                                                             +---------+---------------+---------+-----------+----------+-------------------+ PERO  Not well visualized +---------+---------------+---------+-----------+----------+-------------------+     Summary: BILATERAL: -No evidence of popliteal cyst, bilaterally. RIGHT: - Findings consistent with acute deep vein thrombosis involving the right posterior tibial veins, and right peroneal veins.  LEFT: - No evidence of deep vein thrombosis in the lower extremity. No indirect evidence of obstruction proximal to the inguinal ligament.  *See table(s) above for measurements and observations. Electronically signed by Heath Lark on 09/05/2022 at 4:06:10 PM.    Final    NM Pulmonary Perfusion  Result Date: 09/05/2022 CLINICAL DATA:  Pulmonary embolism (PE) suspected, low to intermediate prob, positive D-dimer EXAM: NUCLEAR MEDICINE PERFUSION LUNG SCAN TECHNIQUE: Perfusion images were obtained in multiple projections after intravenous injection of radiopharmaceutical. Ventilation scans intentionally deferred if perfusion scan and chest x-ray adequate for interpretation during COVID 19 epidemic. RADIOPHARMACEUTICALS:  4.2 mCi Tc-59m MAA IV COMPARISON:  X-ray 09/05/2022 FINDINGS: Multiple large mismatched segmental perfusion defects throughout both lungs. IMPRESSION: High probability for pulmonary embolism. These results will be called to the ordering clinician or representative by the Radiologist Assistant, and communication documented in the PACS or [employer]. Electronically Signed   By: Duanne Guess D.O.   On: 09/05/2022 13:49   DG CHEST PORT 1 VIEW  Result Date: 09/05/2022 CLINICAL DATA:  Shortness of breath.  Pulmonary emboli. EXAM: PORTABLE CHEST 1 VIEW COMPARISON:  Radiographs 09/03/2022 and  08/15/2022. Noncontrast chest CT 09/03/2022. FINDINGS: 0820 hours. Slightly lower lung volumes. The heart size and mediastinal contours are stable with cardiomegaly and aortic tortuosity. The lungs remain clear. No evidence of pneumothorax or significant pleural effusion. No acute osseous findings. Telemetry leads overlie the chest. IMPRESSION: Slightly lower lung volumes. No other significant changes or acute findings identified. Electronically Signed   By: Carey Bullocks M.D.   On: 09/05/2022 09:06      Scheduled Meds:  amLODipine  10 mg Oral Daily   atorvastatin  10 mg Oral Daily   carvedilol  12.5 mg Oral BID WC   darolutamide  300 mg Oral BID WC   donepezil  10 mg Oral Daily   hydrALAZINE  75 mg Oral Q8H   latanoprost  1 drop Both Eyes QHS   leflunomide  20 mg Oral Daily   loratadine  10 mg Oral Daily   sodium bicarbonate  650 mg Oral Daily   timolol  1 drop Both Eyes BID   Continuous Infusions:  heparin 1,050 Units/hr (09/05/22 1637)     LOS: 2 days   Time spent: 25 minutes   Noralee Stain, DO [facility] 09/06/2022, 12:13 PM   Available via Epic secure chat 7am-7pm After these hours, please refer to coverage provider listed on [email or web address]

## 2022-09-07 DIAGNOSIS — N179 Acute kidney failure, unspecified: Secondary | ICD-10-CM | POA: Diagnosis not present

## 2022-09-07 MED ORDER — APIXABAN 5 MG PO TABS: 5.0000 mg | ORAL_TABLET | Freq: Two times a day (BID) | ORAL | Status: DC

## 2022-09-07 MED ORDER — DICLOFENAC SODIUM 1 % EX GEL: 2.0000 g | Freq: Four times a day (QID) | CUTANEOUS | 1 refills | Status: DC

## 2022-09-07 MED ORDER — APIXABAN 5 MG PO TABS
10.0000 mg | ORAL_TABLET | Freq: Two times a day (BID) | ORAL | Status: DC
  Administered 2022-09-07: 10 mg via ORAL
  Filled 2022-09-07: qty 2

## 2022-09-07 MED ORDER — APIXABAN 5 MG PO TABS: ORAL_TABLET | ORAL | 0 refills | Status: DC

## 2022-09-07 NOTE — Progress Notes (Signed)
Patient decided he needed oxygen and a walker to go home with him after Dr Alvino Chapel placed discharge--after a discussion with Dr Alvino Chapel, patient will continue to have shortness of breath/dyspnea with ambulation due to PE in his lungs, and he will not qualify for O2 at home with his sats remaining in 90's even with ambulation---I did request walker with CM/Keisha and patient has been waiting on walker to arrive all afternoon--I tried to find out more info on when walker would be delivered but I have not heard back from CM yet.

## 2022-09-07 NOTE — Progress Notes (Signed)
Patient's walker has been delivered. Instructed him to call wife to pick him up.

## 2022-09-07 NOTE — TOC Transition Note (Signed)
Transition of Care [facility]) - CM/SW Discharge Note   Patient Details  Name: Derek Blackburn MRN: [id number] Date of Birth: 1935/04/07  Transition of Care [facility]) CM/SW Contact:  Ronny Bacon, RN Phone Number: 09/07/2022, 10:11 AM   Clinical Narrative:   Patient is being discharged home today. 30 day free Eliquis card given to floor nurse to give to patient at discharge with instructions for use.    Final next level of care: Home/Self Care Barriers to Discharge: No Barriers Identified   Patient Goals and CMS Choice      Discharge Placement                         Discharge Plan and Services Additional resources added to the After Visit Summary for                                       Social Determinants of Health (SDOH) Interventions SDOH Screenings   Food Insecurity: No Food Insecurity (09/04/2022)  Housing: Low Risk  (09/04/2022)  Transportation Needs: No Transportation Needs (09/04/2022)  Utilities: Not At Risk (09/04/2022)  Tobacco Use: Low Risk  (09/03/2022)     Readmission Risk Interventions     No data to display

## 2022-09-07 NOTE — Progress Notes (Signed)
ANTICOAGULATION CONSULT NOTE  Pharmacy Consult for heparin > apixaban Indication: pulmonary embolus  No Known Allergies  Patient Measurements: Height: 5\' 10"  (177.8 cm) Weight: 66.5 kg (146 lb 9.7 oz) IBW/kg (Calculated) : 73    Vital Signs: Temp: 98.2 F (36.8 C) (08/10 0823) Temp Source: Oral (08/10 0823) BP: 131/90 (08/10 0823) Pulse Rate: 78 (08/10 0823)  Labs: Recent Labs    09/05/22 0239 09/05/22 2344 09/06/22 0626 09/07/22 0029  HGB  --  8.4*  --  8.2*  HCT  --  27.7*  --  26.8*  PLT  --  230  --  211  HEPARINUNFRC  --  0.35 0.48 0.37  CREATININE 2.03* 2.10*  --  1.90*    Estimated Creatinine Clearance: 26.7 mL/min (A) (by C-G formula based on SCr of 1.9 mg/dL (H)).   Medical History: Past Medical History:  Diagnosis Date   Anemia    CKD (chronic kidney disease)    Heart murmur    Hypertension    Hypertensive urgency    Peripheral polyneuropathy    Prostate cancer (HCC)    PVD (peripheral vascular disease) (HCC)    Rheumatoid arthritis involving multiple sites with positive rheumatoid factor (HCC) 08/13/2022    Medications:  Medications Prior to Admission  Medication Sig Dispense Refill Last Dose   acetaminophen (TYLENOL) 650 MG CR tablet Take 650 mg by mouth at bedtime.   09/03/2022   amLODipine (NORVASC) 10 MG tablet Take 1 tablet (10 mg total) by mouth daily. 90 tablet 3 09/03/2022   Apoaequorin (PREVAGEN) 10 MG CAPS Take 10 mg by mouth daily.   09/03/2022   atorvastatin (LIPITOR) 10 MG tablet Take 1 tablet (10 mg total) by mouth daily. 30 tablet 1 09/03/2022   Calcium Carbonate (CALCIUM 500 PO) Take 500 mg by mouth daily.   09/03/2022   carvedilol (COREG) 12.5 MG tablet Take 1 tablet (12.5 mg total) by mouth 2 (two) times daily with a meal. 60 tablet 2 09/03/2022   darolutamide (NUBEQA) 300 MG tablet Take 600 mg by mouth 2 (two) times daily with a meal. This medication treats Prostate Cancer.   09/03/2022   donepezil (ARICEPT) 10 MG tablet Take 1 tablet (10  mg total) by mouth daily. 30 tablet 11 09/03/2022   hydrALAZINE (APRESOLINE) 25 MG tablet Take 3 tablets (75 mg total) by mouth 3 (three) times daily. 90 tablet 1 09/03/2022   JARDIANCE 10 MG TABS tablet Take 10 mg by mouth daily.      latanoprost (XALATAN) 0.005 % ophthalmic solution Place 1 drop into both eyes at bedtime.   09/02/2022   leflunomide (ARAVA) 20 MG tablet Take 20 mg by mouth daily.   09/03/2022   loratadine (CLARITIN) 10 MG tablet Take 10 mg by mouth daily.   09/02/2022   Multiple Vitamin (MULITIVITAMIN WITH MINERALS) TABS Take 1 tablet by mouth daily.   09/03/2022   Omega-3 Fatty Acids (FISH OIL) 1200 MG CAPS Take 1,200 mg by mouth daily.   09/03/2022   Potassium 99 MG TABS Take 99 mg by mouth daily.   09/03/2022   timolol (TIMOPTIC) 0.5 % ophthalmic solution Place 1 drop into both eyes 2 (two) times daily.   09/03/2022   trandolapril (MAVIK) 4 MG tablet Take 4 mg by mouth daily.   09/03/2022   vitamin B-12 (CYANOCOBALAMIN) 100 MCG tablet Take 100 mcg by mouth daily.   09/03/2022   VITAMIN D, CHOLECALCIFEROL, PO Take 1 tablet by mouth daily.   09/03/2022  Scheduled:   amLODipine  10 mg Oral Daily   apixaban  10 mg Oral BID   Followed by   Melene Muller ON 09/14/2022] apixaban  5 mg Oral BID   atorvastatin  10 mg Oral Daily   carvedilol  12.5 mg Oral BID WC   darolutamide  300 mg Oral BID WC   donepezil  10 mg Oral Daily   hydrALAZINE  75 mg Oral Q8H   latanoprost  1 drop Both Eyes QHS   leflunomide  20 mg Oral Daily   loratadine  10 mg Oral Daily   sodium bicarbonate  650 mg Oral Daily   timolol  1 drop Both Eyes BID    Assessment: 87 yo male with VQ showing high probability of PE and venous dopler with R LLE DVT . He is noted with history of prostate cancer. Pharmacy consulted to dose heparin -hg= 8/4 -no anticoagulants noted PTA Heparin drip 1050 uts/hr with heparin level 0.37 at goal  Cbc stable no bleeding  Plan to transition to oral anticoagulation  Goal of Therapy:  Heparin level  0.3-0.7 units/ml Monitor platelets by anticoagulation protocol: Yes   Plan:  Stop heparin Start apixaban 10mg  BID x7 days then 5mg  BID  (apixaban= $47)   Leota Sauers Pharm.D. CPP, BCPS Clinical Pharmacist [phone] 09/07/2022 8:59 AM   **Pharmacist phone directory can now be found on [email or web address] ([email or web address]).  Listed under [facility].

## 2022-09-07 NOTE — Discharge Summary (Addendum)
Physician Discharge Summary  Plano Schnarr ZHY:[id number] DOB: Apr 07, 1935 DOA: 09/03/2022  PCP: Diamantina Providence, FNP  Admit date: 09/03/2022 Discharge date: 09/07/2022  Admitted From: Home Disposition:  Home  Recommendations for Outpatient Follow-up:  Follow up with PCP in 1 week Follow up with Nephrology   Discharge Condition: Stable CODE STATUS: Full  Diet recommendation: Heart healthy   Brief/Interim Summary: Derek Blackburn is an 87 year old male with past medical history significant for CKD, hypertension.  He had a hospitalization in 7/9 for dyspnea on exertion, had AKI which improved with hydration.  He was readmitted 7/16 for shortness of breath and hypertensive urgency.  At that time, patient underwent extensive cardiac workup including echocardiogram, heart cath, was largely unremarkable for severe cardiac disease.  He now returns for continued dyspnea on exertion. He was found to have AKI and PE/DVT RLE. He was started on IV heparin and transitioned to Eliquis for discharge.   Discharge Diagnoses:   Principal Problem:   AKI (acute kidney injury) (HCC) Active Problems:   Dyspnea on exertion   Malignant neoplasm of prostate (HCC)   History of anemia due to CKD   Chronic kidney disease, stage 3 unspecified (HCC)   Essential hypertension   Acute pulmonary embolism (HCC)   DVT (deep venous thrombosis) (HCC)   AKI on CKD stage IIIb -Baseline creatinine 1.8 -Renal ultrasound showed large bilateral simple renal cysts as well as heterogenous structure within the urinary bladder -Patient followed by Dr. Thedore Mins at [facility] -Cr 1.9 on discharge    PE and DVT, provoked in setting of roadtrip  -VQ scan showed high probability of pulmonary embolism.  Was unable to do CTA due to his kidney dysfunction -This explains his dyspnea on exertion -DVT ultrasound +for DVT on right  -Echocardiogram showed RV pressure overload, pulmonary HTN  -IV heparin -->  Eliquis -This is his second VTE, will need lifelong anticoagulation unless develops contraindication    Non-anion gap metabolic acidosis -Follow up with Nephrology    Anemia of chronic disease -Hemoglobin stable 8.2   Prostate cancer -Followed by Dr. Mena Goes Merleen Nicely    Hypertension -Amlodipine, Coreg, hydralazine -Stable BP   Discharge Instructions  Discharge Instructions     Call MD for:  difficulty breathing, headache or visual disturbances   Complete by: As directed    Call MD for:  extreme fatigue   Complete by: As directed    Call MD for:  persistant dizziness or light-headedness   Complete by: As directed    Call MD for:  persistant nausea and vomiting   Complete by: As directed    Call MD for:  severe uncontrolled pain   Complete by: As directed    Call MD for:  temperature >100.4   Complete by: As directed    Diet - low sodium heart healthy   Complete by: As directed    Discharge instructions   Complete by: As directed    You were cared for by a hospitalist during your hospital stay. If you have any questions about your discharge medications or the care you received while you were in the hospital after you are discharged, you can call the unit and ask to speak with the hospitalist on call if the hospitalist that took care of you is not available. Once you are discharged, your primary care physician will handle any further medical issues. Please note that NO REFILLS for any discharge medications will be authorized once you are discharged, as  it is imperative that you return to your primary care physician (or establish a relationship with a primary care physician if you do not have one) for your aftercare needs so that they can reassess your need for medications and monitor your lab values.   Increase activity slowly   Complete by: As directed       Allergies as of 09/07/2022   No Known Allergies      Medication List     STOP taking these medications     Jardiance 10 MG Tabs tablet Generic drug: empagliflozin   Potassium 99 MG Tabs   trandolapril 4 MG tablet Commonly known as: MAVIK       TAKE these medications    acetaminophen 650 MG CR tablet Commonly known as: TYLENOL Take 650 mg by mouth at bedtime.   amLODipine 10 MG tablet Commonly known as: NORVASC Take 1 tablet (10 mg total) by mouth daily.   apixaban 5 MG Tabs tablet Commonly known as: ELIQUIS Take 2 tablets (10 mg total) by mouth 2 (two) times daily for 7 days, THEN 1 tablet (5 mg total) 2 (two) times daily. Start taking on: September 07, 2022   atorvastatin 10 MG tablet Commonly known as: LIPITOR Take 1 tablet (10 mg total) by mouth daily.   CALCIUM 500 PO Take 500 mg by mouth daily.   carvedilol 12.5 MG tablet Commonly known as: COREG Take 1 tablet (12.5 mg total) by mouth 2 (two) times daily with a meal.   darolutamide 300 MG tablet Commonly known as: NUBEQA Take 600 mg by mouth 2 (two) times daily with a meal. This medication treats Prostate Cancer.   diclofenac Sodium 1 % Gel Commonly known as: Voltaren Apply 2 g topically 4 (four) times daily.   donepezil 10 MG tablet Commonly known as: ARICEPT Take 1 tablet (10 mg total) by mouth daily.   Fish Oil 1200 MG Caps Take 1,200 mg by mouth daily.   hydrALAZINE 25 MG tablet Commonly known as: APRESOLINE Take 3 tablets (75 mg total) by mouth 3 (three) times daily.   latanoprost 0.005 % ophthalmic solution Commonly known as: XALATAN Place 1 drop into both eyes at bedtime.   leflunomide 20 MG tablet Commonly known as: ARAVA Take 20 mg by mouth daily.   loratadine 10 MG tablet Commonly known as: CLARITIN Take 10 mg by mouth daily.   multivitamin with minerals Tabs tablet Take 1 tablet by mouth daily.   Prevagen 10 MG Caps Generic drug: Apoaequorin Take 10 mg by mouth daily.   timolol 0.5 % ophthalmic solution Commonly known as: TIMOPTIC Place 1 drop into both eyes 2 (two) times  daily.   vitamin B-12 100 MCG tablet Commonly known as: CYANOCOBALAMIN Take 100 mcg by mouth daily.   VITAMIN D (CHOLECALCIFEROL) PO Take 1 tablet by mouth daily.        Follow-up Information     Diamantina Providence, FNP Follow up.   Specialty: Nurse Practitioner Contact information: [address] [place] [address] [phone]         Anthony Sar, MD Follow up.   Specialty: Nephrology Contact information: [address] [place] [address] [phone]         Jerilee Field, MD Follow up.   Specialty: Urology Contact information: [address] [place] [address] [phone]                No Known Allergies  Consultations: None  Procedures/Studies: ECHOCARDIOGRAM COMPLETE  Result Date: 09/05/2022    ECHOCARDIOGRAM REPORT   Patient Name:   [facility] Date of Exam: 09/05/2022 Medical Rec #:  [id number]           Height:       70.0 in Accession #:    [id number]          Weight:       146.6 lb Date of Birth:  [birth year]           BSA:          1.829 m Patient Age:    87 years            BP:           118/74 mmHg Patient Gender: M                   HR:           90 bpm. Exam Location:  Inpatient Procedure: 2D Echo, Cardiac Doppler and Color Doppler Indications:    Pulmonary Embolus I26.09  History:        Patient has prior history of Echocardiogram examinations, most                 recent 08/06/2022. Pulmonary embolus, Signs/Symptoms:Dyspnea; Risk                 Factors:Hypertension, Dyslipidemia and Non-Smoker.  Sonographer:    Dondra Prader RVT RCS Referring Phys: [id number]   IMPRESSIONS  1. Left ventricular ejection fraction, by estimation, is 70 to 75%. The left ventricle has hyperdynamic function. The left ventricle has no regional wall motion abnormalities. There is moderate left ventricular hypertrophy. Indeterminate diastolic filling due to E-A fusion. There is the interventricular septum is flattened in systole,  consistent with right ventricular pressure overload.  2. Right ventricular systolic function is moderately reduced. The right ventricular size is moderately enlarged. There is severely elevated pulmonary artery systolic pressure. The estimated right ventricular systolic pressure is 66.7 mmHg.  3. Right atrial size was severely dilated.  4. The mitral valve is normal in structure. Trivial mitral valve regurgitation. No evidence of mitral stenosis.  5. Tricuspid valve regurgitation is severe.  6. The aortic valve is tricuspid. There is severe calcifcation of the aortic valve. There is moderate thickening of the aortic valve. Aortic valve regurgitation is mild. Mild to moderate aortic valve stenosis. Aortic valve area, by VTI measures 1.51 cm. Aortic valve mean gradient measures 17.5 mmHg. Aortic valve Vmax measures 2.92 m/s.  7. Aortic dilatation noted. There is borderline dilatation of the ascending aorta, measuring 41 mm.  8. The inferior vena cava is dilated in size with >50% respiratory variability, suggesting right atrial pressure of 8 mmHg. FINDINGS  Left Ventricle: Left ventricular ejection fraction, by estimation, is 70 to 75%. The left ventricle has hyperdynamic function. The left ventricle has no regional wall motion abnormalities. The left ventricular internal cavity size was normal in size. There is moderate left ventricular hypertrophy. The interventricular septum is flattened in systole, consistent with right ventricular pressure overload. Indeterminate diastolic filling due to E-A fusion. Right Ventricle: The right ventricular size is moderately enlarged. Right vetricular wall thickness was not well visualized. Right ventricular systolic function is moderately reduced. There is severely elevated pulmonary artery systolic pressure. The tricuspid regurgitant velocity is 3.83 m/s, and with an assumed right atrial pressure of 8 mmHg, the estimated right ventricular systolic pressure is 66.7 mmHg. Left  Atrium: Left atrial  size was normal in size. Right Atrium: Right atrial size was severely dilated. Pericardium: There is no evidence of pericardial effusion. Mitral Valve: The mitral valve is normal in structure. Trivial mitral valve regurgitation. No evidence of mitral valve stenosis. Tricuspid Valve: The tricuspid valve is grossly normal. Tricuspid valve regurgitation is severe. Aortic Valve: The aortic valve is tricuspid. There is severe calcifcation of the aortic valve. There is moderate thickening of the aortic valve. Aortic valve regurgitation is mild. Mild to moderate aortic stenosis is present. Aortic valve mean gradient measures 17.5 mmHg. Aortic valve peak gradient measures 34.2 mmHg. Aortic valve area, by VTI measures 1.51 cm. Pulmonic Valve: The pulmonic valve was grossly normal. Pulmonic valve regurgitation is mild. Aorta: Aortic dilatation noted. There is borderline dilatation of the ascending aorta, measuring 41 mm. Venous: The inferior vena cava is dilated in size with greater than 50% respiratory variability, suggesting right atrial pressure of 8 mmHg. IAS/Shunts: The atrial septum is grossly normal.  LEFT VENTRICLE PLAX 2D LVIDd:         3.70 cm   Diastology LVIDs:         2.77 cm   LV e' medial:    6.83 cm/s LV PW:         1.30 cm   LV E/e' medial:  9.1 LV IVS:        1.40 cm   LV e' lateral:   13.40 cm/s LVOT diam:     2.30 cm   LV E/e' lateral: 4.7 LV SV:         67 LV SV Index:   37 LVOT Area:     4.15 cm  RIGHT VENTRICLE             IVC RV S prime:     12.70 cm/s  IVC diam: 2.30 cm TAPSE (M-mode): 1.9 cm LEFT ATRIUM             Index        RIGHT ATRIUM           Index LA Vol (A2C):   47.4 ml 25.91 ml/m  RA Area:     29.90 cm LA Vol (A4C):   39.5 ml 21.59 ml/m  RA Volume:   120.00 ml 65.60 ml/m LA Biplane Vol: 44.0 ml 24.05 ml/m  AORTIC VALVE                     PULMONIC VALVE AV Area (Vmax):    1.61 cm      PV Vmax:       0.80 m/s AV Area (Vmean):   1.87 cm      PV Peak grad:  2.6  mmHg AV Area (VTI):     1.51 cm AV Vmax:           292.50 cm/s AV Vmean:          161.600 cm/s AV VTI:            0.446 m AV Peak Grad:      34.2 mmHg AV Mean Grad:      17.5 mmHg LVOT Vmax:         113.00 cm/s LVOT Vmean:        72.850 cm/s LVOT VTI:          0.162 m LVOT/AV VTI ratio: 0.36 AR Vena Contracta: 0.40 cm  AORTA Ao Root diam: 3.30 cm Ao Asc diam:  4.10 cm MITRAL VALVE  TRICUSPID VALVE MV Area (PHT): 3.71 cm     TR Peak grad:   58.7 mmHg MV Decel Time: 204 msec     TR Vmax:        383.00 cm/s MV E velocity: 62.33 cm/s MV A velocity: 127.00 cm/s  SHUNTS MV E/A ratio:  0.49         Systemic VTI:  0.16 m                             Systemic Diam: 2.30 cm Weston Brass MD Electronically signed by Weston Brass MD Signature Date/Time: 09/05/2022/11:29:16 PM    Final    VAS [place] LOWER EXTREMITY VENOUS (DVT)  Result Date: 09/05/2022  Lower Venous DVT Study Patient Name:  TUF LATONA [facility]  Date of Exam:   09/05/2022 Medical Rec #: [id number]            Accession #:    [id number] Date of Birth: 31-May-1935            Patient Gender: M Patient Age:   53 years [facility]am [facility] Procedure:      VAS [place] LOWER EXTREMITY VENOUS (DVT) Referring Phys: Victorino Dike  --------------------------------------------------------------------------------  Indications: Pulmonary embolism.  Comparison Study: No prior study on file Performing Technologist: Sherren Kerns RVS  Examination Guidelines: A complete evaluation includes B-mode imaging, spectral Doppler, color Doppler, and power Doppler as needed of all accessible portions of each vessel. Bilateral testing is considered an integral part of a complete examination. Limited examinations for reoccurring indications may be performed as noted. The reflux portion of the exam is performed with the patient in reverse Trendelenburg.  +---------+---------------+---------+-----------+----------+--------------+ RIGHT     CompressibilityPhasicitySpontaneityPropertiesThrombus Aging +---------+---------------+---------+-----------+----------+--------------+ CFV      Full           Yes      Yes                                 +---------+---------------+---------+-----------+----------+--------------+ SFJ      Full                                                        +---------+---------------+---------+-----------+----------+--------------+ FV Prox  Full                                                        +---------+---------------+---------+-----------+----------+--------------+ FV Mid   Full                                                        +---------+---------------+---------+-----------+----------+--------------+ FV DistalFull                                                        +---------+---------------+---------+-----------+----------+--------------+  PFV      Full                                                        +---------+---------------+---------+-----------+----------+--------------+ POP      Full           Yes      Yes                                 +---------+---------------+---------+-----------+----------+--------------+ PTV      None           No       No                                  +---------+---------------+---------+-----------+----------+--------------+ PERO     None           No       No                                  +---------+---------------+---------+-----------+----------+--------------+   +---------+---------------+---------+-----------+----------+-------------------+ LEFT     CompressibilityPhasicitySpontaneityPropertiesThrombus Aging      +---------+---------------+---------+-----------+----------+-------------------+ CFV      Full           Yes      Yes                                      +---------+---------------+---------+-----------+----------+-------------------+ SFJ      Full                                                              +---------+---------------+---------+-----------+----------+-------------------+ FV Prox  Full                                                             +---------+---------------+---------+-----------+----------+-------------------+ FV Mid   Full                                                             +---------+---------------+---------+-----------+----------+-------------------+ FV Distal               Yes      Yes                  patent by color and  Doppler             +---------+---------------+---------+-----------+----------+-------------------+ PFV      Full                                                             +---------+---------------+---------+-----------+----------+-------------------+ POP      Full                                                             +---------+---------------+---------+-----------+----------+-------------------+ PTV      Full                                                             +---------+---------------+---------+-----------+----------+-------------------+ PERO                                                  Not well visualized +---------+---------------+---------+-----------+----------+-------------------+     Summary: BILATERAL: -No evidence of popliteal cyst, bilaterally. RIGHT: - Findings consistent with acute deep vein thrombosis involving the right posterior tibial veins, and right peroneal veins.  LEFT: - No evidence of deep vein thrombosis in the lower extremity. No indirect evidence of obstruction proximal to the inguinal ligament.  *See table(s) above for measurements and observations. Electronically signed by Heath Lark on 09/05/2022 at 4:06:10 PM.    Final    NM Pulmonary Perfusion  Result Date: 09/05/2022 CLINICAL DATA:  Pulmonary embolism (PE) suspected, low to intermediate prob,  positive D-dimer EXAM: NUCLEAR MEDICINE PERFUSION LUNG SCAN TECHNIQUE: Perfusion images were obtained in multiple projections after intravenous injection of radiopharmaceutical. Ventilation scans intentionally deferred if perfusion scan and chest x-ray adequate for interpretation during COVID 19 epidemic. RADIOPHARMACEUTICALS:  4.2 mCi Tc-16m MAA IV COMPARISON:  X-ray 09/05/2022 FINDINGS: Multiple large mismatched segmental perfusion defects throughout both lungs. IMPRESSION: High probability for pulmonary embolism. These results will be called to the ordering clinician or representative by the Radiologist Assistant, and communication documented in the PACS or [employer]. Electronically Signed   By: Duanne Guess D.O.   On: 09/05/2022 13:49   DG CHEST PORT 1 VIEW  Result Date: 09/05/2022 CLINICAL DATA:  Shortness of breath.  Pulmonary emboli. EXAM: PORTABLE CHEST 1 VIEW COMPARISON:  Radiographs 09/03/2022 and 08/15/2022. Noncontrast chest CT 09/03/2022. FINDINGS: 0820 hours. Slightly lower lung volumes. The heart size and mediastinal contours are stable with cardiomegaly and aortic tortuosity. The lungs remain clear. No evidence of pneumothorax or significant pleural effusion. No acute osseous findings. Telemetry leads overlie the chest. IMPRESSION: Slightly lower lung volumes. No other significant changes or acute findings identified. Electronically Signed   By: Carey Bullocks M.D.   On: 09/05/2022 09:06   US RENAL  Result Date: 09/04/2022 CLINICAL DATA:  Acute kidney injury. EXAM: RENAL / URINARY TRACT ULTRASOUND COMPLETE COMPARISON:  September 08, 2020 FINDINGS: Right Kidney: Renal measurements: 11.3 cm x 5.0 cm x 5.0 cm = volume: 146.9 mL. Echogenicity within normal limits. 4.9 cm x 4.4 cm x 5.2 cm and 4.7 cm x 4.9 cm x 5.0 cm simple right renal cysts are seen. No hydronephrosis is visualized. Left Kidney: Renal measurements: 11.4 cm x 6.7 cm x 4.7 cm = volume: 186.8 mL. Echogenicity within normal  limits. 3.0 cm x 2.3 cm x 2.7 cm and 3.1 cm x 2.7 cm x 2.8 cm simple left renal cysts are seen. No hydronephrosis is visualized. Bladder: A 1.8 cm x 1.6 cm x 3.3 cm heterogeneous echogenic structure is seen along the nondependent wall of the urinary bladder lumen. A similar appearing area is seen on the prior study. No abnormal flow is seen within this region on color Doppler evaluation. Other: None. IMPRESSION: 1. Large bilateral simple renal cysts. 2. Heterogeneous structure within the urinary bladder lumen which may represent sequelae associated with an enlarged prostate gland. A urinary bladder mass cannot completely be excluded. Urology consult is recommended. Electronically Signed   By: Aram Candela M.D.   On: 09/04/2022 03:14   CT Chest Wo Contrast  Result Date: 09/03/2022 CLINICAL DATA:  Shortness of breath EXAM: CT CHEST WITHOUT CONTRAST TECHNIQUE: Multidetector CT imaging of the chest was performed following the standard protocol without IV contrast. RADIATION DOSE REDUCTION: This exam was performed according to the departmental dose-optimization program which includes automated exposure control, adjustment of the mA and/or kV according to patient size and/or use of iterative reconstruction technique. COMPARISON:  None Available. FINDINGS: Cardiovascular: Cardiomegaly. No pericardial effusion. Normal caliber thoracic aorta moderate calcified plaque. Mildly dilated ascending thoracic aorta measuring up to 4.1 cm. Dilated main pulmonary artery, measuring up to 4.2 cm. Severe aortic valve calcifications. Mild coronary artery calcifications. Mediastinum/Nodes: Esophagus unremarkable. Enlarged and heterogeneous thyroid. Calcified mediastinal and hilar lymph nodes, likely sequela of prior granulomatous infection. No enlarged lymph nodes seen in the chest. Lungs/Pleura: Central airways are patent. Mild focal areas of bronchiectasis seen in the right lower lobe posterior right upper lobe with associated  mild interstitial thickening and architectural distortion, likely sequela of prior infection or recurrent aspiration. No consolidation, pleural effusion or pneumothorax. Upper Abdomen: Bilateral nonobstructing renal stones. Multiple cystic lesions of the left kidney, likely simple cysts, no specific follow-up imaging is necessary. Musculoskeletal: No chest wall mass or suspicious bone lesions identified. IMPRESSION: 1. Mild focal areas of bronchiectasis seen in the right lower lobe and posterior right upper lobe with associated mild interstitial thickening and architectural distortion, likely sequela of prior infection or recurrent aspiration. 2. Dilated main pulmonary artery, which can be seen in the setting of pulmonary hypertension. 3. Severe aortic valve calcifications, findings can be seen in the setting of aortic stenosis. Correlate with echocardiography. 4. Mildly dilated ascending thoracic aorta measuring up to 4.1 cm. Recommend annual imaging followup by CTA or MRA. This recommendation follows 2010 ACCF/AHA/AATS/ACR/ASA/SCA/SCAI/SIR/STS/SVM Guidelines for the Diagnosis and Management of Patients with Thoracic Aortic Disease. Circulation. 2010; 121: Z610-R604. Aortic aneurysm NOS (ICD10-I71.9) 5. Enlarged and heterogeneous thyroid. Recommend non-emergent thyroid ultrasound if clinically warranted given patient age. 6. Cardiomegaly and aortic Atherosclerosis (ICD10-I70.0). Electronically Signed   By: Allegra Lai M.D.   On: 09/03/2022 18:49   DG Chest 2 View  Result Date: 09/03/2022 CLINICAL DATA:  Shortness of breath for 6 months.  Back pain. EXAM: CHEST - 2 VIEW COMPARISON:  Radiographs 08/15/2022, 08/13/2022 and 07/20/2021. FINDINGS: The heart size and mediastinal  contours are stable with cardiomegaly and aortic tortuosity. The trachea remains midline. The lungs are clear. There is no pleural effusion or pneumothorax. Stable mild degenerative changes throughout the spine without evidence of acute  osseous abnormality. Telemetry leads overlie the chest. IMPRESSION: Stable cardiomegaly. No evidence of acute cardiopulmonary process. Electronically Signed   By: Carey Bullocks M.D.   On: 09/03/2022 16:28   DG CHEST PORT 1 VIEW  Result Date: 08/15/2022 CLINICAL DATA:  Dyspnea on exertion EXAM: PORTABLE CHEST 1 VIEW COMPARISON:  CXR 08/13/22 FINDINGS: No pleural effusion. No pneumothorax. Borderline cardiomegaly. No focal airspace opacity. No radiographically apparent displaced rib fractures. Visualized upper abdomen is unremarkable. IMPRESSION: No focal airspace opacity.  Cardiomegaly. Electronically Signed   By: Lorenza Cambridge M.D.   On: 08/15/2022 15:40   DG Chest 2 View  Result Date: 08/13/2022 CLINICAL DATA:  Shortness of breath EXAM: CHEST - 2 VIEW COMPARISON:  08/06/2022 FINDINGS: Cardiomegaly. No acute airspace disease, pleural effusion or pneumothorax. IMPRESSION: Cardiomegaly. Electronically Signed   By: Jasmine Pang M.D.   On: 08/13/2022 15:45       Discharge Exam: Vitals:   09/07/22 0807 09/07/22 0823  BP: 129/82 (!) 131/90  Pulse: 82 78  Resp:  19  Temp:  98.2 F (36.8 C)  SpO2:  95%    General: Pt is alert, awake, not in acute distress Cardiovascular: RRR, S1/S2 +, no edema Respiratory: CTA bilaterally, no wheezing, no rhonchi, no respiratory distress, no conversational dyspnea, on room air  Abdominal: Soft, NT, ND, bowel sounds + Extremities: no edema, no cyanosis Psych: Normal mood and affect, stable judgement and insight     The results of significant diagnostics from this hospitalization (including imaging, microbiology, ancillary and laboratory) are listed below for reference.     Microbiology: Recent Results (from the past 240 hour(s))  SARS Coronavirus 2 by RT PCR (hospital order, performed in [facility] hospital lab) *cepheid single result test* Anterior Nasal Swab     Status: None   Collection Time: 09/04/22  1:22 AM   Specimen: Anterior Nasal Swab   Result Value Ref Range Status   SARS Coronavirus 2 by RT PCR NEGATIVE NEGATIVE Final    Comment: Performed at [facility], [address] [place], [place] [address]     Labs: BNP (last 3 results) Recent Labs    08/06/22 0900 08/13/22 1643 09/03/22 1519  BNP 1,601.5* 1,367.1* 1,767.1*   Basic Metabolic Panel: Recent Labs  Lab 09/03/22 1519 09/03/22 2310 09/04/22 0300 09/05/22 0239 09/05/22 2344 09/07/22 0029  NA 143 145 143 139 136 135  K 4.3 4.3 4.0 4.5 3.9 3.9  CL 107  --  109 112* 107 107  CO2 20*  --  19* 18* 18* 18*  GLUCOSE 136*  --  147* 94 114* 113*  BUN 50*  --  47* 38* 37* 36*  CREATININE 3.06*  --  2.63* 2.03* 2.10* 1.90*  CALCIUM 9.6  --  9.2 8.8* 8.6* 8.5*   Liver Function Tests: No results for input(s): "AST", "ALT", "ALKPHOS", "BILITOT", "PROT", "ALBUMIN" in the last 168 hours. No results for input(s): "LIPASE", "AMYLASE" in the last 168 hours. No results for input(s): "AMMONIA" in the last 168 hours. CBC: Recent Labs  Lab 09/03/22 1519 09/03/22 2310 09/04/22 0300 09/05/22 2344 09/07/22 0029  WBC 8.2  --  9.3 7.8 7.1  HGB 9.6* 8.8* 9.1* 8.4* 8.2*  HCT 32.0* 26.0* 30.3* 27.7* 26.8*  MCV 97.9  --  96.5  94.9 96.8  PLT 252  --  231 230 211   Cardiac Enzymes: No results for input(s): "CKTOTAL", "CKMB", "CKMBINDEX", "TROPONINI" in the last 168 hours. BNP: Invalid input(s): "POCBNP" CBG: No results for input(s): "GLUCAP" in the last 168 hours. D-Dimer No results for input(s): "DDIMER" in the last 72 hours. Hgb A1c No results for input(s): "HGBA1C" in the last 72 hours. Lipid Profile No results for input(s): "CHOL", "HDL", "LDLCALC", "TRIG", "CHOLHDL", "LDLDIRECT" in the last 72 hours. Thyroid function studies No results for input(s): "TSH", "T4TOTAL", "T3FREE", "THYROIDAB" in the last 72 hours.  Invalid input(s): "FREET3" Anemia work up No results for input(s): "VITAMINB12", "FOLATE", "FERRITIN", "TIBC", "IRON", "RETICCTPCT" in the  last 72 hours. Urinalysis    Component Value Date/Time   COLORURINE YELLOW 08/13/2022 1734   APPEARANCEUR CLEAR 08/13/2022 1734   LABSPEC 1.014 08/13/2022 1734   PHURINE 5.0 08/13/2022 1734   GLUCOSEU 150 (A) 08/13/2022 1734   HGBUR NEGATIVE 08/13/2022 1734   BILIRUBINUR NEGATIVE 08/13/2022 1734   KETONESUR NEGATIVE 08/13/2022 1734   PROTEINUR 100 (A) 08/13/2022 1734   UROBILINOGEN 1.0 01/21/2011 1720   NITRITE NEGATIVE 08/13/2022 1734   LEUKOCYTESUR NEGATIVE 08/13/2022 1734   Sepsis Labs Recent Labs  Lab 09/03/22 1519 09/04/22 0300 09/05/22 2344 09/07/22 0029  WBC 8.2 9.3 7.8 7.1   Microbiology Recent Results (from the past 240 hour(s))  SARS Coronavirus 2 by RT PCR (hospital order, performed in [facility] hospital lab) *cepheid single result test* Anterior Nasal Swab     Status: None   Collection Time: 09/04/22  1:22 AM   Specimen: Anterior Nasal Swab  Result Value Ref Range Status   SARS Coronavirus 2 by RT PCR NEGATIVE NEGATIVE Final    Comment: Performed at [facility], [address], [place], [place] [address]     Patient was seen and examined on the day of discharge and was found to be in stable condition. Time coordinating discharge: 25 minutes including assessment and coordination of care, as well as examination of the patient.   SIGNED:  Noralee Stain, DO [facility] 09/07/2022, 10:48 AM

## 2022-09-07 NOTE — Progress Notes (Signed)
Patient discharged home with wife. Unit secretary transported patient by wheelchair to the vehicle of his spouse. He has all belongings and walker.

## 2022-09-11 ENCOUNTER — Encounter (HOSPITAL_COMMUNITY)
Admission: RE | Admit: 2022-09-11 | Discharge: 2022-09-11 | Disposition: A | Discharge status: Home / Self Care | Payer: Medicare Other | Source: Ambulatory Visit | Attending: Nephrology | Admitting: Nephrology

## 2022-09-11 VITALS — BP 111/90 | HR 85 | Temp 97.2°F | Resp 18

## 2022-09-11 DIAGNOSIS — N189 Chronic kidney disease, unspecified: Secondary | ICD-10-CM | POA: Insufficient documentation

## 2022-09-11 DIAGNOSIS — Z862 Personal history of diseases of the blood and blood-forming organs and certain disorders involving the immune mechanism: Secondary | ICD-10-CM | POA: Insufficient documentation

## 2022-09-11 LAB — IRON AND TIBC
Iron: 50 ug/dL (ref 45–182)
Saturation Ratios: 24 % (ref 17.9–39.5)
TIBC: 207 ug/dL — ABNORMAL LOW (ref 250–450)
UIBC: 157 ug/dL

## 2022-09-11 LAB — FERRITIN: Ferritin: 311 ng/mL (ref 24–336)

## 2022-09-11 LAB — POCT HEMOGLOBIN-HEMACUE: Hemoglobin: 8.5 g/dL — ABNORMAL LOW (ref 13.0–17.0)

## 2022-09-11 MED ORDER — DARBEPOETIN ALFA 40 MCG/0.4ML IJ SOSY
40.0000 ug | PREFILLED_SYRINGE | INTRAMUSCULAR | Status: DC
  Administered 2022-09-11: 40 ug via SUBCUTANEOUS

## 2022-09-11 MED ORDER — DARBEPOETIN ALFA 40 MCG/0.4ML IJ SOSY
PREFILLED_SYRINGE | INTRAMUSCULAR | Status: AC
  Filled 2022-09-11: qty 0.4

## 2022-09-25 ENCOUNTER — Encounter (HOSPITAL_COMMUNITY)
Admission: RE | Admit: 2022-09-25 | Discharge: 2022-09-25 | Disposition: A | Discharge status: Home / Self Care | Payer: Medicare Other | Source: Ambulatory Visit | Attending: Nephrology | Admitting: Nephrology

## 2022-09-25 VITALS — BP 118/76 | HR 76 | Temp 97.6°F | Resp 17

## 2022-09-25 DIAGNOSIS — N189 Chronic kidney disease, unspecified: Secondary | ICD-10-CM

## 2022-09-25 LAB — POCT HEMOGLOBIN-HEMACUE: Hemoglobin: 9.1 g/dL — ABNORMAL LOW (ref 13.0–17.0)

## 2022-09-25 MED ORDER — DARBEPOETIN ALFA 40 MCG/0.4ML IJ SOSY
PREFILLED_SYRINGE | INTRAMUSCULAR | Status: AC
  Administered 2022-09-25: 40 ug via SUBCUTANEOUS
  Filled 2022-09-25: qty 0.4

## 2022-09-25 MED ORDER — DARBEPOETIN ALFA 40 MCG/0.4ML IJ SOSY: 40.0000 ug | PREFILLED_SYRINGE | INTRAMUSCULAR | Status: DC

## 2022-10-08 ENCOUNTER — Encounter: Payer: Self-pay | Admitting: Pulmonary Disease

## 2022-10-08 ENCOUNTER — Ambulatory Visit: Payer: Medicare Other | Admitting: Pulmonary Disease

## 2022-10-08 VITALS — BP 134/70 | HR 73 | Temp 98.2°F | Ht 70.0 in | Wt 147.4 lb

## 2022-10-08 DIAGNOSIS — I2699 Other pulmonary embolism without acute cor pulmonale: Secondary | ICD-10-CM

## 2022-10-08 DIAGNOSIS — I2729 Other secondary pulmonary hypertension: Secondary | ICD-10-CM

## 2022-10-08 DIAGNOSIS — R0609 Other forms of dyspnea: Secondary | ICD-10-CM

## 2022-10-08 MED ORDER — STIOLTO RESPIMAT 2.5-2.5 MCG/ACT IN AERS: 2.0000 | INHALATION_SPRAY | Freq: Every day | RESPIRATORY_TRACT | 11 refills | Status: DC

## 2022-10-08 NOTE — Patient Instructions (Addendum)
Try Stiolto 2 puff once a day - see if this helps with shortness of breath  We will get pulmonary function tests scheduled  We will do home sleep test to see if you have sleep apnea  We will repeat a heart ultrasound (echocardiogram) for November to see if is improved with treatment of blood clot   Return to clinic in 2 months or sooner as needed

## 2022-10-09 ENCOUNTER — Encounter (HOSPITAL_COMMUNITY)
Admission: RE | Admit: 2022-10-09 | Discharge: 2022-10-09 | Disposition: A | Discharge status: Home / Self Care | Payer: Medicare Other | Source: Ambulatory Visit | Attending: Nephrology | Admitting: Nephrology

## 2022-10-09 VITALS — BP 149/80 | HR 76 | Temp 97.4°F | Resp 16

## 2022-10-09 DIAGNOSIS — Z862 Personal history of diseases of the blood and blood-forming organs and certain disorders involving the immune mechanism: Secondary | ICD-10-CM | POA: Diagnosis present

## 2022-10-09 DIAGNOSIS — N189 Chronic kidney disease, unspecified: Secondary | ICD-10-CM | POA: Insufficient documentation

## 2022-10-09 LAB — IRON AND TIBC
Iron: 72 ug/dL (ref 45–182)
Saturation Ratios: 31 % (ref 17.9–39.5)
TIBC: 234 ug/dL — ABNORMAL LOW (ref 250–450)
UIBC: 162 ug/dL

## 2022-10-09 LAB — FERRITIN: Ferritin: 425 ng/mL — ABNORMAL HIGH (ref 24–336)

## 2022-10-09 LAB — POCT HEMOGLOBIN-HEMACUE: Hemoglobin: 9.5 g/dL — ABNORMAL LOW (ref 13.0–17.0)

## 2022-10-09 MED ORDER — DARBEPOETIN ALFA 40 MCG/0.4ML IJ SOSY: 40.0000 ug | PREFILLED_SYRINGE | INTRAMUSCULAR | Status: DC

## 2022-10-09 MED ORDER — DARBEPOETIN ALFA 40 MCG/0.4ML IJ SOSY
PREFILLED_SYRINGE | INTRAMUSCULAR | Status: AC
  Administered 2022-10-09: 40 ug via SUBCUTANEOUS
  Filled 2022-10-09: qty 0.4

## 2022-10-09 NOTE — Progress Notes (Signed)
@Patient  ID: Derek Blackburn, male    DOB: 28-Jul-1935, 87 y.o.   MRN: [id number]  Chief Complaint  Patient presents with   Consult    Hx of DOE and PE.  Referred by hospitalist for pulmonary eval.  Patient currently has a lot of SOB with any exertion.    Referring provider: Narda Bonds, MD  HPI:   87 y.o.   man whom we are seeing for evaluation of dyspnea on exertion.  Multiple hospital notes reviewed.  Discharge summary x 3 over the last few months reviewed.  Dyspnea exertion present for a few weeks likely wife states it sounds like it is dated relatively acutely.  She remembers at night in July they were at dinner.  No issues walking and negative.  At walking out of dinner he was quite dyspneic and labored.  He was hospitalized and seen in the ED several times in July.  First admitted for chest pain.  On send ambulating Supple out and seemed fine.  At that time he underwent left and right heart cath that showed no significant coronary disease but a PVR of 4, mean PA pressure of 32 LVEDP of 7.  Cardiac index and output were preserved.  He was hospitalized again for high blood pressure.  Then in August hospitalized again for shortness of breath.  He has CKD so note CTA protocol was performed with a VQ scan with high probability for multiple areas of pulmonary embolism.  He was placed on anticoagulation.  Dyspnea still persists.  Not really any better.  We discussed role and rationale for further evaluation of this.  As well as evaluation of possible causes of pulmonary hypertension.  Certainly pulmonary tension could be contributing to his symptoms.  Reviewed most recent chest x-ray 08/2022 that looks clear on my review interpretation.  CT chest without contrast 08/2022 showed some scattered areas of focal fibrosis likely postinflammatory on my review and interpretation, otherwise clear lungs.   Questionaires / Pulmonary Flowsheets:   ACT:      No data to display           MMRC:     No data to display          Epworth:      No data to display          Tests:   FENO:  No results found for: "NITRICOXIDE"  PFT:     No data to display          WALK:      No data to display          Imaging: Personally reviewed as per EMR and discussion in this note No results found.  Lab Results: Personally reviewed CBC    Component Value Date/Time   WBC 7.1 09/07/2022 0029   RBC 2.77 (L) 09/07/2022 0029   HGB 9.5 (L) 10/09/2022 1117   HCT 26.8 (L) 09/07/2022 0029   PLT 211 09/07/2022 0029   MCV 96.8 09/07/2022 0029   MCH 29.6 09/07/2022 0029   MCHC 30.6 09/07/2022 0029   RDW 13.7 09/07/2022 0029   LYMPHSABS 1.2 08/06/2022 0900   MONOABS 0.5 08/06/2022 0900   EOSABS 0.3 08/06/2022 0900   BASOSABS 0.1 08/06/2022 0900    BMET    Component Value Date/Time   NA 135 09/07/2022 0029   K 3.9 09/07/2022 0029   CL 107 09/07/2022 0029   CO2 18 (L) 09/07/2022 0029   GLUCOSE 113 (H) 09/07/2022 0029  BUN 36 (H) 09/07/2022 0029   CREATININE 1.90 (H) 09/07/2022 0029   CALCIUM 8.5 (L) 09/07/2022 0029   GFRNONAA 34 (L) 09/07/2022 0029   GFRAA 61 (L) 01/21/2011 1720    BNP    Component Value Date/Time   BNP 1,767.1 (H) 09/03/2022 1519    ProBNP No results found for: "PROBNP"  Specialty Problems       Pulmonary Problems   Dyspnea on exertion    No Known Allergies  Immunization History  Administered Date(s) Administered   Tdap 10/01/2020    Past Medical History:  Diagnosis Date   Anemia    CKD (chronic kidney disease)    Heart murmur    Hypertension    Hypertensive urgency    Peripheral polyneuropathy    Prostate cancer (HCC)    PVD (peripheral vascular disease) (HCC)    Rheumatoid arthritis involving multiple sites with positive rheumatoid factor (HCC) 08/13/2022    Tobacco History: Social History   Tobacco Use  Smoking Status Never  Smokeless Tobacco Never  Tobacco Comments   Patient states he  smokes in the 1960's for about 4 months.  Smoked about half a pack a day.  Updated 10/08/2022 am   Counseling given: Not Answered Tobacco comments: Patient states he smokes in the 1960's for about 4 months.  Smoked about half a pack a day.  Updated 10/08/2022 am   Continue to not smoke  Outpatient Encounter Medications as of 10/08/2022  Medication Sig   acetaminophen (TYLENOL) 650 MG CR tablet Take 650 mg by mouth at bedtime.   amLODipine (NORVASC) 10 MG tablet Take 1 tablet (10 mg total) by mouth daily.   apixaban (ELIQUIS) 5 MG TABS tablet Take 2 tablets (10 mg total) by mouth 2 (two) times daily for 7 days, THEN 1 tablet (5 mg total) 2 (two) times daily.   Apoaequorin (PREVAGEN) 10 MG CAPS Take 10 mg by mouth daily.   atorvastatin (LIPITOR) 10 MG tablet Take 1 tablet (10 mg total) by mouth daily.   Calcium Carbonate (CALCIUM 500 PO) Take 500 mg by mouth daily.   carvedilol (COREG) 12.5 MG tablet Take 1 tablet (12.5 mg total) by mouth 2 (two) times daily with a meal.   darolutamide (NUBEQA) 300 MG tablet Take 600 mg by mouth 2 (two) times daily with a meal. This medication treats Prostate Cancer.   diclofenac Sodium (VOLTAREN) 1 % GEL Apply 2 g topically 4 (four) times daily.   donepezil (ARICEPT) 10 MG tablet Take 1 tablet (10 mg total) by mouth daily.   hydrALAZINE (APRESOLINE) 25 MG tablet Take 3 tablets (75 mg total) by mouth 3 (three) times daily.   latanoprost (XALATAN) 0.005 % ophthalmic solution Place 1 drop into both eyes at bedtime.   leflunomide (ARAVA) 20 MG tablet Take 20 mg by mouth daily.   loratadine (CLARITIN) 10 MG tablet Take 10 mg by mouth daily.   Multiple Vitamin (MULITIVITAMIN WITH MINERALS) TABS Take 1 tablet by mouth daily.   Omega-3 Fatty Acids (FISH OIL) 1200 MG CAPS Take 1,200 mg by mouth daily.   timolol (TIMOPTIC) 0.5 % ophthalmic solution Place 1 drop into both eyes 2 (two) times daily.   Tiotropium Bromide-Olodaterol (STIOLTO RESPIMAT) 2.5-2.5 MCG/ACT AERS  Inhale 2 puffs into the lungs daily.   vitamin B-12 (CYANOCOBALAMIN) 100 MCG tablet Take 100 mcg by mouth daily.   VITAMIN D, CHOLECALCIFEROL, PO Take 1 tablet by mouth daily.   No facility-administered encounter medications on file as of 10/08/2022.  Review of Systems  Review of Systems  No chest pain with exertion.  No orthopnea or PND.  Comprehensive review of systems otherwise negative. Physical Exam  BP 134/70 (BP Location: Left Arm, Patient Position: Sitting, Cuff Size: Normal)   Pulse 73   Temp 98.2 F (36.8 C) (Oral)   Ht 5\' 10"  (1.778 m)   Wt 147 lb 6.4 oz (66.9 kg)   SpO2 98%   BMI 21.15 kg/m   Wt Readings from Last 5 Encounters:  10/08/22 147 lb 6.4 oz (66.9 kg)  09/04/22 146 lb 9.7 oz (66.5 kg)  08/23/22 150 lb 3.2 oz (68.1 kg)  08/15/22 144 lb 10 oz (65.6 kg)  08/08/22 149 lb 4.8 oz (67.7 kg)    BMI Readings from Last 5 Encounters:  10/08/22 21.15 kg/m  09/04/22 21.04 kg/m  08/23/22 21.55 kg/m  08/15/22 20.75 kg/m  08/08/22 20.82 kg/m     Physical Exam General: Sitting in chair, no acute distress Eyes: EOMI, no icterus Neck: Supple, no JVP Pulmonary: Clear, normal work of breathing Cardiovascular: Warm, regular rate and rhythm Abdomen: Nondistended, bowel sounds present MSK: No synovitis, no joint effusion Neuro: Ambulates with assistance of cane, no focal deficits Psych: Normal mood, full affect.  Assessment & Plan:   Pulmonary hypertension: Presumed Group 1 PAH given elevated PVR and normal wedge pressure/LVEDP.  Also at risk for group 4 disease given acute blood clot.  However signs of pulm hypertension on echocardiogram preceded this.  He has aortic valve disease.  This essentially contraindicates PDE 5.  Quite possible there is only group 2 disease despite normal LVEDP.  Frankly, given his age and otherwise mild symptoms, do not think you will benefit from pulmonary vasodilators.  Will assess for sleep apnea and see if this is a possible  reversible or treatable cause of pulmonary hypertension he does not require pulmonary vasodilators.  Submassive PE: Right-sided changes somewhat chronic but seem worse after PE.  Repeat TTE at 26-month interval, 11/2022 and see if there is improvement.  Dyspnea on exertion: Likely related to the above.  PFTs for further evaluation.   Return in about 2 months (around 12/08/2022).   Karren Burly, MD 10/09/2022   This appointment required 61 minutes of patient care (this includes precharting, chart review, review of results, face-to-face care, etc.).

## 2022-10-23 ENCOUNTER — Ambulatory Visit (HOSPITAL_COMMUNITY)
Admission: RE | Admit: 2022-10-23 | Discharge: 2022-10-23 | Disposition: A | Discharge status: Home / Self Care | Payer: Medicare Other | Source: Ambulatory Visit | Attending: Nephrology | Admitting: Nephrology

## 2022-10-23 VITALS — BP 125/75 | HR 57 | Temp 97.5°F | Resp 16

## 2022-10-23 DIAGNOSIS — Z862 Personal history of diseases of the blood and blood-forming organs and certain disorders involving the immune mechanism: Secondary | ICD-10-CM | POA: Insufficient documentation

## 2022-10-23 DIAGNOSIS — N189 Chronic kidney disease, unspecified: Secondary | ICD-10-CM | POA: Diagnosis present

## 2022-10-23 LAB — POCT HEMOGLOBIN-HEMACUE: Hemoglobin: 9.6 g/dL — ABNORMAL LOW (ref 13.0–17.0)

## 2022-10-23 MED ORDER — DARBEPOETIN ALFA 40 MCG/0.4ML IJ SOSY
40.0000 ug | PREFILLED_SYRINGE | INTRAMUSCULAR | Status: DC
  Administered 2022-10-23: 40 ug via SUBCUTANEOUS

## 2022-10-23 MED ORDER — DARBEPOETIN ALFA 40 MCG/0.4ML IJ SOSY
PREFILLED_SYRINGE | INTRAMUSCULAR | Status: AC
  Filled 2022-10-23: qty 0.4

## 2022-10-25 ENCOUNTER — Emergency Department (HOSPITAL_COMMUNITY)
Admission: EM | Admit: 2022-10-25 | Discharge: 2022-10-26 | Disposition: A | Discharge status: Home / Self Care | Payer: Medicare Other | Attending: Emergency Medicine | Admitting: Emergency Medicine

## 2022-10-25 ENCOUNTER — Encounter: Payer: Self-pay | Admitting: Nurse Practitioner

## 2022-10-25 ENCOUNTER — Emergency Department (HOSPITAL_COMMUNITY): Payer: Medicare Other

## 2022-10-25 ENCOUNTER — Encounter (HOSPITAL_COMMUNITY): Payer: Self-pay | Admitting: Emergency Medicine

## 2022-10-25 ENCOUNTER — Other Ambulatory Visit: Payer: Self-pay

## 2022-10-25 ENCOUNTER — Other Ambulatory Visit: Payer: Self-pay | Admitting: Pulmonary Disease

## 2022-10-25 DIAGNOSIS — R112 Nausea with vomiting, unspecified: Secondary | ICD-10-CM | POA: Diagnosis not present

## 2022-10-25 DIAGNOSIS — Z8546 Personal history of malignant neoplasm of prostate: Secondary | ICD-10-CM | POA: Insufficient documentation

## 2022-10-25 DIAGNOSIS — Z79899 Other long term (current) drug therapy: Secondary | ICD-10-CM | POA: Insufficient documentation

## 2022-10-25 DIAGNOSIS — I10 Essential (primary) hypertension: Secondary | ICD-10-CM | POA: Insufficient documentation

## 2022-10-25 DIAGNOSIS — R5383 Other fatigue: Secondary | ICD-10-CM | POA: Insufficient documentation

## 2022-10-25 DIAGNOSIS — R7989 Other specified abnormal findings of blood chemistry: Secondary | ICD-10-CM | POA: Diagnosis not present

## 2022-10-25 DIAGNOSIS — Z7901 Long term (current) use of anticoagulants: Secondary | ICD-10-CM | POA: Insufficient documentation

## 2022-10-25 LAB — CBC WITH DIFFERENTIAL/PLATELET
Abs Immature Granulocytes: 0.03 10*3/uL (ref 0.00–0.07)
Basophils Absolute: 0.1 10*3/uL (ref 0.0–0.1)
Basophils Relative: 1 %
Eosinophils Absolute: 0.4 10*3/uL (ref 0.0–0.5)
Eosinophils Relative: 4 %
HCT: 32.5 % — ABNORMAL LOW (ref 39.0–52.0)
Hemoglobin: 9.8 g/dL — ABNORMAL LOW (ref 13.0–17.0)
Immature Granulocytes: 0 %
Lymphocytes Relative: 11 %
Lymphs Abs: 0.9 10*3/uL (ref 0.7–4.0)
MCH: 29.3 pg (ref 26.0–34.0)
MCHC: 30.2 g/dL (ref 30.0–36.0)
MCV: 97.3 fL (ref 80.0–100.0)
Monocytes Absolute: 0.4 10*3/uL (ref 0.1–1.0)
Monocytes Relative: 5 %
Neutro Abs: 6.6 10*3/uL (ref 1.7–7.7)
Neutrophils Relative %: 79 %
Platelets: 269 10*3/uL (ref 150–400)
RBC: 3.34 MIL/uL — ABNORMAL LOW (ref 4.22–5.81)
RDW: 14.2 % (ref 11.5–15.5)
WBC: 8.4 10*3/uL (ref 4.0–10.5)
nRBC: 0 % (ref 0.0–0.2)

## 2022-10-25 LAB — COMPREHENSIVE METABOLIC PANEL
ALT: 15 U/L (ref 0–44)
AST: 21 U/L (ref 15–41)
Albumin: 3.6 g/dL (ref 3.5–5.0)
Alkaline Phosphatase: 53 U/L (ref 38–126)
Anion gap: 11 (ref 5–15)
BUN: 35 mg/dL — ABNORMAL HIGH (ref 8–23)
CO2: 19 mmol/L — ABNORMAL LOW (ref 22–32)
Calcium: 9.7 mg/dL (ref 8.9–10.3)
Chloride: 110 mmol/L (ref 98–111)
Creatinine, Ser: 2.21 mg/dL — ABNORMAL HIGH (ref 0.61–1.24)
GFR, Estimated: 28 mL/min — ABNORMAL LOW (ref >60)
Glucose, Bld: 142 mg/dL — ABNORMAL HIGH (ref 70–99)
Potassium: 4.1 mmol/L (ref 3.5–5.1)
Sodium: 140 mmol/L (ref 135–145)
Total Bilirubin: 1 mg/dL (ref 0.3–1.2)
Total Protein: 7.6 g/dL (ref 6.5–8.1)

## 2022-10-25 LAB — URINALYSIS, ROUTINE W REFLEX MICROSCOPIC
Bacteria, UA: NONE SEEN
Bilirubin Urine: NEGATIVE
Glucose, UA: NEGATIVE mg/dL
Hgb urine dipstick: NEGATIVE
Ketones, ur: NEGATIVE mg/dL
Leukocytes,Ua: NEGATIVE
Nitrite: NEGATIVE
Protein, ur: 100 mg/dL — AB
Specific Gravity, Urine: 1.018 (ref 1.005–1.030)
pH: 5 (ref 5.0–8.0)

## 2022-10-25 LAB — LIPASE, BLOOD: Lipase: 80 U/L — ABNORMAL HIGH (ref 11–51)

## 2022-10-25 MED ORDER — SODIUM CHLORIDE 0.9 % IV BOLUS
1000.0000 mL | Freq: Once | INTRAVENOUS | Status: AC
  Administered 2022-10-25: 1000 mL via INTRAVENOUS

## 2022-10-25 MED ORDER — SODIUM CHLORIDE 0.9 % IV SOLN
INTRAVENOUS | Status: DC
  Administered 2022-10-25: 1000 mL via INTRAVENOUS

## 2022-10-25 MED ORDER — ONDANSETRON HCL 4 MG/2ML IJ SOLN
4.0000 mg | Freq: Once | INTRAMUSCULAR | Status: AC
  Administered 2022-10-25: 4 mg via INTRAVENOUS
  Filled 2022-10-25: qty 2

## 2022-10-25 MED ORDER — APIXABAN 5 MG PO TABS: 5.0000 mg | ORAL_TABLET | Freq: Two times a day (BID) | ORAL | 2 refills | Status: DC

## 2022-10-25 MED ORDER — ONDANSETRON 8 MG PO TBDP: 8.0000 mg | ORAL_TABLET | Freq: Three times a day (TID) | ORAL | 0 refills | Status: DC | PRN

## 2022-10-25 NOTE — Discharge Instructions (Signed)
Take the medications as needed for nausea and vomiting.  Return to the ER for fevers chills abdominal pain recurrent vomiting or other concerning symptoms.  Follow-up with your doctor to be rechecked

## 2022-10-25 NOTE — ED Provider Notes (Signed)
[place] EMERGENCY DEPARTMENT AT [facility] Provider Note   CSN: [id number] Arrival date & time: 10/25/22  2040     History  Chief Complaint  Patient presents with   Fatigue    Arrived via home from EMS with c/o generalized weakness and lethargy for one week. Occurring right after he eats. Ate soup tonight. Denies pain.    Emesis    After eating. Actively vomiting on arrival to ER and during triage.     Derek Blackburn is a 87 y.o. male.   Emesis    Pt has history of chronic any disease, hypertension, prostate cancer, memory impairment, hyperlipidemia, rheumatoid arthritis, pulmonary embolism, acute kidney injury. Pt was at home this evening getting ready for bed when he suddenly started feeling weak and vomiting.  He was feeling weak all over.  No headaches.  No cp or abd pain.  NO urinary symptoms.  He is not sure if he passed out but almost passed out when this occurred.  Triage notes indicate symptoms have been ongoing for the last week but the patient reports these symptoms started tonight  Home Medications Prior to Admission medications   Medication Sig Start Date End Date Taking? Authorizing Provider  ondansetron (ZOFRAN-ODT) 8 MG disintegrating tablet Take 1 tablet (8 mg total) by mouth every 8 (eight) hours as needed for nausea or vomiting. 10/25/22  Yes Linwood Dibbles, MD  acetaminophen (TYLENOL) 650 MG CR tablet Take 650 mg by mouth at bedtime.    [provider]  amLODipine (NORVASC) 10 MG tablet Take 1 tablet (10 mg total) by mouth daily. 06/03/22   Kathleene Hazel, MD  apixaban (ELIQUIS) 5 MG TABS tablet Take 1 tablet (5 mg total) by mouth 2 (two) times daily. 10/25/22   Cobb, Ruby Cola, NP  Apoaequorin (PREVAGEN) 10 MG CAPS Take 10 mg by mouth daily.    [provider]  atorvastatin (LIPITOR) 10 MG tablet Take 1 tablet (10 mg total) by mouth daily. 08/09/22   Burnadette Pop, MD  Calcium Carbonate (CALCIUM 500 PO) Take 500 mg by  mouth daily.    [provider]  carvedilol (COREG) 12.5 MG tablet Take 1 tablet (12.5 mg total) by mouth 2 (two) times daily with a meal. 08/15/22   Narda Bonds, MD  darolutamide (NUBEQA) 300 MG tablet Take 600 mg by mouth 2 (two) times daily with a meal. This medication treats Prostate Cancer.    [provider]  diclofenac Sodium (VOLTAREN) 1 % GEL Apply 2 g topically 4 (four) times daily. 09/07/22   Noralee Stain, DO  donepezil (ARICEPT) 10 MG tablet Take 1 tablet (10 mg total) by mouth daily. 06/19/22   Marcos Eke, PA-C  hydrALAZINE (APRESOLINE) 25 MG tablet Take 3 tablets (75 mg total) by mouth 3 (three) times daily. 08/08/22   Burnadette Pop, MD  latanoprost (XALATAN) 0.005 % ophthalmic solution Place 1 drop into both eyes at bedtime. 11/18/19   [provider]  leflunomide (ARAVA) 20 MG tablet Take 20 mg by mouth daily. 07/02/22   [provider]  loratadine (CLARITIN) 10 MG tablet Take 10 mg by mouth daily.    [provider]  Multiple Vitamin (MULITIVITAMIN WITH MINERALS) TABS Take 1 tablet by mouth daily.    [provider]  Omega-3 Fatty Acids (FISH OIL) 1200 MG CAPS Take 1,200 mg by mouth daily.    [provider]  timolol (TIMOPTIC) 0.5 % ophthalmic solution Place 1 drop into  both eyes 2 (two) times daily. 03/23/22   [provider]  Tiotropium Bromide-Olodaterol (STIOLTO RESPIMAT) 2.5-2.5 MCG/ACT AERS Inhale 2 puffs into the lungs daily. 10/08/22   Hunsucker, Lesia Sago, MD  vitamin B-12 (CYANOCOBALAMIN) 100 MCG tablet Take 100 mcg by mouth daily.    [provider]  VITAMIN D, CHOLECALCIFEROL, PO Take 1 tablet by mouth daily.    [provider]      Allergies    Patient has no known allergies.    Review of Systems   Review of Systems  Gastrointestinal:  Positive for vomiting.    Physical Exam Updated Vital Signs BP (!) 151/81 (BP Location: Right Arm)   Pulse 70   Temp 97.7 F (36.5  C) (Oral)   Resp 18   Ht 1.778 m (5\' 10" )   Wt 72.6 kg   SpO2 97%   BMI 22.96 kg/m  Physical Exam Vitals and nursing note reviewed.  Constitutional:      General: He is not in acute distress.    Appearance: He is well-developed.  HENT:     Head: Normocephalic and atraumatic.     Right Ear: External ear normal.     Left Ear: External ear normal.  Eyes:     General: No scleral icterus.       Right eye: No discharge.        Left eye: No discharge.     Conjunctiva/sclera: Conjunctivae normal.  Neck:     Trachea: No tracheal deviation.  Cardiovascular:     Rate and Rhythm: Normal rate and regular rhythm.  Pulmonary:     Effort: Pulmonary effort is normal. No respiratory distress.     Breath sounds: Normal breath sounds. No stridor. No wheezing or rales.  Abdominal:     General: Bowel sounds are normal. There is no distension.     Palpations: Abdomen is soft.     Tenderness: There is no abdominal tenderness. There is no guarding or rebound.  Musculoskeletal:        General: No tenderness or deformity.     Cervical back: Neck supple.  Skin:    General: Skin is warm and dry.     Findings: No rash.  Neurological:     General: No focal deficit present.     Mental Status: He is alert.     Cranial Nerves: No cranial nerve deficit, dysarthria or facial asymmetry.     Sensory: No sensory deficit.     Motor: No abnormal muscle tone or seizure activity.     Coordination: Coordination normal.  Psychiatric:        Mood and Affect: Mood normal.     ED Results / Procedures / Treatments   Labs (all labs ordered are listed, but only abnormal results are displayed) Labs Reviewed  COMPREHENSIVE METABOLIC PANEL - Abnormal; Notable for the following components:      Result Value   CO2 19 (*)    Glucose, Bld 142 (*)    BUN 35 (*)    Creatinine, Ser 2.21 (*)    GFR, Estimated 28 (*)    All other components within normal limits  LIPASE, BLOOD - Abnormal; Notable for the following  components:   Lipase 80 (*)    All other components within normal limits  CBC WITH DIFFERENTIAL/PLATELET - Abnormal; Notable for the following components:   RBC 3.34 (*)    Hemoglobin 9.8 (*)    HCT 32.5 (*)    All other components  within normal limits  URINALYSIS, ROUTINE W REFLEX MICROSCOPIC - Abnormal; Notable for the following components:   Protein, ur 100 (*)    All other components within normal limits    EKG EKG Interpretation Date/Time:  Friday October 25 2022 21:42:54 EDT Ventricular Rate:  79 PR Interval:  213 QRS Duration:  83 QT Interval:  404 QTC Calculation: 464 R Axis:   -14  Text Interpretation: Sinus rhythm Ventricular premature complex Borderline prolonged PR interval Probable left atrial enlargement No significant change since last tracing Confirmed by Linwood Dibbles [phone]) on 10/25/2022 9:47:01 PM  Radiology CT ABDOMEN PELVIS WO CONTRAST  Result Date: 10/25/2022 CLINICAL DATA:  Abdominal pain, acute, nonlocalized.  Emesis. EXAM: CT ABDOMEN AND PELVIS WITHOUT CONTRAST TECHNIQUE: Multidetector CT imaging of the abdomen and pelvis was performed following the standard protocol without IV contrast. RADIATION DOSE REDUCTION: This exam was performed according to the departmental dose-optimization program which includes automated exposure control, adjustment of the mA and/or kV according to patient size and/or use of iterative reconstruction technique. COMPARISON:  05/18/2018. FINDINGS: Lower chest: Heart is enlarged and there is no pericardial effusion. Calcified mediastinal and hilar lymph nodes are present bilaterally. Strandy atelectasis or scarring is noted at the lung bases. Hepatobiliary: No focal liver abnormality is seen. No gallstones, gallbladder wall thickening, or biliary dilatation. Pancreas: Unremarkable. No pancreatic ductal dilatation or surrounding inflammatory changes. Spleen: Normal in size without focal abnormality. Adrenals/Urinary Tract: The adrenal glands  are within normal limits. Cysts are noted in the kidneys bilaterally. Nonobstructive renal calculi are noted bilaterally. No bladder wall thickening. A bladder diverticulum is present on the left. Stomach/Bowel: Stomach is within normal limits. Appendix appears normal. No evidence of bowel wall thickening, distention, or inflammatory changes. No free air or pneumatosis. Vascular/Lymphatic: Aortic atherosclerosis. No enlarged abdominal or pelvic lymph nodes. Reproductive: The prostate gland is prominent in size and protrudes into the base of the urinary bladder. Other: No abdominopelvic ascites. A few small fat containing hernias are noted in the anterior abdominal wall in the midline. Musculoskeletal: Degenerative changes are present in the thoracolumbar spine. No acute osseous abnormality. IMPRESSION: 1. No acute intra-abdominal process. 2. Bilateral nephrolithiasis without evidence of obstructive uropathy. 3. Bladder diverticulum on the left. 4. Prominent prostate gland protruding into the base of the urinary bladder. 5. Aortic atherosclerosis. Electronically Signed   By: Thornell Sartorius M.D.   On: 10/25/2022 23:07   DG Abdomen Acute W/Chest  Result Date: 10/25/2022 CLINICAL DATA:  Vomiting, weakness EXAM: DG ABDOMEN ACUTE WITH 1 VIEW CHEST COMPARISON:  09/05/2022 FINDINGS: Heart and mediastinal contours are within normal limits. No focal opacities or effusions. No acute bony abnormality. There is normal bowel gas pattern. No free air. No organomegaly or suspicious calcification. No acute bony abnormality. IMPRESSION: Negative abdominal radiographs.  No acute cardiopulmonary disease. Electronically Signed   By: Charlett Nose M.D.   On: 10/25/2022 22:24    Procedures Procedures    Medications Ordered in ED Medications  sodium chloride 0.9 % bolus 1,000 mL (1,000 mLs Intravenous New Bag/Given 10/25/22 2138)    And  0.9 %  sodium chloride infusion (1,000 mLs Intravenous New Bag/Given 10/25/22 2138)   ondansetron (ZOFRAN) injection 4 mg (4 mg Intravenous Given 10/25/22 2138)    ED Course/ Medical Decision Making/ A&P Clinical Course as of 10/25/22 2339  Fri Oct 25, 2022  2233 Lipase, blood(!) Lipase increased at 80 [JK]  2233 Comprehensive metabolic panel(!) Creatinine elevated, slightly increased compared to previous [JK]  2233 CBC with Diff(!) Anemia noted hemoglobin stable compared to previous [JK]  2233 Abdominal films without signs of obstruction [JK]  2320 CT scan without acute process. [JK]    Clinical Course User Index [JK] Linwood Dibbles, MD                                 Medical Decision Making Problems Addressed: Nausea and vomiting, unspecified vomiting type: acute illness or injury that poses a threat to life or bodily functions Other fatigue: acute illness or injury that poses a threat to life or bodily functions  Amount and/or Complexity of Data Reviewed Labs: ordered. Decision-making details documented in ED Course. Radiology: ordered and independent interpretation performed.  Risk Prescription drug management.   Patient presented to the ED for evaluation of fatigue nausea vomiting.  While in the ED patient had normal vital signs.  He was not complain of any chest pain or abdominal pain.  Laboratory tests did show slight elevated lipase but otherwise stable renal function and anemia.  CT scan does not show any signs of acute process.  No obstruction.  No colitis diverticulitis.  Patient states he is feeling better.  He was able to eat and drink.  My concern that has been feeling fatigued lately.  Unclear etiology for that but no signs of any acute process.  He did have 1 episode of vomiting today but no recurrent episodes and no abnormal findings on CT scan.  Will discharge home with medications for nausea vomiting and close outpatient follow-up        Final Clinical Impression(s) / ED Diagnoses Final diagnoses:  Other fatigue  Nausea and vomiting,  unspecified vomiting type    Rx / DC Orders ED Discharge Orders          Ordered    ondansetron (ZOFRAN-ODT) 8 MG disintegrating tablet  Every 8 hours PRN        10/25/22 2336              Linwood Dibbles, MD 10/25/22 2339

## 2022-10-25 NOTE — ED Notes (Signed)
Patient transported to X-ray 

## 2022-10-25 NOTE — ED Notes (Signed)
Patient returned back to room from x-ray

## 2022-10-25 NOTE — Progress Notes (Signed)
10/25/2022 after hours call: patient wife, Magda Paganini contacted answering service in regards to refill of Eliquis. patient only has two tablets left. RX sent to pharmacy. aware of symptoms to monitor for a while taking AC therapy and bleeding precautions

## 2022-10-25 NOTE — ED Notes (Signed)
ED Provider at bedside. 

## 2022-10-25 NOTE — ED Notes (Signed)
Family at bedside. 

## 2022-10-25 NOTE — ED Notes (Signed)
Pt aware urine specimen is needed. Pt given urinal.

## 2022-10-25 NOTE — ED Notes (Signed)
Patient transported to CT 

## 2022-10-26 NOTE — ED Notes (Signed)
Pt able to drink without nausea or vomiting.

## 2022-10-29 ENCOUNTER — Telehealth: Payer: Self-pay | Admitting: Pulmonary Disease

## 2022-10-29 NOTE — Telephone Encounter (Signed)
Patient called answering service requesting refill, only 2 pills remaining, call to advise at [phone]. Unsure which medication

## 2022-11-01 ENCOUNTER — Telehealth: Payer: Self-pay | Admitting: Cardiovascular Disease

## 2022-11-01 NOTE — Telephone Encounter (Signed)
Spoke with patient and no refills needed at this time.

## 2022-11-01 NOTE — Telephone Encounter (Signed)
Patient's wife states patient was advised that he would need to have testing. She would like to clarify what testing he needs and whether or not he still needs it. Please advise.

## 2022-11-03 ENCOUNTER — Encounter (HOSPITAL_COMMUNITY): Payer: Self-pay

## 2022-11-04 NOTE — Telephone Encounter (Signed)
Called and spoke w patient's wife.  Adv I would send a message to scheduling and to arrange the ultrasound of carotid and renal artery and that they should be completed before the appointment in Nov w Tessa.   She said the patient was hospitalized in August and that is why it was not done sooner.

## 2022-11-06 ENCOUNTER — Encounter (HOSPITAL_COMMUNITY): Payer: Medicare Other

## 2022-11-08 ENCOUNTER — Ambulatory Visit (HOSPITAL_COMMUNITY)
Admission: RE | Admit: 2022-11-08 | Discharge: 2022-11-08 | Disposition: A | Discharge status: Home / Self Care | Payer: Medicare HMO | Source: Ambulatory Visit | Attending: Nephrology | Admitting: Nephrology

## 2022-11-08 VITALS — BP 132/81 | HR 72 | Temp 97.3°F | Resp 17

## 2022-11-08 DIAGNOSIS — N189 Chronic kidney disease, unspecified: Secondary | ICD-10-CM | POA: Insufficient documentation

## 2022-11-08 DIAGNOSIS — Z862 Personal history of diseases of the blood and blood-forming organs and certain disorders involving the immune mechanism: Secondary | ICD-10-CM | POA: Insufficient documentation

## 2022-11-08 LAB — IRON AND TIBC
Iron: 89 ug/dL (ref 45–182)
Saturation Ratios: 35 % (ref 17.9–39.5)
TIBC: 258 ug/dL (ref 250–450)
UIBC: 169 ug/dL

## 2022-11-08 LAB — FERRITIN: Ferritin: 415 ng/mL — ABNORMAL HIGH (ref 24–336)

## 2022-11-08 LAB — POCT HEMOGLOBIN-HEMACUE: Hemoglobin: 9.6 g/dL — ABNORMAL LOW (ref 13.0–17.0)

## 2022-11-08 MED ORDER — DARBEPOETIN ALFA 40 MCG/0.4ML IJ SOSY: 40.0000 ug | PREFILLED_SYRINGE | INTRAMUSCULAR | Status: DC

## 2022-11-08 MED ORDER — DARBEPOETIN ALFA 40 MCG/0.4ML IJ SOSY
PREFILLED_SYRINGE | INTRAMUSCULAR | Status: AC
  Administered 2022-11-08: 40 ug via SUBCUTANEOUS
  Filled 2022-11-08: qty 0.4

## 2022-11-19 ENCOUNTER — Other Ambulatory Visit: Payer: Self-pay | Admitting: Physician Assistant

## 2022-11-19 ENCOUNTER — Ambulatory Visit (HOSPITAL_COMMUNITY)
Admission: RE | Admit: 2022-11-19 | Discharge: 2022-11-19 | Disposition: A | Discharge status: Home / Self Care | Payer: Medicare HMO | Source: Ambulatory Visit | Attending: Physician Assistant | Admitting: Physician Assistant

## 2022-11-19 ENCOUNTER — Ambulatory Visit (HOSPITAL_BASED_OUTPATIENT_CLINIC_OR_DEPARTMENT_OTHER)
Admission: RE | Admit: 2022-11-19 | Discharge: 2022-11-19 | Disposition: A | Discharge status: Home / Self Care | Payer: Medicare HMO | Source: Ambulatory Visit | Attending: Cardiology | Admitting: Cardiology

## 2022-11-19 DIAGNOSIS — E785 Hyperlipidemia, unspecified: Secondary | ICD-10-CM

## 2022-11-19 DIAGNOSIS — R221 Localized swelling, mass and lump, neck: Secondary | ICD-10-CM | POA: Insufficient documentation

## 2022-11-19 DIAGNOSIS — R0609 Other forms of dyspnea: Secondary | ICD-10-CM

## 2022-11-19 DIAGNOSIS — I10 Essential (primary) hypertension: Secondary | ICD-10-CM | POA: Diagnosis present

## 2022-11-19 DIAGNOSIS — R2681 Unsteadiness on feet: Secondary | ICD-10-CM

## 2022-11-19 DIAGNOSIS — N1832 Chronic kidney disease, stage 3b: Secondary | ICD-10-CM

## 2022-11-19 DIAGNOSIS — I160 Hypertensive urgency: Secondary | ICD-10-CM

## 2022-11-19 DIAGNOSIS — R413 Other amnesia: Secondary | ICD-10-CM

## 2022-11-20 ENCOUNTER — Encounter (HOSPITAL_COMMUNITY): Payer: Medicare Other

## 2022-11-21 ENCOUNTER — Encounter (HOSPITAL_COMMUNITY): Payer: Self-pay

## 2022-11-22 ENCOUNTER — Encounter (HOSPITAL_COMMUNITY)
Admission: RE | Admit: 2022-11-22 | Discharge: 2022-11-22 | Disposition: A | Discharge status: Home / Self Care | Payer: Medicare HMO | Source: Ambulatory Visit | Attending: Nephrology | Admitting: Nephrology

## 2022-11-22 ENCOUNTER — Ambulatory Visit: Payer: Medicare Other | Admitting: Physician Assistant

## 2022-11-22 VITALS — BP 138/75 | HR 74 | Temp 97.5°F | Resp 16

## 2022-11-22 DIAGNOSIS — N189 Chronic kidney disease, unspecified: Secondary | ICD-10-CM | POA: Diagnosis present

## 2022-11-22 DIAGNOSIS — Z862 Personal history of diseases of the blood and blood-forming organs and certain disorders involving the immune mechanism: Secondary | ICD-10-CM | POA: Diagnosis present

## 2022-11-22 LAB — POCT HEMOGLOBIN-HEMACUE: Hemoglobin: 9.8 g/dL — ABNORMAL LOW (ref 13.0–17.0)

## 2022-11-22 MED ORDER — DARBEPOETIN ALFA 40 MCG/0.4ML IJ SOSY
40.0000 ug | PREFILLED_SYRINGE | INTRAMUSCULAR | Status: DC
  Administered 2022-11-22: 40 ug via SUBCUTANEOUS

## 2022-11-22 MED ORDER — DARBEPOETIN ALFA 40 MCG/0.4ML IJ SOSY
PREFILLED_SYRINGE | INTRAMUSCULAR | Status: AC
  Filled 2022-11-22: qty 0.4

## 2022-11-26 ENCOUNTER — Encounter (HOSPITAL_COMMUNITY): Payer: Self-pay | Admitting: Pulmonary Disease

## 2022-12-02 ENCOUNTER — Ambulatory Visit: Payer: Medicare HMO | Attending: Physician Assistant | Admitting: Physician Assistant

## 2022-12-02 ENCOUNTER — Encounter: Payer: Self-pay | Admitting: Physician Assistant

## 2022-12-02 VITALS — BP 120/80 | HR 69 | Ht 70.0 in | Wt 143.8 lb

## 2022-12-02 DIAGNOSIS — N1832 Chronic kidney disease, stage 3b: Secondary | ICD-10-CM

## 2022-12-02 DIAGNOSIS — I160 Hypertensive urgency: Secondary | ICD-10-CM

## 2022-12-02 DIAGNOSIS — I10 Essential (primary) hypertension: Secondary | ICD-10-CM | POA: Diagnosis not present

## 2022-12-02 DIAGNOSIS — E785 Hyperlipidemia, unspecified: Secondary | ICD-10-CM

## 2022-12-02 DIAGNOSIS — I350 Nonrheumatic aortic (valve) stenosis: Secondary | ICD-10-CM

## 2022-12-02 DIAGNOSIS — R0609 Other forms of dyspnea: Secondary | ICD-10-CM

## 2022-12-02 DIAGNOSIS — E049 Nontoxic goiter, unspecified: Secondary | ICD-10-CM

## 2022-12-02 MED ORDER — CARVEDILOL 6.25 MG PO TABS: 6.2500 mg | ORAL_TABLET | Freq: Two times a day (BID) | ORAL | 3 refills | Status: DC

## 2022-12-02 MED ORDER — ATORVASTATIN CALCIUM 10 MG PO TABS: 10.0000 mg | ORAL_TABLET | Freq: Every day | ORAL | 3 refills | Status: DC

## 2022-12-02 NOTE — Progress Notes (Addendum)
Cardiology Office Note:  .   Date:  12/02/2022  ID:  Derek Blackburn, DOB May 08, 1935, MRN [id number] PCP: Diamantina Providence, FNP   HeartCare Providers Cardiologist:  Verne Carrow, MD {  History of Present Illness: .   Derek Blackburn is a 87 y.o. male with past medical history of anemia, CAD, HTN, prostate cancer and PAD who presents for follow-up appointment.  He was 11/2021 as a new consult.  He was evaluated for cardiac murmur.  No complaints.  Denied chest pain, dyspnea, lower extremity edema, and dizziness.  Echocardiogram was ordered with LVEF 60 to 65%, grade 1 DD, left and right atrial size was moderately dilated, mild to moderate TR, moderate aortic stenosis, borderline dilatation of ascending aorta measuring 38 mm.  He was seen by Dr. Clifton James 05/2022 and at that time he denies chest pain, dyspnea, palpitations, tremor edema, orthopnea, PND, dizziness, syncope, near syncope.  BP was elevated at 1 40-1 60 at home.  Presented to the ED 7/9 and was found to have a mildly elevated troponin, elevated BNP.  Right left heart catheterization was ordered showing minimal nonobstructive coronary artery disease.  Was chest pain-free and hemodynamically stable.  Cleared for discharge.  Ultimately, seen in the ED again 7/16  for shortness of breath.  Patient had evidence of hypertensive urgency that was likely causing symptoms.  Was restarted on home antihypertensives with carvedilol being increased.  Significant improvement of blood pressure.  Blood pressure was as high as 213 systolic.  He was seen by me 08/23/22, he tells me that his BP has been higher but well controlled today in the clinic. He has been tracking at home and usually 147/91.  Today in the clinic it is 128/74.  Recently in the ED and it was extremely high.  He is asked to continue his amlodipine 10 mg daily, carvedilol 12.5 mg twice a day, hydralazine 75 mg 3 times a day.  He did not know he was a diabetic  and is taking Jardiance.  The right side of his neck is swollen today.  We will plan to get an ultrasound for further evaluation.  We also discussed getting an ultrasound of his renal arteries to check why his blood pressure has been so difficult to control.  Recent testing including echocardiogram and cardiac catheterization reviewed today.  Moderate aortic valve stenosis.   Today, he  presents with a history of a pulmonary embolism and hypertension,  with a goiter on the right side of the neck, fatigue, and chronic back pain. The goiter was identified on a recent ultrasound, measuring 10 by 5.5 centimeters. The patient has not yet followed up with his primary care provider regarding this finding. The patient reports fatigue, which he believes may be related to his medication, specifically Carvedilol. The patient also reports chronic back pain, which he manages with Tylenol BID. The back pain is a constant presence, and the patient reports feeling a little weak. The patient has previously had back surgery and continues to experience issues. We recommended follow-up with his back doctor. He also has some SOB from time to time.  Reports no shortness of breath nor dyspnea on exertion. Reports no chest pain, pressure, or tightness. No edema, orthopnea, PND. Reports no palpitations.   Discussed the use of AI scribe software for clinical note transcription with the patient, who gave verbal consent to proceed.       ROS: Pertinent ROS in HPI  Studies Reviewed: .  Cardiac catheterization 08/07/2022  LV end diastolic pressure is normal.   Hemodynamic findings consistent with mild pulmonary hypertension.   There is mild aortic valve stenosis.   Minimal nonobstructive CAD Mild aortic stenosis. Mean AV gradient 11 mm Hg. AVA 2.05 cm squared with index 1.1 Normal LV filling pressures. LVEDP 7 mm Hg. PCWP 8/7 with mean 6 mm Hg Mild pulmonary HTN. PAP 60/14 with mean 32 mm Hg Normal RA pressure mean 9  mm Hg Normal cardiac output 5.73 L/min, index 3.08.   Plan: medical management.      Physical Exam:   VS:  BP 120/80   Pulse 69   Ht 5\' 10"  (1.778 m)   Wt 143 lb 12.8 oz (65.2 kg)   SpO2 98%   BMI 20.63 kg/m    Wt Readings from Last 3 Encounters:  12/02/22 143 lb 12.8 oz (65.2 kg)  10/25/22 160 lb (72.6 kg)  10/08/22 147 lb 6.4 oz (66.9 kg)    GEN: Well nourished, well developed in no acute distress NECK: No JVD; No carotid bruits, right neck mass noted CARDIAC: RRR, + systolic murmur, rubs, gallops RESPIRATORY:  Clear to auscultation without rales, wheezing or rhonchi  ABDOMEN: Soft, non-tender, non-distended EXTREMITIES:  No edema; No deformity   ASSESSMENT AND PLAN: .    CKD stage IIIb -follow-up with renal -2.1 creatinine  Hyperlipidemia -when he sees PCP, he will have labs done  Moderate Aortic valve stenosis  -will need annual echos, due 08/2023 -no syncope or presyncope   Neck Mass (Goiter) Identified on ultrasound, measures 10x5.5 cm on the right side. No follow-up with primary care yet. Discussed potential benign nature and possible need for biopsy if suspicious features are identified on further imaging. No current airway compromise reported. -Order thyroid function tests today. -Recommend follow-up with primary care for soft tissue ultrasound of neck.  Chronic Back Pain History of back surgery 20 years ago, currently managed with Tylenol and occasional Tadanor. Reports constant, non-severe pain and weakness. -Reduce Carvedilol to 6.25mg  twice daily to assess for potential medication-related fatigue. -Recommend trying Tylenol at night only and consider over-the-counter patches for pain relief during the day. -Advise follow-up with back specialist for potential further intervention.  Hypertension Well-controlled, BP 120/80 today. On Carvedilol, dose to be reduced due to potential contribution to fatigue. -Reduce Carvedilol to 6.25mg  twice daily.  Continue  current medications including amlodipine 10 mg daily, hydralazine 75 mg 3 times daily -Monitor blood pressure at home and report any significant increases.  Fatigue Possible multifactorial etiology including medication side effects and thyroid dysfunction. -Reduce Carvedilol to 6.25mg  twice daily. -Order thyroid function tests today. -Advise patient to monitor symptoms and report any changes.  Pulmonary Embolism (history) Treated with Eliquis 5mg  twice daily. No current symptoms reported. -Continue Eliquis 5mg  twice daily.      Dispo: He can return in 3 months to see Dr. Clifton James or APP  Signed, Sharlene Dory, PA-C

## 2022-12-02 NOTE — Patient Instructions (Signed)
Medication Instructions:  Your physician has recommended you make the following change in your medication:   REDUCE the Carvedilol to 6. 25 taking 1 twice a day  *If you need a refill on your cardiac medications before your next appointment, please call your pharmacy*   Lab Work: TODAY:  TSH, FREE T4, & FREE T3  If you have labs (blood work) drawn today and your tests are completely normal, you will receive your results only by: MyChart Message (if you have MyChart) OR A paper copy in the mail If you have any lab test that is abnormal or we need to change your treatment, we will call you to review the results.   Testing/Procedures: None ordered   Follow-Up: At [facility], you and your health needs are our priority.  As part of our continuing mission to provide you with exceptional heart care, we have created designated Provider Care Teams.  These Care Teams include your primary Cardiologist (physician) and Advanced Practice Providers (APPs -  Physician Assistants and Nurse Practitioners) who all work together to provide you with the care you need, when you need it.  We recommend signing up for the patient portal called "MyChart".  Sign up information is provided on this After Visit Summary.  MyChart is used to connect with patients for Virtual Visits (Telemedicine).  Patients are able to view lab/test results, encounter notes, upcoming appointments, etc.  Non-urgent messages can be sent to your provider as well.   To learn more about what you can do with MyChart, go to [email or web address].    Your next appointment:   3 month(s)  Provider:   Jari Favre, PA-C         Other Instructions Your physician has requested that you regularly monitor and record your blood pressure readings at home. Please use the same machine at the same time of day to check your readings and record them to bring to your follow-up visit.   Please monitor blood pressures and keep a log of your  readings.    for 2 weeks then send them readings in via mychart\. Make sure to check 2 hours after your medications.    AVOID these things for 30 minutes before checking your blood pressure: No Drinking caffeine. No Drinking alcohol. No Eating. No Smoking. No Exercising.   Five minutes before checking your blood pressure: Pee. Sit in a dining chair. Avoid sitting in a soft couch or armchair. Be quiet. Do not talk

## 2022-12-03 LAB — T4, FREE: Free T4: 1.08 ng/dL (ref 0.82–1.77)

## 2022-12-03 LAB — TSH: TSH: 0.353 u[IU]/mL — ABNORMAL LOW (ref 0.450–4.500)

## 2022-12-03 LAB — T3, FREE: T3, Free: 2.9 pg/mL (ref 2.0–4.4)

## 2022-12-06 ENCOUNTER — Encounter (HOSPITAL_COMMUNITY)
Admission: RE | Admit: 2022-12-06 | Discharge: 2022-12-06 | Disposition: A | Discharge status: Home / Self Care | Payer: Medicare HMO | Source: Ambulatory Visit | Attending: Nephrology | Admitting: Nephrology

## 2022-12-06 VITALS — BP 133/87 | HR 81 | Temp 97.5°F | Resp 16

## 2022-12-06 DIAGNOSIS — N189 Chronic kidney disease, unspecified: Secondary | ICD-10-CM | POA: Insufficient documentation

## 2022-12-06 DIAGNOSIS — Z862 Personal history of diseases of the blood and blood-forming organs and certain disorders involving the immune mechanism: Secondary | ICD-10-CM | POA: Insufficient documentation

## 2022-12-06 LAB — IRON AND TIBC
Iron: 75 ug/dL (ref 45–182)
Saturation Ratios: 28 % (ref 17.9–39.5)
TIBC: 272 ug/dL (ref 250–450)
UIBC: 197 ug/dL

## 2022-12-06 LAB — FERRITIN: Ferritin: 346 ng/mL — ABNORMAL HIGH (ref 24–336)

## 2022-12-06 MED ORDER — DARBEPOETIN ALFA 40 MCG/0.4ML IJ SOSY
40.0000 ug | PREFILLED_SYRINGE | INTRAMUSCULAR | Status: DC
Start: 2022-12-06 — End: 2022-12-07
  Administered 2022-12-06: 40 ug via SUBCUTANEOUS

## 2022-12-06 MED ORDER — DARBEPOETIN ALFA 40 MCG/0.4ML IJ SOSY
PREFILLED_SYRINGE | INTRAMUSCULAR | Status: AC
  Filled 2022-12-06: qty 0.4

## 2022-12-09 LAB — POCT HEMOGLOBIN-HEMACUE: Hemoglobin: 9.4 g/dL — ABNORMAL LOW (ref 13.0–17.0)

## 2022-12-12 ENCOUNTER — Telehealth: Payer: Self-pay | Admitting: Physician Assistant

## 2022-12-12 MED ORDER — LOSARTAN POTASSIUM 25 MG PO TABS: 25.0000 mg | ORAL_TABLET | Freq: Every day | ORAL | 3 refills | Status: DC

## 2022-12-12 MED ORDER — CARVEDILOL 6.25 MG PO TABS: 6.2500 mg | ORAL_TABLET | Freq: Two times a day (BID) | ORAL | 3 refills | Status: DC

## 2022-12-12 NOTE — Telephone Encounter (Signed)
 *  STAT* If patient is at the pharmacy, call can be transferred to refill team.   1. Which medications need to be refilled? (please list name of each medication and dose if known)   carvedilol (COREG) 6.25 MG tablet    losartan (COZAAR) 25 MG tablet    2. Would you like to learn more about the convenience, safety, & potential cost savings by using the [facility]?    3. Are you open to using the [facility] (Type [facility].   4. Which pharmacy/location (including street and city if local pharmacy) is medication to be sent to? [facility] [phone] - [place], [place] - [address]    5. Do they need a 30 day or 90 day supply? 90 days  Pt said, his refill need to go to his local pharmacy. Please resend medication

## 2022-12-12 NOTE — Telephone Encounter (Signed)
Pt's medications were sent to pt's pharmacy as requested. Confirmation received.  

## 2022-12-13 ENCOUNTER — Other Ambulatory Visit: Payer: Self-pay

## 2022-12-13 MED ORDER — ATORVASTATIN CALCIUM 10 MG PO TABS: 10.0000 mg | ORAL_TABLET | Freq: Every day | ORAL | 3 refills | Status: DC

## 2022-12-17 ENCOUNTER — Encounter (HOSPITAL_COMMUNITY): Payer: Self-pay

## 2022-12-20 ENCOUNTER — Ambulatory Visit: Payer: Medicare HMO | Admitting: Physician Assistant

## 2022-12-20 ENCOUNTER — Encounter: Payer: Self-pay | Admitting: Physician Assistant

## 2022-12-20 ENCOUNTER — Ambulatory Visit (HOSPITAL_COMMUNITY)
Admission: RE | Admit: 2022-12-20 | Discharge: 2022-12-20 | Disposition: A | Discharge status: Home / Self Care | Payer: Medicare HMO | Source: Ambulatory Visit | Attending: Nephrology | Admitting: Nephrology

## 2022-12-20 VITALS — BP 140/84 | HR 72 | Temp 97.7°F | Resp 17

## 2022-12-20 VITALS — BP 125/82 | HR 86 | Resp 20 | Ht 70.0 in | Wt 148.0 lb

## 2022-12-20 DIAGNOSIS — Z862 Personal history of diseases of the blood and blood-forming organs and certain disorders involving the immune mechanism: Secondary | ICD-10-CM | POA: Insufficient documentation

## 2022-12-20 DIAGNOSIS — R413 Other amnesia: Secondary | ICD-10-CM | POA: Diagnosis not present

## 2022-12-20 DIAGNOSIS — N189 Chronic kidney disease, unspecified: Secondary | ICD-10-CM | POA: Insufficient documentation

## 2022-12-20 LAB — POCT HEMOGLOBIN-HEMACUE: Hemoglobin: 9.7 g/dL — ABNORMAL LOW (ref 13.0–17.0)

## 2022-12-20 MED ORDER — DARBEPOETIN ALFA 40 MCG/0.4ML IJ SOSY: 40.0000 ug | PREFILLED_SYRINGE | INTRAMUSCULAR | Status: DC

## 2022-12-20 MED ORDER — DARBEPOETIN ALFA 40 MCG/0.4ML IJ SOSY
PREFILLED_SYRINGE | INTRAMUSCULAR | Status: AC
  Administered 2022-12-20: 40 ug
  Filled 2022-12-20: qty 0.4

## 2022-12-20 NOTE — Progress Notes (Signed)
Assessment/Plan:   Memory impairment, concern for vascular etiology   Derek Blackburn is a very pleasant 87 y.o. RH male with a history of poorly controlled hypertension, hyperlipidemia,  DM2, symptomatic anemia requiring Aranesp injections, likely due to CKD stage IIIb, OSA on CPAP, history of prostate cancer, lumbar radiculopathy followed by orthopedics, C3-C4 moderate canal stenosis with some flattening of the ventral spinal cord followed with orthopedics, seen today in follow up for memory loss. Patient is currently on donepezil 10 mg daily. Memory is stable.  .     Follow up in  6 months. Continue donepezil 10 mg daily. Side effects were discussed  Patient is scheduled in December 2024 for neuropsych testing for clarity of diagnosis and disease trajectory   Recommend good control of her cardiovascular risk factors Continue to control anemia PCP Follow-up with orthopedics regarding chronic back pain and low back disease, and for C3-C4 moderate canal stenosis with some flattening of the ventral spinal cord, chronic injections Continue to control mood as per PCP     Subjective:    This patient is accompanied in the office by his wife  who supplements the history.  Previous records as well as any outside records available were reviewed prior to todays visit. Patient was last seen on 06/19/2022 with last MoCA of 18/30.    Any changes in memory since last visit? " About the same"  He does not like doing any brain activities, he likes reading and watching the news, sports on TV. repeats oneself?  Endorsed Disoriented when walking into a room?  Patient denies    Leaving objects?  May misplace things but not in unusual places   Wandering behavior?  denies   Any personality changes since last visit?  Denies.   Any worsening depression?:  Denies.   Hallucinations or paranoia?  Denies.   Seizures? denies    Any sleep changes?  Does not sleep well. Denies vivid dreams, REM behavior or  sleepwalking   Sleep apnea? Endorsed Uses CPAP for the last 2 days  Any hygiene concerns? Denies.  Independent of bathing and dressing?  Endorsed  Does the patient needs help with medications? Wife is in charge   Who is in charge of the finances?  Both patient and wife in charge     Any changes in appetite?  Denies.     Patient have trouble swallowing? Denies.   Does the patient cook? No Any headaches?   denies   Chronic back pain endorsed, he had a lower back pain radiating to both legs, chronic post laminectomy syndrome followed by orthopedics gives injections.    Ambulates with difficulty?  Endorsed, due to above, however [facility]  No longer pays tennis , trying to get into pickleball.  Uses a R cane for stability, sometimes a walker  Recent falls or head injuries? denies     Unilateral weakness, numbness or tingling? denies   Any tremors?  Denies   Any anosmia?  Denies   Any incontinence of urine?  Denies  Any bowel dysfunction?   Denies      Patient lives with wife Does the patient drive?  Yes, uses GPS when needed.  Denies getting disoriented.    Initial visit 05/20/2022 How long did patient have memory difficulties?  "Between 6-12 months ". He reports that his emory issues are vague, "I cannot pinpoint it, is kind of off". He does not do brain activities,  although he likes reading and watching the news and sports on TV.  His PCP placed him on donepezil 5 mg daily, tolerating well (since March of this year).  He also takes, cannot oil, B12 and fish oil repeats oneself?  Endorsed, more frequently than before  Disoriented when walking into a room?  Patient denies  Leaving objects in unusual places?   denies   Wandering behavior? denies   Any personality changes since last visit? "Not a great change" Any history of depression?:  denies   Hallucinations or paranoia?  denies   Seizures? denies    Any sleep changes?   He goes to sleep right after dinner .  Denies vivid dreams, REM behavior or sleepwalking   Sleep apnea? denies   Any hygiene concerns?  denies   Independent of bathing and dressing?  Endorsed  Does the patient need help with medications? Patient  is in charge   Who is in charge of the finances?  Both of them are  in charge     Any changes in appetite?   denies     Patient have trouble swallowing?  denies   Does the patient cook?  Wife does the cooking  Any headaches?  denies   Chronic back pain?  Endorsed.  He has significant bilateral lower back pain radiating to both legs, and chronic postlaminectomy syndrome, this is followed by orthopedics, and is scheduled for possible injections.  He is also doing PT. Ambulates with difficulty? [facility]. He no longer plays tennis due to back pain Recent falls or head injuries? denies     Vision changes? R decrease in vision, he attributes it to glaucoma Unilateral weakness, numbness or tingling?  denies   Any tremors?  Denies  Any anosmia? Denies Any incontinence of urine? denies   Any bowel dysfunction?    denies      Patient lives with his wife   History of heavy alcohol intake? denies   History of heavy tobacco use? denies   Family history of dementia?   Denies   Does patient drive?Yes, uses GPS when needed. Denies getting disoriented    Pertinent labs latest CBC hemoglobin 9-hematocrit 29.4. 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    Pertinent labs April 2024 TSH 0.4, B12 721, ferritin 416, iron and TIBC 60 and 244, PREVIOUS MEDICATIONS:   CURRENT MEDICATIONS:  Outpatient Encounter Medications as of 12/20/2022  Medication Sig   acetaminophen (TYLENOL) 650 MG CR tablet Take 650 mg by mouth at bedtime.   amLODipine (NORVASC) 10 MG tablet Take 1 tablet (10 mg total) by mouth daily.   apixaban (ELIQUIS) 5 MG TABS tablet Take 1 tablet (5 mg total) by mouth 2 (two) times daily.   Apoaequorin (PREVAGEN) 10 MG CAPS Take 10 mg by mouth daily.   atorvastatin (LIPITOR) 10 MG tablet Take 1 tablet (10 mg total) by mouth daily.   Calcium Carbonate (CALCIUM 500 PO) Take 500 mg by mouth daily.   carvedilol (COREG) 6.25 MG  tablet Take 1 tablet (6.25 mg total) by mouth 2 (two) times daily.   darolutamide (NUBEQA) 300 MG tablet Take 600 mg by mouth 2 (two) times daily with a meal. This medication treats Prostate Cancer.   diclofenac Sodium (VOLTAREN) 1 % GEL Apply 2 g topically 4 (four) times daily.   donepezil (ARICEPT) 10 MG tablet Take 1 tablet (10 mg total) by mouth daily.   hydrALAZINE (APRESOLINE) 25 MG tablet Take 3 tablets (75 mg total) by mouth 3 (three) times daily.   latanoprost (XALATAN) 0.005 % ophthalmic solution Place 1 drop into both eyes at bedtime.   leflunomide (ARAVA) 20 MG tablet Take 20 mg by mouth daily.   loratadine (CLARITIN) 10 MG tablet Take 10 mg by mouth daily.   losartan (COZAAR) 25 MG tablet Take 1 tablet (25 mg total) by mouth daily.   Multiple Vitamin (MULITIVITAMIN WITH MINERALS) TABS Take 1 tablet by mouth daily.   Omega-3 Fatty Acids (FISH OIL) 1200 MG CAPS Take 1,200 mg by mouth daily.   ondansetron (ZOFRAN-ODT) 8 MG disintegrating tablet Take 1 tablet (8 mg total) by mouth every 8 (eight) hours as needed for nausea or vomiting.   timolol (TIMOPTIC) 0.5 % ophthalmic solution Place 1 drop into both eyes 2 (two) times daily.   Tiotropium Bromide-Olodaterol (STIOLTO RESPIMAT)  2.5-2.5 MCG/ACT AERS Inhale 2 puffs into the lungs daily.   vitamin B-12 (CYANOCOBALAMIN) 100 MCG tablet Take 100 mcg by mouth daily.   VITAMIN D, CHOLECALCIFEROL, PO Take 1 tablet by mouth daily.   JARDIANCE 10 MG TABS tablet Take 10 mg by mouth daily. (Patient not taking: Reported on 12/20/2022)   No facility-administered encounter medications on file as of 12/20/2022.        No data to display            05/20/2022   11:00 AM  Montreal Cognitive Assessment   Visuospatial/ Executive (0/5) 1  Naming (0/3) 3  Attention: Read list of digits (0/2) 2  Attention: Read list of letters (0/1) 1  Attention: Serial 7 subtraction starting at 100 (0/3) 1  Language: Repeat phrase (0/2) 2  Language : Fluency (0/1) 0  Abstraction (0/2) 2  Delayed Recall (0/5) 0  Orientation (0/6) 6  Total 18  Adjusted Score (based on education) 18    Objective:     PHYSICAL EXAMINATION:    VITALS:   Vitals:   12/20/22 1120  BP: 125/82  Pulse: 86  Resp: 20  SpO2: 98%  Weight: 148 lb (67.1 kg)  Height: 5\' 10"  (1.778 m)    GEN:  The patient appears stated age and is in NAD. HEENT:  Normocephalic, atraumatic.   Neurological examination:  General: NAD, well-groomed, appears stated age. Orientation: The patient is alert. Oriented to person, place and date Cranial nerves: There is good facial symmetry.The speech is fluent and clear. No aphasia or dysarthria. Fund of knowledge is appropriate. Recent and remote memory are impaired. Attention and concentration are reduced.  Able to name objects and repeat phrases.  Hearing is intact to conversational tone.   Sensation: Sensation is intact to light touch throughout Motor: Strength is at least antigravity x4. DTR's 2/4 in UE/LE     Movement examination: Tone: There is normal tone in the UE/LE Abnormal movements:  no tremor.  No myoclonus.  No asterixis.   Coordination:  There is  decremation with RAM's. Normal finger to nose  Gait and Station:  The patient has some difficulty arising out of  a deep-seated chair without the use of the hands. He does complain of back pain. He does forward flex and mildly leans to the R.  The patient's stride length is good.  Gait is cautious and narrow.    Thank you for allowing [place] the opportunity to participate in the care of this nice patient. Please do not hesitate to contact us for any questions or concerns.   Total time spent on today's visit was 39 minutes dedicated to this patient today, preparing to see patient, examining the patient, ordering tests and/or medications and counseling the patient, documenting clinical information in the EHR or other health record, independently interpreting results and communicating results to the patient/family, discussing treatment and goals, answering patient's questions and coordinating care.  Cc:  Diamantina Providence, FNP  Marlowe Kays 12/20/2022 11:37 AM

## 2022-12-20 NOTE — Patient Instructions (Addendum)
It was a pleasure to see you today at our office.   Recommendations:  Neurocognitive evaluation at our office    Continue taking donepezil 10  mg daily  Talk to your cardiology about your Blood pressure  Continue Eliquis  Follow up in 6 months  Referral to your orthopedic surgeon for neck abnormalities.      For assessment of decision of mental capacity and competency:  Call Dr. Erick Blinks, geriatric psychiatrist at [phone] Counseling regarding caregiver distress, including caregiver depression, anxiety and issues regarding community resources, adult day care programs, adult living facilities, or memory care questions:  please contact your  Primary Doctor's Social Worker  Whom to call: Memory  decline, memory medications: Call our office [phone]  For psychiatric meds, mood meds: Please have your primary care physician manage these medications.  If you have any severe symptoms of a stroke, or other severe issues such as confusion,severe chills or fever, etc call 911 or go to the ER as you may need to be evaluated further    RECOMMENDATIONS FOR ALL PATIENTS WITH MEMORY PROBLEMS: 1. Continue to exercise (Recommend 30 minutes of walking everyday, or 3 hours every week) 2. Increase social interactions - continue going to [place] and enjoy social gatherings with friends and family 3. Eat healthy, avoid fried foods and eat more fruits and vegetables 4. Maintain adequate blood pressure, blood sugar, and blood cholesterol level. Reducing the risk of stroke and cardiovascular disease also helps promoting better memory. 5. Avoid stressful situations. Live a simple life and avoid aggravations. Organize your time and prepare for the next day in anticipation. 6. Sleep well, avoid any interruptions of sleep and avoid any distractions in the bedroom that may interfere with adequate sleep quality 7. Avoid sugar, avoid sweets as there is a strong link between excessive sugar intake, diabetes,  and cognitive impairment We discussed the Mediterranean diet, which has been shown to help patients reduce the risk of progressive memory disorders and reduces cardiovascular risk. This includes eating fish, eat fruits and green leafy vegetables, nuts like almonds and hazelnuts, walnuts, and also use olive oil. Avoid fast foods and fried foods as much as possible. Avoid sweets and sugar as sugar use has been linked to worsening of memory function.  There is always a concern of gradual progression of memory problems. If this is the case, then we may need to adjust level of care according to patient needs. Support, both to the patient and caregiver, should then be put into place.      You have been referred for a neuropsychological evaluation (i.e., evaluation of memory and thinking abilities). Please bring someone with you to this appointment if possible, as it is helpful for the doctor to hear from both you and another adult who knows you well. Please bring eyeglasses and hearing aids if you wear them.    The evaluation will take approximately 3 hours and has two parts:   The first part is a clinical interview with the neuropsychologist (Dr. Milbert Coulter or Dr. Roseanne Reno). During the interview, the neuropsychologist will speak with you and the individual you brought to the appointment.    The second part of the evaluation is testing with the doctor's technician Annabelle Harman or Selena Batten). During the testing, the technician will ask you to remember different types of material, solve problems, and answer some questionnaires. Your family member will not be present for this portion of the evaluation.   Please note: We must reserve several hours of  the neuropsychologist's time and the psychometrician's time for your evaluation appointment. As such, there is a No-Show fee of $100. If you are unable to attend any of your appointments, please contact our office as soon as possible to reschedule.    FALL PRECAUTIONS: Be cautious  when walking. Scan the area for obstacles that may increase the risk of trips and falls. When getting up in the mornings, sit up at the edge of the bed for a few minutes before getting out of bed. Consider elevating the bed at the head end to avoid drop of blood pressure when getting up. Walk always in a well-lit room (use night lights in the walls). Avoid area rugs or power cords from appliances in the middle of the walkways. Use a walker or a cane if necessary and consider physical therapy for balance exercise. Get your eyesight checked regularly.  FINANCIAL OVERSIGHT: Supervision, especially oversight when making financial decisions or transactions is also recommended.  HOME SAFETY: Consider the safety of the kitchen when operating appliances like stoves, microwave oven, and blender. Consider having supervision and share cooking responsibilities until no longer able to participate in those. Accidents with firearms and other hazards in the house should be identified and addressed as well.   ABILITY TO BE LEFT ALONE: If patient is unable to contact 911 operator, consider using LifeLine, or when the need is there, arrange for someone to stay with patients. Smoking is a fire hazard, consider supervision or cessation. Risk of wandering should be assessed by caregiver and if detected at any point, supervision and safe proof recommendations should be instituted.  MEDICATION SUPERVISION: Inability to self-administer medication needs to be constantly addressed. Implement a mechanism to ensure safe administration of the medications.   DRIVING: Regarding driving, in patients with progressive memory problems, driving will be impaired. We advise to have someone else do the driving if trouble finding directions or if minor accidents are reported. Independent driving assessment is available to determine safety of driving.   If you are interested in the driving assessment, you can contact the following:  [facility]  [employer] in [place] [phone]  [facility] [phone]  [facility] [phone] or [phone]    Mediterranean Diet A Mediterranean diet refers to food and lifestyle choices that are based on the traditions of countries located on the [employer]. This way of eating has been shown to help prevent certain conditions and improve outcomes for people who have chronic diseases, like kidney disease and heart disease. What are tips for following this plan? Lifestyle  Cook and eat meals together with your family, when possible. Drink enough fluid to keep your urine clear or pale yellow. Be physically active every day. This includes: Aerobic exercise like running or swimming. Leisure activities like gardening, walking, or housework. Get 7-8 hours of sleep each night. If recommended by your health care provider, drink red wine in moderation. This means 1 glass a day for nonpregnant women and 2 glasses a day for men. A glass of wine equals 5 oz (150 mL). Reading food labels  Check the serving size of packaged foods. For foods such as rice and pasta, the serving size refers to the amount of cooked product, not dry. Check the total fat in packaged foods. Avoid foods that have saturated fat or trans fats. Check the ingredients list for added sugars, such as corn syrup. Shopping  At the grocery store, buy most of your food from the  areas near the walls of the store. This includes: Fresh fruits and vegetables (produce). Grains, beans, nuts, and seeds. Some of these may be available in unpackaged forms or large amounts (in bulk). Fresh seafood. Poultry and eggs. Low-fat dairy products. Buy whole ingredients instead of prepackaged foods. Buy fresh fruits and vegetables in-season from local farmers markets. Buy frozen fruits and vegetables in resealable bags. If you do not have access to quality fresh seafood, buy  precooked frozen shrimp or canned fish, such as tuna, salmon, or sardines. Buy small amounts of raw or cooked vegetables, salads, or olives from the deli or salad bar at your store. Stock your pantry so you always have certain foods on hand, such as olive oil, canned tuna, canned tomatoes, rice, pasta, and beans. Cooking  Cook foods with extra-virgin olive oil instead of using butter or other vegetable oils. Have meat as a side dish, and have vegetables or grains as your main dish. This means having meat in small portions or adding small amounts of meat to foods like pasta or stew. Use beans or vegetables instead of meat in common dishes like chili or lasagna. Experiment with different cooking methods. Try roasting or broiling vegetables instead of steaming or sauteing them. Add frozen vegetables to soups, stews, pasta, or rice. Add nuts or seeds for added healthy fat at each meal. You can add these to yogurt, salads, or vegetable dishes. Marinate fish or vegetables using olive oil, lemon juice, garlic, and fresh herbs. Meal planning  Plan to eat 1 vegetarian meal one day each week. Try to work up to 2 vegetarian meals, if possible. Eat seafood 2 or more times a week. Have healthy snacks readily available, such as: Vegetable sticks with hummus. Greek yogurt. Fruit and nut trail mix. Eat balanced meals throughout the week. This includes: Fruit: 2-3 servings a day Vegetables: 4-5 servings a day Low-fat dairy: 2 servings a day Fish, poultry, or lean meat: 1 serving a day Beans and legumes: 2 or more servings a week Nuts and seeds: 1-2 servings a day Whole grains: 6-8 servings a day Extra-virgin olive oil: 3-4 servings a day Limit red meat and sweets to only a few servings a month What are my food choices? Mediterranean diet Recommended Grains: Whole-grain pasta. Brown rice. Bulgar wheat. Polenta. Couscous. Whole-wheat bread. Orpah Cobb. Vegetables: Artichokes. Beets. Broccoli.  Cabbage. Carrots. Eggplant. Green beans. Chard. Kale. Spinach. Onions. Leeks. Peas. Squash. Tomatoes. Peppers. Radishes. Fruits: Apples. Apricots. Avocado. Berries. Bananas. Cherries. Dates. Figs. Grapes. Lemons. Melon. Oranges. Peaches. Plums. Pomegranate. Meats and other protein foods: Beans. Almonds. Sunflower seeds. Pine nuts. Peanuts. Cod. Salmon. Scallops. Shrimp. Tuna. Tilapia. Clams. Oysters. Eggs. Dairy: Low-fat milk. Cheese. Greek yogurt. Beverages: Water. Red wine. Herbal tea. Fats and oils: Extra virgin olive oil. Avocado oil. Grape seed oil. Sweets and desserts: [place] yogurt with honey. Baked apples. Poached pears. Trail mix. Seasoning and other foods: Basil. Cilantro. Coriander. Cumin. Mint. Parsley. Sage. Rosemary. Tarragon. Garlic. Oregano. Thyme. Pepper. Balsalmic vinegar. Tahini. Hummus. Tomato sauce. Olives. Mushrooms. Limit these Grains: Prepackaged pasta or rice dishes. Prepackaged cereal with added sugar. Vegetables: Deep fried potatoes (french fries). Fruits: Fruit canned in syrup. Meats and other protein foods: Beef. Pork. Lamb. Poultry with skin. Hot dogs. Tomasa Blase. Dairy: Ice cream. Sour cream. Whole milk. Beverages: Juice. Sugar-sweetened soft drinks. Beer. Liquor and spirits. Fats and oils: Butter. Canola oil. Vegetable oil. Beef fat (tallow). Lard. Sweets and desserts: Cookies. Cakes. Pies. Candy. Seasoning and other foods: Mayonnaise. Premade sauces and  marinades. The items listed may not be a complete list. Talk with your dietitian about what dietary choices are right for you. Summary The Mediterranean diet includes both food and lifestyle choices. Eat a variety of fresh fruits and vegetables, beans, nuts, seeds, and whole grains. Limit the amount of red meat and sweets that you eat. Talk with your health care provider about whether it is safe for you to drink red wine in moderation. This means 1 glass a day for nonpregnant women and 2 glasses a day for men. A glass  of wine equals 5 oz (150 mL). This information is not intended to replace advice given to you by your health care provider. Make sure you discuss any questions you have with your health care provider. Document Released: 09/07/2015 Document Revised: 10/10/2015 Document Reviewed: 09/07/2015 Elsevier Interactive Patient Education  2017 [employer].

## 2022-12-21 DIAGNOSIS — I2729 Other secondary pulmonary hypertension: Secondary | ICD-10-CM

## 2022-12-22 ENCOUNTER — Encounter: Payer: Self-pay | Admitting: Physician Assistant

## 2022-12-24 NOTE — Addendum Note (Signed)
Addended by: Marlowe Kays E on: 12/24/2022 05:27 PM   Modules accepted: Level of Service

## 2023-01-01 ENCOUNTER — Encounter: Payer: Self-pay | Admitting: Pulmonary Disease

## 2023-01-01 ENCOUNTER — Ambulatory Visit: Payer: Medicare HMO | Admitting: Pulmonary Disease

## 2023-01-01 VITALS — BP 100/64 | HR 76 | Temp 97.9°F | Ht 70.0 in | Wt 146.0 lb

## 2023-01-01 DIAGNOSIS — R0609 Other forms of dyspnea: Secondary | ICD-10-CM

## 2023-01-01 DIAGNOSIS — I2609 Other pulmonary embolism with acute cor pulmonale: Secondary | ICD-10-CM

## 2023-01-01 DIAGNOSIS — I272 Pulmonary hypertension, unspecified: Secondary | ICD-10-CM

## 2023-01-01 MED ORDER — APIXABAN 5 MG PO TABS: 5.0000 mg | ORAL_TABLET | Freq: Two times a day (BID) | ORAL | 2 refills | Status: DC

## 2023-01-01 NOTE — Patient Instructions (Signed)
Full PFT performed today. °

## 2023-01-01 NOTE — Progress Notes (Signed)
Full PFT performed today. °

## 2023-01-01 NOTE — Patient Instructions (Signed)
Please call the office on the letter for the echocardiogram heart ultrasound  This is very important to make sure the stress on the heart has improved after the blood clot  Continue Eliquis 5 mg twice a day, new refill today.  I spoke with one of the sleep doctors were looking into the results of your sleep study I will be in touch as soon as we get it  Return to clinic in 3 months or sooner as needed with Dr. Judeth Horn

## 2023-01-01 NOTE — Progress Notes (Signed)
@Patient  ID: Derek Blackburn, male    DOB: Jun 19, 1935, 87 y.o.   MRN: [id number]  Chief Complaint  Patient presents with   Follow-up    Review PFT from today.    Referring provider: Diamantina Providence, FNP  HPI:   87 y.o.   man whom we are seeing for evaluation of dyspnea on exertion.  Most recent cardiology note reviewed.  Returns for routine follow-up.  Echocardiogram ordered last visit not scheduled.  Sleep study done but not resulted.  PFTs today.  These consistent with restriction but via nitrogen washout, moderate restriction, likely underestimates.  In addition DLCO is moderately reduced.  Reviewed CT scan 08/2022, no signs of ILD.  Discussed continuing Eliquis.  Importance of getting echocardiogram to assess for stress on the right heart.   HPI at initial visit: Dyspnea on exertion present for a few weeks likely wife states it sounds like it is dated relatively acutely.  She remembers at night in July they were at dinner.  No issues walking and negative.  At walking out of dinner he was quite dyspneic and labored.  He was hospitalized and seen in the ED several times in July.  First admitted for chest pain.  On send ambulating Supple out and seemed fine.  At that time he underwent left and right heart cath that showed no significant coronary disease but a PVR of 4, mean PA pressure of 32 LVEDP of 7.  Cardiac index and output were preserved.  He was hospitalized again for high blood pressure.  Then in August hospitalized again for shortness of breath.  He has CKD so note CTA protocol was performed with a VQ scan with high probability for multiple areas of pulmonary embolism.  He was placed on anticoagulation.  Dyspnea still persists.  Not really any better.  We discussed role and rationale for further evaluation of this.  As well as evaluation of possible causes of pulmonary hypertension.  Certainly pulmonary tension could be contributing to his symptoms.  Reviewed most recent chest  x-ray 08/2022 that looks clear on my review interpretation.  CT chest without contrast 08/2022 showed some scattered areas of focal fibrosis likely postinflammatory on my review and interpretation, otherwise clear lungs.   Questionaires / Pulmonary Flowsheets:   ACT:      No data to display          MMRC:     No data to display          Epworth:      No data to display          Tests:   FENO:  No results found for: "NITRICOXIDE"  PFT:     No data to display          WALK:      No data to display          Imaging: Personally reviewed as per EMR and discussion in this note No results found.  Lab Results: Personally reviewed CBC    Component Value Date/Time   WBC 8.4 10/25/2022 2137   RBC 3.34 (L) 10/25/2022 2137   HGB 9.7 (L) 12/20/2022 1307   HCT 32.5 (L) 10/25/2022 2137   PLT 269 10/25/2022 2137   MCV 97.3 10/25/2022 2137   MCH 29.3 10/25/2022 2137   MCHC 30.2 10/25/2022 2137   RDW 14.2 10/25/2022 2137   LYMPHSABS 0.9 10/25/2022 2137   MONOABS 0.4 10/25/2022 2137   EOSABS 0.4 10/25/2022 2137   BASOSABS 0.1  10/25/2022 2137    BMET    Component Value Date/Time   NA 140 10/25/2022 2137   K 4.1 10/25/2022 2137   CL 110 10/25/2022 2137   CO2 19 (L) 10/25/2022 2137   GLUCOSE 142 (H) 10/25/2022 2137   BUN 35 (H) 10/25/2022 2137   CREATININE 2.21 (H) 10/25/2022 2137   CALCIUM 9.7 10/25/2022 2137   GFRNONAA 28 (L) 10/25/2022 2137   GFRAA 61 (L) 01/21/2011 1720    BNP    Component Value Date/Time   BNP 1,767.1 (H) 09/03/2022 1519    ProBNP No results found for: "PROBNP"  Specialty Problems       Pulmonary Problems   Dyspnea on exertion    No Known Allergies  Immunization History  Administered Date(s) Administered   Tdap 10/01/2020    Past Medical History:  Diagnosis Date   Anemia    CKD (chronic kidney disease)    Heart murmur    Hypertension    Hypertensive urgency    Peripheral polyneuropathy    Prostate  cancer (HCC)    PVD (peripheral vascular disease) (HCC)    Rheumatoid arthritis involving multiple sites with positive rheumatoid factor (HCC) 08/13/2022    Tobacco History: Social History   Tobacco Use  Smoking Status Former   Types: Cigarettes  Smokeless Tobacco Never  Tobacco Comments   Patient states he smokes in the 1960's for about 4 months.  Smoked about half a pack a day.  Updated 10/08/2022 am   Counseling given: Not Answered Tobacco comments: Patient states he smokes in the 1960's for about 4 months.  Smoked about half a pack a day.  Updated 10/08/2022 am   Continue to not smoke  Outpatient Encounter Medications as of 01/01/2023  Medication Sig   acetaminophen (TYLENOL) 650 MG CR tablet Take 650 mg by mouth at bedtime.   amLODipine (NORVASC) 10 MG tablet Take 1 tablet (10 mg total) by mouth daily.   apixaban (ELIQUIS) 5 MG TABS tablet Take 1 tablet (5 mg total) by mouth 2 (two) times daily.   Apoaequorin (PREVAGEN) 10 MG CAPS Take 10 mg by mouth daily.   atorvastatin (LIPITOR) 10 MG tablet Take 1 tablet (10 mg total) by mouth daily.   Calcium Carbonate (CALCIUM 500 PO) Take 500 mg by mouth daily.   carvedilol (COREG) 6.25 MG tablet Take 1 tablet (6.25 mg total) by mouth 2 (two) times daily.   darolutamide (NUBEQA) 300 MG tablet Take 600 mg by mouth 2 (two) times daily with a meal. This medication treats Prostate Cancer.   diclofenac Sodium (VOLTAREN) 1 % GEL Apply 2 g topically 4 (four) times daily.   donepezil (ARICEPT) 10 MG tablet Take 1 tablet (10 mg total) by mouth daily.   hydrALAZINE (APRESOLINE) 25 MG tablet Take 3 tablets (75 mg total) by mouth 3 (three) times daily.   JARDIANCE 10 MG TABS tablet Take 10 mg by mouth daily. (Patient not taking: Reported on 12/20/2022)   latanoprost (XALATAN) 0.005 % ophthalmic solution Place 1 drop into both eyes at bedtime.   leflunomide (ARAVA) 20 MG tablet Take 20 mg by mouth daily.   loratadine (CLARITIN) 10 MG tablet Take 10  mg by mouth daily.   losartan (COZAAR) 25 MG tablet Take 1 tablet (25 mg total) by mouth daily.   Multiple Vitamin (MULITIVITAMIN WITH MINERALS) TABS Take 1 tablet by mouth daily.   Omega-3 Fatty Acids (FISH OIL) 1200 MG CAPS Take 1,200 mg by mouth daily.   ondansetron (  ZOFRAN-ODT) 8 MG disintegrating tablet Take 1 tablet (8 mg total) by mouth every 8 (eight) hours as needed for nausea or vomiting.   timolol (TIMOPTIC) 0.5 % ophthalmic solution Place 1 drop into both eyes 2 (two) times daily.   Tiotropium Bromide-Olodaterol (STIOLTO RESPIMAT) 2.5-2.5 MCG/ACT AERS Inhale 2 puffs into the lungs daily.   vitamin B-12 (CYANOCOBALAMIN) 100 MCG tablet Take 100 mcg by mouth daily.   VITAMIN D, CHOLECALCIFEROL, PO Take 1 tablet by mouth daily.   [DISCONTINUED] apixaban (ELIQUIS) 5 MG TABS tablet Take 1 tablet (5 mg total) by mouth 2 (two) times daily.   No facility-administered encounter medications on file as of 01/01/2023.     Review of Systems  Review of Systems  N/a Physical Exam  BP 100/64 (BP Location: Right Arm, Patient Position: Sitting, Cuff Size: Normal)   Pulse 76   Temp 97.9 F (36.6 C) (Oral)   Ht 5\' 10"  (1.778 m)   Wt 146 lb (66.2 kg)   SpO2 94%   BMI 20.95 kg/m   Wt Readings from Last 5 Encounters:  01/01/23 146 lb (66.2 kg)  12/20/22 148 lb (67.1 kg)  12/02/22 143 lb 12.8 oz (65.2 kg)  10/25/22 160 lb (72.6 kg)  10/08/22 147 lb 6.4 oz (66.9 kg)    BMI Readings from Last 5 Encounters:  01/01/23 20.95 kg/m  12/20/22 21.24 kg/m  12/02/22 20.63 kg/m  10/25/22 22.96 kg/m  10/08/22 21.15 kg/m     Physical Exam General: Sitting in chair, no acute distress Eyes: EOMI, no icterus Neck: Supple, no JVP Pulmonary: Clear, normal work of breathing Cardiovascular: Warm, regular rate and rhythm Abdomen: Nondistended, bowel sounds present MSK: No synovitis, no joint effusion Neuro: Ambulates with assistance of cane, no focal deficits Psych: Normal mood, full  affect.  Assessment & Plan:   Pulmonary hypertension: Presumed Group 1 PAH given elevated PVR and normal wedge pressure/LVEDP.  Also at risk for group 4 disease given acute blood clot.  However signs of pulm hypertension on echocardiogram preceded this.  He has aortic valve disease.  This essentially contraindicates PDE 5.  Quite possible there is only group 2 disease despite normal LVEDP.  Frankly, given his age and otherwise mild symptoms, do not think you will benefit from pulmonary vasodilators.  Will assess for sleep apnea and see if this is a possible reversible or treatable cause of pulmonary hypertension he does not require pulmonary vasodilators.  Sleep study performed but not yet released for interpretation.  Submassive PE: Right-sided changes somewhat chronic but seem worse after PE.  Repeat TTE at 26-month interval, 11/2022 ordered but never scheduled.  Encouraged to schedule so we can evaluate.  He is a poor candidate for pulmonary vasodilators given frailty.  Dyspnea on exertion: Likely related to the above.  PFTs with restriction.  Likely weakness, frailty.  No ILD on CT scan 08/2022.   Return in about 3 months (around 04/01/2023) for f/u Dr. Judeth Horn.   Karren Burly, MD 01/01/2023

## 2023-01-03 ENCOUNTER — Encounter (HOSPITAL_COMMUNITY)
Admission: RE | Admit: 2023-01-03 | Discharge: 2023-01-03 | Disposition: A | Discharge status: Home / Self Care | Payer: Medicare HMO | Source: Ambulatory Visit | Attending: Nephrology | Admitting: Nephrology

## 2023-01-03 VITALS — BP 143/75 | HR 71 | Temp 97.7°F | Resp 17

## 2023-01-03 DIAGNOSIS — Z862 Personal history of diseases of the blood and blood-forming organs and certain disorders involving the immune mechanism: Secondary | ICD-10-CM | POA: Insufficient documentation

## 2023-01-03 DIAGNOSIS — N189 Chronic kidney disease, unspecified: Secondary | ICD-10-CM | POA: Diagnosis present

## 2023-01-03 LAB — IRON AND TIBC
Iron: 101 ug/dL (ref 45–182)
Saturation Ratios: 36 % (ref 17.9–39.5)
TIBC: 284 ug/dL (ref 250–450)
UIBC: 183 ug/dL

## 2023-01-03 LAB — POCT HEMOGLOBIN-HEMACUE: Hemoglobin: 9.6 g/dL — ABNORMAL LOW (ref 13.0–17.0)

## 2023-01-03 LAB — FERRITIN: Ferritin: 275 ng/mL (ref 24–336)

## 2023-01-03 MED ORDER — DARBEPOETIN ALFA 40 MCG/0.4ML IJ SOSY
PREFILLED_SYRINGE | INTRAMUSCULAR | Status: AC
  Filled 2023-01-03: qty 0.4

## 2023-01-03 MED ORDER — DARBEPOETIN ALFA 40 MCG/0.4ML IJ SOSY
40.0000 ug | PREFILLED_SYRINGE | INTRAMUSCULAR | Status: DC
  Administered 2023-01-03: 40 ug via SUBCUTANEOUS

## 2023-01-04 ENCOUNTER — Telehealth: Payer: Self-pay | Admitting: Pulmonary Disease

## 2023-01-04 NOTE — Telephone Encounter (Signed)
Call patient  Sleep study result  Date of study: 12/21/2022  Impression: Severe obstructive sleep apnea with moderate oxygen desaturations  Recommendation: DME referral  Recommend CPAP therapy for severe obstructive sleep apnea  Auto titrating CPAP with pressure settings of 5-20 will be appropriate  Encourage weight loss measures  Follow-up in the office 4 to 6 weeks following initiation of treatment  Consider overnight oximetry with CPAP in place once patient's is used to using CPAP to ascertain adequate oxygenation

## 2023-01-06 ENCOUNTER — Telehealth: Payer: Self-pay | Admitting: Cardiovascular Disease

## 2023-01-06 NOTE — Telephone Encounter (Signed)
Pt's wife returning call that she received about 10 minutes ago. Please advise

## 2023-01-06 NOTE — Telephone Encounter (Signed)
I spoke w the patient's wife.  She said they missed a call from [facility] and thought it was from [place].  Thought they needed to schedule an echo.  Adv last echo was in Aug, and that from a heart perspective another one is not needed until next August.  She asked me to forward the echo report to the Dr. Judeth Horn in pulmonary which I have done.  Nothing further needed at this time.

## 2023-01-08 ENCOUNTER — Telehealth (HOSPITAL_COMMUNITY): Payer: Self-pay | Admitting: Radiology

## 2023-01-08 DIAGNOSIS — I2609 Other pulmonary embolism with acute cor pulmonale: Secondary | ICD-10-CM

## 2023-01-08 NOTE — Telephone Encounter (Signed)
New order placed, thanks.

## 2023-01-08 NOTE — Addendum Note (Signed)
Addended byVilma Meckel on: 01/08/2023 02:47 PM   Modules accepted: Orders

## 2023-01-08 NOTE — Telephone Encounter (Signed)
Patient called to schedule echocardiogram. Informed patient PA# has expired. Patient will need to contact Dr. Judeth Horn for a new PA# and order.

## 2023-01-13 ENCOUNTER — Encounter: Payer: Self-pay | Admitting: Psychology

## 2023-01-13 DIAGNOSIS — M549 Dorsalgia, unspecified: Secondary | ICD-10-CM | POA: Insufficient documentation

## 2023-01-13 DIAGNOSIS — M2012 Hallux valgus (acquired), left foot: Secondary | ICD-10-CM | POA: Insufficient documentation

## 2023-01-13 DIAGNOSIS — G619 Inflammatory polyneuropathy, unspecified: Secondary | ICD-10-CM | POA: Insufficient documentation

## 2023-01-13 DIAGNOSIS — M2011 Hallux valgus (acquired), right foot: Secondary | ICD-10-CM | POA: Insufficient documentation

## 2023-01-14 ENCOUNTER — Ambulatory Visit: Payer: Medicare HMO | Admitting: Psychology

## 2023-01-14 ENCOUNTER — Encounter: Payer: Self-pay | Admitting: Psychology

## 2023-01-14 DIAGNOSIS — R4189 Other symptoms and signs involving cognitive functions and awareness: Secondary | ICD-10-CM

## 2023-01-14 DIAGNOSIS — G309 Alzheimer's disease, unspecified: Secondary | ICD-10-CM

## 2023-01-14 DIAGNOSIS — I6381 Other cerebral infarction due to occlusion or stenosis of small artery: Secondary | ICD-10-CM | POA: Insufficient documentation

## 2023-01-14 DIAGNOSIS — I679 Cerebrovascular disease, unspecified: Secondary | ICD-10-CM | POA: Insufficient documentation

## 2023-01-14 DIAGNOSIS — F0150 Vascular dementia without behavioral disturbance: Secondary | ICD-10-CM | POA: Diagnosis not present

## 2023-01-14 DIAGNOSIS — F0280 Dementia in other diseases classified elsewhere without behavioral disturbance: Secondary | ICD-10-CM

## 2023-01-14 HISTORY — DX: Vascular dementia, unspecified severity, without behavioral disturbance, psychotic disturbance, mood disturbance, and anxiety: F01.50

## 2023-01-14 NOTE — Progress Notes (Signed)
   Psychometrician Note   Cognitive testing was administered to [name] by Derek Blackburn, B.S. (psychometrist) under the supervision of Dr. Newman Nickels, Ph.D., licensed psychologist on 01/14/2023. Derek Blackburn did not appear overtly distressed by the testing session per behavioral observation or responses across self-report questionnaires. Rest breaks were offered.    The battery of tests administered was selected by Dr. Newman Nickels, Ph.D. with consideration to Derek Blackburn's current level of functioning, the nature of his symptoms, emotional and behavioral responses during interview, level of literacy, observed level of motivation/effort, and the nature of the referral question. This battery was communicated to the psychometrist. Communication between Dr. Newman Nickels, Ph.D. and the psychometrist was ongoing throughout the evaluation and Dr. Newman Nickels, Ph.D. was immediately accessible at all times. Dr. Newman Nickels, Ph.D. provided supervision to the psychometrist on the date of this service to the extent necessary to assure the quality of all services provided.    [name] will return within approximately 1-2 weeks for an interactive feedback session with Dr. Milbert Coulter at which time his test performances, clinical impressions, and treatment recommendations will be reviewed in detail. Derek Blackburn understands he can contact our office should he require our assistance before this time.  A total of 125 minutes of billable time were spent face-to-face with Derek Blackburn by the psychometrist. This includes both test administration and scoring time. Billing for these services is reflected in the clinical report generated by Dr. Newman Nickels, Ph.D.  This note reflects time spent with the psychometrician and does not include test scores or any clinical interpretations made by Dr. Milbert Coulter. The full report will follow in a separate note.

## 2023-01-14 NOTE — Progress Notes (Signed)
NEUROPSYCHOLOGICAL EVALUATION [place] Department of Neurology  Date of Evaluation: January 14, 2023  Reason for Referral:   Derek Blackburn is a 87 y.o. right-handed African-American male referred by Derek Kays, PA-C, to characterize his current cognitive functioning and assist with diagnostic clarity and treatment planning in the context of subjective cognitive decline.   Assessment and Plan:   Clinical Impression(s): Derek Blackburn's pattern of performance is suggestive of fairly diffuse and often quite severe cognitive impairment. Normative impairments were exhibited across processing speed, cognitive flexibility, semantic fluency, confrontation naming, visuospatial abilities, and all aspects of learning and memory. Performances were appropriate relative to age-matched peers across basic attention/concentration and phonemic fluency. Functionally, Derek Blackburn continues to drive without reported issue. However, his wife has fully taken over medication management, financial management, and bill paying responsibilities. Given the extent of cognitive impairment and the likelihood that this is directly interfering with activities of daily living to some degree, Derek Blackburn best meets diagnostic criteria for a Major Neurocognitive Disorder ("dementia") at the present time.  The cause for his dementia presentation is likely multifactorial in nature. His most recent brain MRI revealed advanced microvascular ischemic disease, multiple lacunar infarcts within the bilateral deep gray nuclei/internal capsules, multiple chronic infarcts within the right and left cerebellar hemispheres, and chronic parenchymal microhemorrhages with a thalamic and cerebellar predominance. The presence of cerebrovascular disease and numerous strokes across different brain regions can certainly explain some degree of current impairment and I do believe that these findings are playing an  active role in his current clinical presentation.  I do also have concern for an underlying neurodegenerative illness in addition to significant vascular contributions (i.e., a mixed dementia presentation). Across memory testing, Derek Blackburn was fully amnestic (i.e., 0% retention) across both verbal tasks and only exhibited 17% retention across a visual task. He also performed very poorly across yes/no recognition trials. Taken together, this suggests evidence for rapid forgetting and an evolving and already quite prominent storage impairment. This would reflect classic memory patterns seen in Alzheimer's disease. Further impairment surrounding semantic fluency and confrontation naming would also align with Alzheimer's disease far better than a primary vascular cause. Underlying Alzheimer's disease exacerbated by significant cerebrovascular ailments remains a plausible explanation for his current clinical presentation. Continued medical monitoring will be important moving forward.   Of note, during testing, Derek Blackburn did state "I have this sleep problem... I appear awake but I'm just not with it. I feel that way now." Both he and his wife further described some more acute changes surrounding sleep during interview and attributed these to potential medication side effects. I am unaware if medications were recently changed and he is not on any medications with prominent, well known cognitive side effects based upon what is reflected in his current medication list. Still, members of his medical team may wish to investigate this further.   Recommendations: Derek Blackburn has already been prescribed a medication aimed to address memory loss and concerns surrounding Alzheimer's disease (i.e., donepezil/Aricept). He is encouraged to continue taking this medication as prescribed. It is important to highlight that this medication has been shown to slow functional decline in some individuals. There is no current  treatment which can stop or reverse cognitive decline when caused by a neurodegenerative illness. Unfortunately, there is no clinical benefit from him taking Prevagen or any over-the-counter memory supplements.  Performance across neurocognitive testing is not a strong predictor of an individual's safety operating a motor vehicle.  With that being said, I do have concerns surrounding driving safety based on the severity of ongoing cognitive impairment. I would recommend that he complete a formal driving evaluation to better understand driving abilities. Should he or his family wish to pursue a formalized driving evaluation, they could reach out to the following agencies: [employer] in [place]: [phone] [facility]: [phone] [facility]: [phone] [facility]: [phone] or [phone]  Should there be progression of current deficits over time, Derek Blackburn is unlikely to regain any independent living skills lost. Therefore, it is recommended that he remain as involved as possible in all aspects of household chores, finances, and medication management, with supervision to ensure adequate performance. He will likely benefit from the establishment and maintenance of a routine in order to maximize his functional abilities over time.  It will be important for [name] to have another person with him when in situations where he may need to process information, weigh the pros and cons of different options, and make decisions, in order to ensure that he fully understands and recalls all information to be considered.  If not already done, [name] and his family may want to discuss his wishes regarding durable power of attorney and medical decision making, so that he can have input into these choices. If they require legal assistance with this, long-term care resource access, or other aspects of estate planning, they could reach out to [facility]  [employer] at [phone] for a free consultation. Additionally, they may wish to discuss future plans for caretaking and seek out community options for in home/residential care should they become necessary.  Mr. Amesquita is encouraged to attend to lifestyle factors for brain health (e.g., regular physical exercise, good nutrition habits and consideration of the MIND-DASH diet, regular participation in cognitively-stimulating activities, and general stress management techniques), which are likely to have benefits for both emotional adjustment and cognition. Optimal control of vascular risk factors (including safe cardiovascular exercise and adherence to dietary recommendations) is encouraged. Continued participation in activities which provide mental stimulation and social interaction is also recommended.   Important information should be provided to Mr. Pfohl in written format in all instances. This information should be placed in a highly frequented and easily visible location within his home to promote recall. External strategies such as written notes in a consistently used memory journal, visual and nonverbal auditory cues such as a calendar on the refrigerator or appointments with alarm, such as on a cell phone, can also help maximize recall.  To address problems with processing speed, he may wish to consider:   -Ensuring that he is alerted when essential material or instructions are being presented   -Adjusting the speed at which new information is presented   -Allowing for more time in comprehending, processing, and responding in conversation   -Repeating and paraphrasing instructions or conversations aloud  To address problems with fluctuating attention and/or executive dysfunction, he may wish to consider:   -Avoiding external distractions when needing to concentrate   -Limiting exposure to fast paced environments with multiple sensory demands   -Writing down complicated information and  using checklists   -Attempting and completing one task at a time (i.e., no multi-tasking)   -Verbalizing aloud each step of a task to maintain focus   -Taking frequent breaks during the completion of steps/tasks to avoid fatigue   -Reducing the amount of information considered at one time   -Scheduling more difficult activities for a time of  day where he is usually most alert  Review of Records:   Derek Blackburn was seen by [facility] Derek Blackburn, [place]) on 05/20/2022 for an evaluation of memory loss. At that time, Derek Blackburn reported memory decline for the prior 6-12 months. He was vague and generally unable to describe examples of memory loss ("I cannot pinpoint it, [it's] kind of off." There was some report of increased repetition in day-to-day conversation. He was independent with medication management at that time. He and his wife co-managed finances and he denied driving concerns. Performance on a brief cognitive screening instrument (MOCA) was 18/30. Ultimately, Derek Blackburn was referred for a comprehensive neuropsychological evaluation to characterize his cognitive abilities and to assist with diagnostic clarity and treatment planning.   Neuroimaging: Brain MRI on 06/07/2022 revealed moderate generalized cerebral atrophy, mild cerebellar atrophy, advanced microvascular ischemic disease, multiple lacunar infarcts within the bilateral deep gray nuclei/internal capsules, multiple chronic infarcts within the right and left cerebellar hemispheres, and chronic parenchymal microhemorrhages with a thalamic and cerebellar predominance.   Past Medical History:  Diagnosis Date   Acute pulmonary embolism 09/05/2022   AKI (acute kidney injury) 09/04/2022   Anemia in chronic kidney disease 11/07/2021   Atherosclerosis of native arteries of extremities with intermittent claudication, bilateral legs 08/06/2022   Back pain    Cerebrovascular disease    advanced per MRI   Chest pain 08/06/2022    Chronic kidney disease, stage 4 (severe) 07/11/2020   DVT (deep venous thrombosis) 09/06/2022   Dyspnea on exertion 08/15/2022   Elevated brain natriuretic peptide (BNP) level 08/06/2022   Essential hypertension 07/11/2020   Generalized anxiety disorder 05/08/2020   Hallux valgus (acquired), left foot    Hallux valgus (acquired), right foot    Hardening of the aorta (main artery of the heart) 08/27/2022   Heart murmur    Hyperlipidemia 11/07/2021   Hypertensive urgency    Inflammatory and toxic neuropathy    Malignant tumor of prostate 02/08/2020   Multiple lacunar infarcts    multiple lacunar infarcts within the bilateral deep gray nuclei/internal capsules, multiple chronic infarcts within the right and left cerebellar hemispheres, and chronic parenchymal microhemorrhages with a thalamic and cerebellar predominance   Neck pain 06/19/2022   Overactive bladder 09/01/2020   Peripheral polyneuropathy    Peripheral vascular disease 08/06/2022   Pulmonary hypertension 08/27/2022   Rheumatoid arthritis involving multiple sites with positive rheumatoid factor 08/13/2022   Type 2 diabetes mellitus without complication 08/27/2022    Past Surgical History:  Procedure Laterality Date   BACK SURGERY     PROSTATE BIOPSY     RIGHT/LEFT HEART CATH AND CORONARY ANGIOGRAPHY N/A 08/07/2022   Procedure: RIGHT/LEFT HEART CATH AND CORONARY ANGIOGRAPHY;  Surgeon: [place]az[place], Peter M, MD;  Location: [facility] IN[facility];  Service: Cardiovascular;  Laterality: N/A;    Current Outpatient Medications:    acetaminophen (TYLENOL) 650 MG CR tablet, Take 650 mg by mouth at bedtime., Disp: , Rfl:    amLODipine (NORVASC) 10 MG tablet, Take 1 tablet (10 mg total) by mouth daily., Disp: 90 tablet, Rfl: 3   apixaban (ELIQUIS) 5 MG TABS tablet, Take 1 tablet (5 mg total) by mouth 2 (two) times daily., Disp: 60 tablet, Rfl: 2   Apoaequorin (PREVAGEN) 10 MG CAPS, Take 10 mg by mouth daily., Disp: , Rfl:    atorvastatin  (LIPITOR) 10 MG tablet, Take 1 tablet (10 mg total) by mouth daily., Disp: 90 tablet, Rfl: 3   Calcium Carbonate (CALCIUM 500  PO), Take 500 mg by mouth daily., Disp: , Rfl:    carvedilol (COREG) 6.25 MG tablet, Take 1 tablet (6.25 mg total) by mouth 2 (two) times daily., Disp: 180 tablet, Rfl: 3   darolutamide (NUBEQA) 300 MG tablet, Take 600 mg by mouth 2 (two) times daily with a meal. This medication treats Prostate Cancer., Disp: , Rfl:    diclofenac Sodium (VOLTAREN) 1 % GEL, Apply 2 g topically 4 (four) times daily., Disp: 150 g, Rfl: 1   donepezil (ARICEPT) 10 MG tablet, Take 1 tablet (10 mg total) by mouth daily., Disp: 30 tablet, Rfl: 11   hydrALAZINE (APRESOLINE) 25 MG tablet, Take 3 tablets (75 mg total) by mouth 3 (three) times daily., Disp: 90 tablet, Rfl: 1   JARDIANCE 10 MG TABS tablet, Take 10 mg by mouth daily. (Patient not taking: Reported on 12/20/2022), Disp: , Rfl:    latanoprost (XALATAN) 0.005 % ophthalmic solution, Place 1 drop into both eyes at bedtime., Disp: , Rfl:    leflunomide (ARAVA) 20 MG tablet, Take 20 mg by mouth daily., Disp: , Rfl:    loratadine (CLARITIN) 10 MG tablet, Take 10 mg by mouth daily., Disp: , Rfl:    losartan (COZAAR) 25 MG tablet, Take 1 tablet (25 mg total) by mouth daily., Disp: 90 tablet, Rfl: 3   Multiple Vitamin (MULITIVITAMIN WITH MINERALS) TABS, Take 1 tablet by mouth daily., Disp: , Rfl:    Omega-3 Fatty Acids (FISH OIL) 1200 MG CAPS, Take 1,200 mg by mouth daily., Disp: , Rfl:    ondansetron (ZOFRAN-ODT) 8 MG disintegrating tablet, Take 1 tablet (8 mg total) by mouth every 8 (eight) hours as needed for nausea or vomiting., Disp: 12 tablet, Rfl: 0   timolol (TIMOPTIC) 0.5 % ophthalmic solution, Place 1 drop into both eyes 2 (two) times daily., Disp: , Rfl:    Tiotropium Bromide-Olodaterol (STIOLTO RESPIMAT) 2.5-2.5 MCG/ACT AERS, Inhale 2 puffs into the lungs daily., Disp: 1 each, Rfl: 11   vitamin B-12 (CYANOCOBALAMIN) 100 MCG tablet, Take  100 mcg by mouth daily., Disp: , Rfl:    VITAMIN D, CHOLECALCIFEROL, PO, Take 1 tablet by mouth daily., Disp: , Rfl:   Clinical Interview:   The following information was obtained during a clinical interview with Derek Blackburn and his wife prior to cognitive testing.  Cognitive Symptoms: Decreased short-term memory: Denied. His wife reported "not a whole lot" of memory dysfunction but did allude to orientation difficulties at times and increased repetition in day-to-day conversations.  Decreased long-term memory: Denied. Decreased attention/concentration: Endorsed. He reported difficulties with sustained focus and increased distractibility. His wife was in agreement. Reduced processing speed: Endorsed. Difficulties with executive functions: Endorsed. He reported difficulties with organization and multi-tasking. His wife noted some personality changes in that Mr. Encinias has become "more vocal" lately.  Difficulties with emotion regulation: Denied. Difficulties with receptive language: Denied. Difficulties with word finding: Denied. Decreased visuoperceptual ability: Denied.  Difficulties completing ADLs: Somewhat. His wife manages finances and bill paying which is somewhat longstanding in nature. She has also taken over medication management. This is new relative to his previous April 2024 appointment with Ms. Blackburn. His wife felt that Derek Blackburn was having trouble managing his medications and was forgetting to take them as directed. He reported feeling that he was doing an adequate job. He continues to drive without reported difficulty. His wife was in agreement.   Additional Medical History: History of traumatic brain injury/concussion: Denied. History of stroke: Endorsed (see MRI results  above). History of seizure activity: Denied. History of known exposure to toxins: Denied. Symptoms of chronic pain: Endorsed. He reported ongoing lower back pain.  Experience of frequent  headaches/migraines: Denied. His wife reported a remote history of migraine headaches.  Frequent instances of dizziness/vertigo: Denied.  Sensory changes: He wears glasses with benefit. Other sensory changes/difficulties were not reported.  Balance/coordination difficulties: He reported some instability at times and ambulates with a cane for added stability. One side of the body was not said to be weaker relative to the other. He attributed balance concerns to ongoing back pain. No recent falls were reported.  Other motor difficulties: Denied.  Sleep History: Estimated hours obtained each night: Unclear.  Difficulties falling asleep: Endorsed. This was said to represent a more acute concern and both he and his wife wondered if medication side effects were impacting his ability to fall asleep quickly.  Difficulties staying asleep: Endorsed. He reported waking semi-frequently throughout the night.  Feels rested and refreshed upon awakening: Variably so. He and his wife did note that he fatigues quickly and by the time he is finished with breakfast, may require a nap due to said fatigue.   History of snoring: Denied. History of waking up gasping for air: Denied. Witnessed breath cessation while asleep: Denied.  History of vivid dreaming: Endorsed. This was said to be an acute change, perhaps related to medication side effects.  Excessive movement while asleep: Denied. Instances of acting out his dreams: Denied.  Psychiatric/Behavioral Health History: Depression: When asked about his current mood, he stated that he generally "stay[s] in pretty good spirits." He did acknowledge feeling a little "down" lately due to acute sleep dysfunction and being "puzzled" by this. He denied to his knowledge any formal mental health concerns or diagnoses. Current or remote suicidal ideation, intent, or plan was denied.  Anxiety: Denied. Mania: Denied. Trauma History: Denied. Visual/auditory hallucinations:  Denied. Delusional thoughts: Denied.  Tobacco: Denied. Alcohol: He denied current alcohol consumption as well as a history of problematic alcohol abuse or dependence.  Recreational drugs: Denied.  Family History: Problem Relation Age of Onset   Breast cancer Neg Hx    Colon cancer Neg Hx    Prostate cancer Neg Hx    Pancreatic cancer Neg Hx    This information was confirmed by Derek Blackburn.  Academic/Vocational History: Highest level of educational attainment: 15 years. He graduated from high school and estimated completing three years of college. He did not earn a formal degree. He described himself as an average (B/C) student in academic settings. Math perhaps represented a relative weakness across subjects.  History of developmental delay: Denied. History of grade repetition: Denied. Enrollment in special education courses: Denied. History of LD/ADHD: Denied.  Employment: Retired. He previously worked for the [employer].   Evaluation Results:   Behavioral Observations: Derek Blackburn was accompanied by his wife, arrived to his appointment approximately 25 minutes late, and was appropriately dressed and groomed. He appeared alert. He ambulated with the assistance of a cane. No frank instability was observed. Gross motor functioning appeared intact upon informal observation and no abnormal movements (e.g., tremors) were noted. His affect was generally relaxed and positive. Spontaneous speech was fluent. Word finding difficulties were observed during interview. Thought processes were coherent, organized, and normal in content. Insight into his cognitive difficulties appeared poor in that he generally denied significant dysfunction while objective testing revealed fairly significant impairments.   During testing, he was noted to be very repetitive and tangential  while speaking. He appeared to notably struggle with visual acuity and this likely impacted performances across related  tasks. Task engagement was adequate and he persisted when challenged. He fatigued as the evaluation progressed. He further commented "I have this sleep problem... I appear awake but I'm just not with it. I feel that way now." The current evaluation was abbreviated in response. Overall, Derek Blackburn was cooperative with the clinical interview and subsequent testing procedures.   Adequacy of Effort: The validity of neuropsychological testing is limited by the extent to which the individual being tested may be assumed to have exerted adequate effort during testing. Mr. Ede expressed his intention to perform to the best of his abilities and exhibited adequate task engagement and persistence. Scores across stand-alone and embedded performance validity measures were within expectation. As such, the results of the current evaluation are believed to be a valid representation of Derek Blackburn's current cognitive functioning.  Test Results: Mr. Grief was mildly disoriented at the time of the current evaluation. He incorrectly stated the current year ("2023"), date, and name of the current clinic.  Intellectual abilities based upon educational and vocational attainment were estimated to be in the average range. Premorbid abilities were estimated to be within the below average range based upon a single-word reading test.   Processing speed was exceptionally low to well below average. Basic attention was above average. More complex attention (e.g., working memory) was unable to be assessed due to increasing fatigue. Cognitive flexibility was exceptionally low. Other aspects of executive functioning were unable to be assessed.  Receptive language abilities were unable to be directly assessed. Mr. Vedder did not exhibit any difficulties comprehending task instructions and answered all questions asked of him appropriately. Assessed expressive language was somewhat variable. Phonemic fluency was average, semantic  fluency was exceptionally low to well below average, and confrontation naming was exceptionally low to well below average.    Assessed visuospatial/visuoconstructional abilities were exceptionally low. Across his drawing of a clock, the number 12 was included twice and he exhibited some spacing abnormalities surrounding numerical placement. Clock hands were also placed in incorrect locations. Points were lost across his copy of a complex figure due to several notable visual distortions, as well as the complete omission of several internal and external aspects.     Learning (i.e., encoding) of novel verbal information was exceptionally low to well below average. Spontaneous delayed recall (i.e., retrieval) of previously learned information was exceptionally low. Retention rates were 0% across a story learning task, 0% across a list learning task, and 17% across a figure drawing task. Performance across recognition tasks was exceptionally low to below average, suggesting very limited evidence for information consolidation.   Results of emotional screening instruments suggested that recent symptoms of generalized anxiety were in the minimal range, while symptoms of depression were within normal limits. A screening instrument assessing recent sleep quality suggested the presence of moderate sleep dysfunction.  Tables of Scores:   Note: This summary of test scores accompanies the interpretive report and should not be considered in isolation without reference to the appropriate sections in the text. Descriptors are based on appropriate normative data and may be adjusted based on clinical judgment. Terms such as "Within Normal Limits" and "Outside Normal Limits" are used when a more specific description of the test score cannot be determined.       Percentile - Normative Descriptor > 98 - Exceptionally High 91-97 - Well Above Average 75-90 - Above Average 25-74 - Average  9-24 - Below Average 2-8 - Well  Below Average < 2 - Exceptionally Low       Validity:   DESCRIPTOR       DCT: --- --- Outside Normal Limits  RBANS EI: --- --- Within Normal Limits       Orientation:      Raw Score Percentile   NAB Orientation, Form 1 24/29 --- ---       Cognitive Screening:      Raw Score Percentile   SLUMS: 9/30 --- ---       RBANS, Form A: Standard Score/ Scaled Score Percentile   Total Score 55 <1 Exceptionally Low  Immediate Memory 65 1 Exceptionally Low    List Learning 5 5 Well Below Average    Story Memory 3 1 Exceptionally Low  Visuospatial/Constructional 62 1 Exceptionally Low    Figure Copy 3 1 Exceptionally Low    Line Orientation 8/20 <2 Exceptionally Low  Language 57 <1 Exceptionally Low    Picture Naming 7/10 <2 Exceptionally Low    Semantic Fluency 2 <1 Exceptionally Low  Attention 82 12 Below Average    Digit Span 13 84 Above Average    Coding 1 <1 Exceptionally Low  Delayed Memory 60 <1 Exceptionally Low    List Recall 0/10 <2 Exceptionally Low    List Recognition 17/20 10-16 Below Average    Story Recall 1 <1 Exceptionally Low    Story Recognition 4/12 <1 Exceptionally Low    Figure Recall 3 1 Exceptionally Low    Figure Recognition 2/8 1-5 Exceptionally Low to  Well Below Average        Intellectual Functioning:      Standard Score Percentile   Test of Premorbid Functioning: 85 16 Below Average       Attention/Executive Function:     Trail Making Test (TMT): Raw Score (T Score) Percentile     Part A 269 secs.,  1 error (30) 2 Well Below Average    Part B Discontinued --- Impaired        Language:     Verbal Fluency Test: Raw Score (Scaled Score) Percentile     Phonemic Fluency (CFL) 25 (8) 25 Average    Category Fluency 19 (4) 2 Well Below Average  *Based on [employer] Older Normative Studies (MOANS)          NAB Language Module, Form 1: T Score Percentile     Naming 22/31 (29) 2 Well Below Average       Visuospatial/Visuoconstruction:      Raw Score  Percentile   Clock Drawing: 5/10 --- Impaired       Mood and Personality:      Raw Score Percentile   PROMIS Depression Questionnaire: 10 --- None to Slight  PROMIS Anxiety Questionnaire: 9 --- None to Slight       Additional Questionnaires:      Raw Score Percentile   PROMIS Sleep Disturbance Questionnaire: 34 --- Moderate   Informed Consent and Coding/Compliance:   The current evaluation represents a clinical evaluation for the purposes previously outlined by the referral source and is in no way reflective of a forensic evaluation.   Mr. Deman was provided with a verbal description of the nature and purpose of the present neuropsychological evaluation. Also reviewed were the foreseeable risks and/or discomforts and benefits of the procedure, limits of confidentiality, and mandatory reporting requirements of this provider. The patient was given the opportunity to ask questions and receive answers about  the evaluation. Oral consent to participate was provided by the patient.   This evaluation was conducted by Newman Nickels, Ph.D., ABPP-CN, board certified clinical neuropsychologist. Mr. Donnel completed a clinical interview with Dr. Milbert Coulter, billed as one unit [phone], and 125 minutes of cognitive testing and scoring, billed as one unit [phone] and three additional units [id number]. Psychometrist Wallace Keller, B.S. assisted Dr. Milbert Coulter with test administration and scoring procedures. As a separate and discrete service, one unit [id number] and two units [phone] were billed for Dr. Tammy Sours time spent in interpretation and report writing.

## 2023-01-17 ENCOUNTER — Ambulatory Visit (HOSPITAL_COMMUNITY)
Admission: RE | Admit: 2023-01-17 | Discharge: 2023-01-17 | Disposition: A | Discharge status: Home / Self Care | Payer: Medicare HMO | Source: Ambulatory Visit | Attending: Nephrology | Admitting: Nephrology

## 2023-01-17 VITALS — BP 119/62 | HR 72 | Temp 97.7°F | Resp 17

## 2023-01-17 DIAGNOSIS — N189 Chronic kidney disease, unspecified: Secondary | ICD-10-CM | POA: Insufficient documentation

## 2023-01-17 DIAGNOSIS — Z862 Personal history of diseases of the blood and blood-forming organs and certain disorders involving the immune mechanism: Secondary | ICD-10-CM | POA: Insufficient documentation

## 2023-01-17 LAB — POCT HEMOGLOBIN-HEMACUE: Hemoglobin: 9.6 g/dL — ABNORMAL LOW (ref 13.0–17.0)

## 2023-01-17 MED ORDER — DARBEPOETIN ALFA 60 MCG/0.3ML IJ SOSY
PREFILLED_SYRINGE | INTRAMUSCULAR | Status: AC
  Filled 2023-01-17: qty 0.3

## 2023-01-17 MED ORDER — DARBEPOETIN ALFA 60 MCG/0.3ML IJ SOSY
60.0000 ug | PREFILLED_SYRINGE | INTRAMUSCULAR | Status: DC
  Administered 2023-01-17: 60 ug via SUBCUTANEOUS

## 2023-01-21 ENCOUNTER — Ambulatory Visit: Payer: Medicare Other | Admitting: Psychology

## 2023-01-21 DIAGNOSIS — I6381 Other cerebral infarction due to occlusion or stenosis of small artery: Secondary | ICD-10-CM | POA: Diagnosis not present

## 2023-01-21 DIAGNOSIS — F0150 Vascular dementia without behavioral disturbance: Secondary | ICD-10-CM

## 2023-01-21 DIAGNOSIS — I679 Cerebrovascular disease, unspecified: Secondary | ICD-10-CM | POA: Diagnosis not present

## 2023-01-21 DIAGNOSIS — G309 Alzheimer's disease, unspecified: Secondary | ICD-10-CM

## 2023-01-21 DIAGNOSIS — F0280 Dementia in other diseases classified elsewhere without behavioral disturbance: Secondary | ICD-10-CM

## 2023-01-21 NOTE — Progress Notes (Signed)
   Neuropsychology Feedback Session [name]. [facility] Department of Neurology  Reason for Referral:   Derek Blackburn is a 87 y.o. right-handed African-American male referred by Marlowe Kays, PA-C, to characterize his current cognitive functioning and assist with diagnostic clarity and treatment planning in the context of subjective cognitive decline.   Feedback:   [name] completed a comprehensive neuropsychological evaluation on 01/14/2023. Please refer to that encounter for the full report and recommendations. Briefly, results suggested fairly diffuse and often quite severe cognitive impairment. Normative impairments were exhibited across processing speed, cognitive flexibility, semantic fluency, confrontation naming, visuospatial abilities, and all aspects of learning and memory. The cause for his dementia presentation is likely multifactorial in nature. His most recent brain MRI revealed advanced microvascular ischemic disease, multiple lacunar infarcts within the bilateral deep gray nuclei/internal capsules, multiple chronic infarcts within the right and left cerebellar hemispheres, and chronic parenchymal microhemorrhages with a thalamic and cerebellar predominance. The presence of cerebrovascular disease and numerous strokes across different brain regions can certainly explain some degree of current impairment and I do believe that these findings are playing an active role in his current clinical presentation. I do also have concern for an underlying neurodegenerative illness in addition to significant vascular contributions (i.e., a mixed dementia presentation). Across memory testing, [name] was fully amnestic (i.e., 0% retention) across both verbal tasks and only exhibited 17% retention across a visual task. He also performed very poorly across yes/no recognition trials. Taken together, this suggests evidence for rapid forgetting and an evolving and already  quite prominent storage impairment. This would reflect classic memory patterns seen in Alzheimer's disease. Further impairment surrounding semantic fluency and confrontation naming would also align with Alzheimer's disease far better than a primary vascular cause. Underlying Alzheimer's disease exacerbated by significant cerebrovascular ailments remains a plausible explanation for his current clinical presentation.   [name] was accompanied by his wife during the current feedback session. Content of the current session focused on the results of his neuropsychological evaluation. [name] was given the opportunity to ask questions and his questions were answered. He was encouraged to reach out should additional questions arise. A copy of his report was provided at the conclusion of the visit.      One unit [phone] was billed for Dr. Tammy Sours time spent preparing for, conducting, and documenting the current feedback session with [name].

## 2023-01-24 NOTE — Telephone Encounter (Signed)
Atc pt no answer, unable to leave vm

## 2023-01-31 ENCOUNTER — Encounter (HOSPITAL_COMMUNITY)
Admission: RE | Admit: 2023-01-31 | Discharge: 2023-01-31 | Disposition: A | Discharge status: Home / Self Care | Payer: Medicare Other | Source: Ambulatory Visit | Attending: Nephrology | Admitting: Nephrology

## 2023-01-31 ENCOUNTER — Encounter (HOSPITAL_COMMUNITY): Payer: Self-pay

## 2023-01-31 VITALS — BP 147/82 | HR 78 | Temp 97.5°F | Resp 17

## 2023-01-31 DIAGNOSIS — N189 Chronic kidney disease, unspecified: Secondary | ICD-10-CM | POA: Insufficient documentation

## 2023-01-31 DIAGNOSIS — Z862 Personal history of diseases of the blood and blood-forming organs and certain disorders involving the immune mechanism: Secondary | ICD-10-CM | POA: Diagnosis present

## 2023-01-31 LAB — IRON AND TIBC
Iron: 83 ug/dL (ref 45–182)
Saturation Ratios: 29 % (ref 17.9–39.5)
TIBC: 290 ug/dL (ref 250–450)
UIBC: 207 ug/dL

## 2023-01-31 LAB — FERRITIN: Ferritin: 284 ng/mL (ref 24–336)

## 2023-01-31 MED ORDER — DARBEPOETIN ALFA 60 MCG/0.3ML IJ SOSY: 60.0000 ug | PREFILLED_SYRINGE | INTRAMUSCULAR | Status: DC

## 2023-01-31 MED ORDER — DARBEPOETIN ALFA 60 MCG/0.3ML IJ SOSY
PREFILLED_SYRINGE | INTRAMUSCULAR | Status: AC
  Administered 2023-01-31: 60 ug via SUBCUTANEOUS
  Filled 2023-01-31: qty 0.3

## 2023-02-03 LAB — POCT HEMOGLOBIN-HEMACUE: Hemoglobin: 9.1 g/dL — ABNORMAL LOW (ref 13.0–17.0)

## 2023-02-07 NOTE — Telephone Encounter (Signed)
 Called and spoke with pt, went over hst results. Pt would like to discuss at his next ov

## 2023-02-14 ENCOUNTER — Ambulatory Visit (HOSPITAL_COMMUNITY)
Admission: RE | Admit: 2023-02-14 | Discharge: 2023-02-14 | Disposition: A | Discharge status: Home / Self Care | Payer: Medicare Other | Source: Ambulatory Visit | Attending: Nephrology | Admitting: Nephrology

## 2023-02-14 VITALS — BP 141/88 | HR 68 | Temp 97.3°F | Resp 17

## 2023-02-14 DIAGNOSIS — N189 Chronic kidney disease, unspecified: Secondary | ICD-10-CM | POA: Diagnosis present

## 2023-02-14 DIAGNOSIS — Z862 Personal history of diseases of the blood and blood-forming organs and certain disorders involving the immune mechanism: Secondary | ICD-10-CM | POA: Diagnosis present

## 2023-02-14 LAB — POCT HEMOGLOBIN-HEMACUE: Hemoglobin: 9.3 g/dL — ABNORMAL LOW (ref 13.0–17.0)

## 2023-02-14 MED ORDER — DARBEPOETIN ALFA 60 MCG/0.3ML IJ SOSY
60.0000 ug | PREFILLED_SYRINGE | INTRAMUSCULAR | Status: DC
  Administered 2023-02-14: 60 ug via SUBCUTANEOUS

## 2023-02-14 MED ORDER — DARBEPOETIN ALFA 60 MCG/0.3ML IJ SOSY
PREFILLED_SYRINGE | INTRAMUSCULAR | Status: AC
  Filled 2023-02-14: qty 0.3

## 2023-02-27 ENCOUNTER — Ambulatory Visit (HOSPITAL_COMMUNITY): Payer: Medicare Other | Attending: Pulmonary Disease

## 2023-02-27 DIAGNOSIS — I2609 Other pulmonary embolism with acute cor pulmonale: Secondary | ICD-10-CM | POA: Diagnosis present

## 2023-02-27 LAB — ECHOCARDIOGRAM COMPLETE
AR max vel: 1.51 cm2
AV Area VTI: 1.84 cm2
AV Area mean vel: 1.55 cm2
AV Mean grad: 21 mm[Hg]
AV Peak grad: 38.2 mm[Hg]
Ao pk vel: 3.09 m/s
Area-P 1/2: 4.15 cm2
P 1/2 time: 603 ms
S' Lateral: 2.4 cm

## 2023-02-28 ENCOUNTER — Encounter (HOSPITAL_COMMUNITY)
Admission: RE | Admit: 2023-02-28 | Discharge: 2023-02-28 | Disposition: A | Discharge status: Home / Self Care | Payer: Medicare Other | Source: Ambulatory Visit | Attending: Nephrology | Admitting: Nephrology

## 2023-02-28 VITALS — BP 136/82 | HR 70 | Temp 97.2°F | Resp 17

## 2023-02-28 DIAGNOSIS — N189 Chronic kidney disease, unspecified: Secondary | ICD-10-CM

## 2023-02-28 LAB — POCT HEMOGLOBIN-HEMACUE: Hemoglobin: 9.2 g/dL — ABNORMAL LOW (ref 13.0–17.0)

## 2023-02-28 MED ORDER — DARBEPOETIN ALFA 60 MCG/0.3ML IJ SOSY
PREFILLED_SYRINGE | INTRAMUSCULAR | Status: AC
  Filled 2023-02-28: qty 0.3

## 2023-02-28 MED ORDER — DARBEPOETIN ALFA 60 MCG/0.3ML IJ SOSY
60.0000 ug | PREFILLED_SYRINGE | INTRAMUSCULAR | Status: DC
Start: 2023-02-28 — End: 2024-03-12
  Administered 2023-02-28: 60 ug via SUBCUTANEOUS

## 2023-03-03 LAB — PULMONARY FUNCTION TEST
DL/VA % pred: 86 %
DL/VA: 3.29 ml/min/mmHg/L
DLCO cor % pred: 51 %
DLCO cor: 12.3 ml/min/mmHg
DLCO unc % pred: 43 %
DLCO unc: 10.24 ml/min/mmHg
FEF 25-75 Post: 0.43 L/s
FEF 25-75 Pre: 0.7 L/s
FEF2575-%Change-Post: -39 %
FEF2575-%Pred-Post: 24 %
FEF2575-%Pred-Pre: 40 %
FEV1-%Change-Post: -10 %
FEV1-%Pred-Post: 44 %
FEV1-%Pred-Pre: 49 %
FEV1-Post: 1.18 L
FEV1-Pre: 1.32 L
FEV1FVC-%Change-Post: 5 %
FEV1FVC-%Pred-Pre: 95 %
FEV6-%Change-Post: -16 %
FEV6-%Pred-Post: 46 %
FEV6-%Pred-Pre: 55 %
FEV6-Post: 1.63 L
FEV6-Pre: 1.95 L
FEV6FVC-%Pred-Post: 107 %
FEV6FVC-%Pred-Pre: 107 %
FVC-%Change-Post: -15 %
FVC-%Pred-Post: 43 %
FVC-%Pred-Pre: 51 %
FVC-Post: 1.65 L
FVC-Pre: 1.95 L
Post FEV1/FVC ratio: 71 %
Post FEV6/FVC ratio: 100 %
Pre FEV1/FVC ratio: 68 %
Pre FEV6/FVC Ratio: 100 %

## 2023-03-04 ENCOUNTER — Ambulatory Visit: Payer: Medicare Other | Attending: Physician Assistant | Admitting: Physician Assistant

## 2023-03-04 VITALS — BP 100/60 | HR 71 | Ht 71.0 in | Wt 149.2 lb

## 2023-03-04 DIAGNOSIS — E049 Nontoxic goiter, unspecified: Secondary | ICD-10-CM

## 2023-03-04 DIAGNOSIS — R5383 Other fatigue: Secondary | ICD-10-CM

## 2023-03-04 DIAGNOSIS — I10 Essential (primary) hypertension: Secondary | ICD-10-CM

## 2023-03-04 DIAGNOSIS — R0609 Other forms of dyspnea: Secondary | ICD-10-CM

## 2023-03-04 DIAGNOSIS — I2609 Other pulmonary embolism with acute cor pulmonale: Secondary | ICD-10-CM

## 2023-03-04 DIAGNOSIS — G8929 Other chronic pain: Secondary | ICD-10-CM

## 2023-03-04 DIAGNOSIS — N1832 Chronic kidney disease, stage 3b: Secondary | ICD-10-CM

## 2023-03-04 DIAGNOSIS — E785 Hyperlipidemia, unspecified: Secondary | ICD-10-CM

## 2023-03-04 DIAGNOSIS — M549 Dorsalgia, unspecified: Secondary | ICD-10-CM

## 2023-03-04 NOTE — Progress Notes (Signed)
 Cardiology Office Note:  .   Date:  03/04/2023  ID:  Derek Blackburn, DOB [birth year], MRN [id number] PCP: Lenon Nell SAILOR, FNP  [facility] Providers Cardiologist:  [name], MD {  History of Present Illness: .   Derek Blackburn is a 88 y.o. male with past medical history of anemia, CAD, HTN, prostate cancer and PAD who presents for follow-up appointment.  He was 11/2021 as a new consult.  He was evaluated for cardiac murmur.  No complaints.  Denied chest pain, dyspnea, lower extremity edema, and dizziness.  Echocardiogram was ordered with LVEF 60 to 65%, grade 1 DD, left and right atrial size was moderately dilated, mild to moderate TR, moderate aortic stenosis, borderline dilatation of ascending aorta measuring 38 mm.  He was seen by Dr. Cash 05/2022 and at that time he denies chest pain, dyspnea, palpitations, tremor edema, orthopnea, PND, dizziness, syncope, near syncope.  BP was elevated at 1 40-1 60 at home.  Presented to the ED 7/9 and was found to have a mildly elevated troponin, elevated BNP.  Right left heart catheterization was ordered showing minimal nonobstructive coronary artery disease.  Was chest pain-free and hemodynamically stable.  Cleared for discharge.  Ultimately, seen in the ED again 7/16  for shortness of breath.  Patient had evidence of hypertensive urgency that was likely causing symptoms.  Was restarted on home antihypertensives with carvedilol  being increased.  Significant improvement of blood pressure.  Blood pressure was as high as 213 systolic.  He was seen by me 08/23/22, he tells me that his BP has been higher but well controlled today in the clinic. He has been tracking at home and usually 147/91.  Today in the clinic it is 128/74.  Recently in the ED and it was extremely high.  He is asked to continue his amlodipine  10 mg daily, carvedilol  12.5 mg twice a day, hydralazine  75 mg 3 times a day.  He did not know he was a diabetic  and is taking Jardiance .  The right side of his neck is swollen today.  We will plan to get an ultrasound for further evaluation.  We also discussed getting an ultrasound of his renal arteries to check why his blood pressure has been so difficult to control.  Recent testing including echocardiogram and cardiac catheterization reviewed today.  Moderate aortic valve stenosis.   He saw me back in November 2024, he  presents with a history of a pulmonary embolism and hypertension,  with a goiter on the right side of the neck, fatigue, and chronic back pain. The goiter was identified on a recent ultrasound, measuring 10 by 5.5 centimeters. The patient has not yet followed up with his primary care provider regarding this finding. The patient reports fatigue, which he believes may be related to his medication, specifically Carvedilol . The patient also reports chronic back pain, which he manages with Tylenol  BID. The back pain is a constant presence, and the patient reports feeling a little weak. The patient has previously had back surgery and continues to experience issues. We recommended follow-up with his back doctor. He also has some SOB from time to time.  Reports no shortness of breath nor dyspnea on exertion. Reports no chest pain, pressure, or tightness. No edema, orthopnea, PND. Reports no palpitations.   Discussed the use of AI scribe software for clinical note transcription with the patient, who gave verbal consent to proceed.  Today, he presents with a history of thyroid  goiter, aortic stenosis,  and chronic back pain with ongoing fatigue and sleep disturbances. The patient reports feeling sleepy during the day and not sleeping well at night, waking up two to three times to use the restroom. The patient denies that his chronic back pain is contributing to his sleep disturbances.  The patient's fatigue may be multifactorial, potentially related to his thyroid  goiter, sleep disturbances, and sleep apnea.  The patient's carvedilol  dose was recently reduced to 6.25mg  due to concerns that it may be contributing to his fatigue. However, the patient reports no significant improvement in his fatigue since the medication change.  The patient's thyroid  function was recently found to be abnormal, with a low TSH. The patient is not currently on any thyroid  medication and has not had any changes to his medications since his abnormal thyroid  function was discovered. The patient also has a history of aortic stenosis and a small aortic aneurysm, which are being monitored. The patient's blood pressure has been well-controlled with carvedilol  and hydralazine .  Reports no shortness of breath nor dyspnea on exertion. Reports no chest pain, pressure, or tightness. No edema, orthopnea, PND. Reports no palpitations.   Discussed the use of AI scribe software for clinical note transcription with the patient, who gave verbal consent to proceed.  ROS: Pertinent ROS in HPI  Studies Reviewed: .       Cardiac catheterization 08/07/2022  LV end diastolic pressure is normal.   Hemodynamic findings consistent with mild pulmonary hypertension.   There is mild aortic valve stenosis.   Minimal nonobstructive CAD Mild aortic stenosis. Mean AV gradient 11 mm Hg. AVA 2.05 cm squared with index 1.1 Normal LV filling pressures. LVEDP 7 mm Hg. PCWP 8/7 with mean 6 mm Hg Mild pulmonary HTN. PAP 60/14 with mean 32 mm Hg Normal RA pressure mean 9 mm Hg Normal cardiac output 5.73 L/min, index 3.08.   Plan: medical management.      Physical Exam:   VS:  There were no vitals taken for this visit.   Wt Readings from Last 3 Encounters:  01/01/23 146 lb (66.2 kg)  12/20/22 148 lb (67.1 kg)  12/02/22 143 lb 12.8 oz (65.2 kg)    GEN: Well nourished, well developed in no acute distress NECK: No JVD; No carotid bruits, right neck mass noted CARDIAC: RRR, + systolic murmur, rubs, gallops RESPIRATORY:  Clear to auscultation without  rales, wheezing or rhonchi  ABDOMEN: Soft, non-tender, non-distended EXTREMITIES:  No edema; No deformity   ASSESSMENT AND PLAN: .    Thyroid  Goiter Large goiter (10x5 cm) with low TSH. No intervention or medication initiated by primary care provider. Possible contribution to fatigue and sleep disturbances. -Contact primary care provider to address thyroid  goiter and abnormal TSH. -Consider rechecking TSH.  Sleep Disturbances Reports poor sleep quality and excessive daytime sleepiness. Diagnosed with sleep apnea and recommended for CPAP therapy. -Encourage use of CPAP machine. -Trial of over-the-counter sleep aids (Tylenol  PM, Melatonin 5mg ).  Hypertension Blood pressure well-controlled on current regimen (Carvedilol  6.25mg ). Mild thickening of left ventricle and mild aortic stenosis noted on echocardiogram, likely secondary to long-term hypertension. -Continue current antihypertensive regimen. -Monitor blood pressure at home.  Chronic Back Pain Managed with Tylenol . No reported sleep disturbances due to pain. -Continue current pain management regimen.  Aortic Stenosis and Aneurysm Mild aortic stenosis and small aortic aneurysm noted on echocardiogram. -Monitor for symptoms of lightheadedness, dizziness, or syncope. -Plan for annual echocardiogram to monitor progression.  Peripheral Neuropathy Reports tingling in toes, managed conservatively. -Continue current  management and monitor for progression.  Pulmonary Embolism (history) Treated with Eliquis  5mg  twice daily. No current symptoms reported. -Continue Eliquis  5mg  twice daily.      Dispo: He can return in 5-6 months to see Dr. Verlin or APP  Signed, Derek LOISE Fabry, PA-C

## 2023-03-04 NOTE — Patient Instructions (Signed)
Medication Instructions:  Your physician recommends that you continue on your current medications as directed. Please refer to the Current Medication list given to you today.  *If you need a refill on your cardiac medications before your next appointment, please call your pharmacy*  Lab Work: None ordered today.  Testing/Procedures: None ordered today.  Follow-Up: At [facility], you and your health needs are our priority.  As part of our continuing mission to provide you with exceptional heart care, we have created designated Provider Care Teams.  These Care Teams include your primary Cardiologist (physician) and Advanced Practice Providers (APPs -  Physician Assistants and Nurse Practitioners) who all work together to provide you with the care you need, when you need it.  Your next appointment:   5-6 month(s)  Provider:   Verne Carrow, MD    Other Instructions Please start using your CPAP machine, if you are having any trouble or concerns with this please contact Dr. Laurena Spies office.  Please continue to monitor your blood pressure at home.  HOW TO TAKE YOUR BLOOD PRESSURE Rest 5 minutes before taking your blood pressure. Don't  smoke or drink caffeinated beverages for at least 30 minutes before. Take your blood pressure before (not after) you eat. Sit comfortably with your back supported and both feet on the floor ( don't cross your legs). Elevate your arm to heart level on a table or a desk. Use the proper sized cuff.  It should fit smoothly and snugly around your bare upper arm. There should be enough room to slip a fingertip under the cuff.  The bottom edge of the cuff should be 1 inch above the crease of the elbow.  Please monitor your blood pressure once daily 2 hours after your am medication. If you blood pressure consistently remains above 140 (systolic) top number or over 90 ( diastolic) bottom number X 3 days consecutively.  Please call our office at  [phone] or send Mychart message.          1st Floor: - Lobby - Registration  - Pharmacy  - Lab - Cafe  2nd Floor: - PV Lab - Diagnostic Testing (echo, CT, nuclear med)  3rd Floor: - Vacant  4th Floor: - TCTS (cardiothoracic surgery) - AFib Clinic - Structural Heart Clinic - Vascular Surgery  - Vascular Ultrasound  5th Floor: - [facility] Cardiology (general and EP) - Clinical Pharmacy for coumadin, hypertension, lipid, weight-loss medications, and med management appointments    Valet parking services will be available as well.

## 2023-03-14 ENCOUNTER — Encounter (HOSPITAL_COMMUNITY)
Admission: RE | Admit: 2023-03-14 | Discharge: 2023-03-14 | Disposition: A | Discharge status: Home / Self Care | Payer: Medicare Other | Source: Ambulatory Visit | Attending: Nephrology | Admitting: Nephrology

## 2023-03-14 VITALS — BP 165/103 | HR 70 | Temp 97.6°F | Resp 17

## 2023-03-14 DIAGNOSIS — Z862 Personal history of diseases of the blood and blood-forming organs and certain disorders involving the immune mechanism: Secondary | ICD-10-CM | POA: Diagnosis present

## 2023-03-14 DIAGNOSIS — N189 Chronic kidney disease, unspecified: Secondary | ICD-10-CM | POA: Diagnosis present

## 2023-03-14 LAB — IRON AND TIBC
Iron: 53 ug/dL (ref 45–182)
Saturation Ratios: 20 % (ref 17.9–39.5)
TIBC: 266 ug/dL (ref 250–450)
UIBC: 213 ug/dL

## 2023-03-14 LAB — POCT HEMOGLOBIN-HEMACUE: Hemoglobin: 9.1 g/dL — ABNORMAL LOW (ref 13.0–17.0)

## 2023-03-14 LAB — FERRITIN: Ferritin: 138 ng/mL (ref 24–336)

## 2023-03-14 MED ORDER — DARBEPOETIN ALFA 60 MCG/0.3ML IJ SOSY
60.0000 ug | PREFILLED_SYRINGE | INTRAMUSCULAR | Status: DC
  Administered 2023-03-14: 60 ug via SUBCUTANEOUS

## 2023-03-14 MED ORDER — DARBEPOETIN ALFA 60 MCG/0.3ML IJ SOSY
PREFILLED_SYRINGE | INTRAMUSCULAR | Status: AC
  Filled 2023-03-14: qty 0.3

## 2023-03-21 ENCOUNTER — Emergency Department (HOSPITAL_COMMUNITY)
Admission: EM | Admit: 2023-03-21 | Discharge: 2023-03-21 | Discharge status: Against Medical Advice | Payer: Medicare Other | Attending: Emergency Medicine | Admitting: Emergency Medicine

## 2023-03-21 DIAGNOSIS — M545 Low back pain, unspecified: Secondary | ICD-10-CM | POA: Insufficient documentation

## 2023-03-21 DIAGNOSIS — Z5321 Procedure and treatment not carried out due to patient leaving prior to being seen by health care provider: Secondary | ICD-10-CM | POA: Insufficient documentation

## 2023-03-21 MED ORDER — HYDROCODONE-ACETAMINOPHEN 5-325 MG PO TABS
1.0000 | ORAL_TABLET | Freq: Once | ORAL | Status: AC
  Administered 2023-03-21: 1 via ORAL
  Filled 2023-03-21: qty 1

## 2023-03-21 MED ORDER — LIDOCAINE 5 % EX PTCH
1.0000 | MEDICATED_PATCH | CUTANEOUS | Status: DC
  Administered 2023-03-21: 1 via TRANSDERMAL
  Filled 2023-03-21: qty 1

## 2023-03-21 NOTE — ED Notes (Signed)
 Pt left without being seen.

## 2023-03-21 NOTE — ED Triage Notes (Signed)
 Patient BIB family for back pain and decreased mobility since Tuesday. Patient family states that Tuesday the patient was normal, then it got worse each day until today. Patient reports tylenol helped a little but not consistently.

## 2023-03-21 NOTE — ED Provider Triage Note (Signed)
 Emergency Medicine Provider Triage Evaluation Note  Derek Blackburn , a 88 y.o. male  was evaluated in triage.  Pt complains of low back pain since Wednesday.  Patient has not had any fevers, trauma, saddle anesthesia, urinary or bowel incontinence and states this is his chronic back pain flaring up.  Patient received an epidural steroid injection a year ago which really helped through Ortho care and thinks that he may need another 1 of these.  Patient to be able to walk but there is discomfort.  Patient is taking Tylenol with no relief.  Review of Systems  Positive:  Negative:   Physical Exam  BP (!) 172/93 (BP Location: Right Arm)   Pulse 69   Temp 98.3 F (36.8 C)   Resp 18   SpO2 97%  Gen:   Awake, no distress   Resp:  Normal effort  MSK:   Moves extremities without difficulty  Other:  No midline tenderness or paraspinal muscular tenderness, 5 out of 5 bilateral hip flexion, knee extension/flexion  Medical Decision Making  Medically screening exam initiated at 4:43 PM.  Appropriate orders placed.  Derek Blackburn was informed that the remainder of the evaluation will be completed by another provider, this initial triage assessment does not replace that evaluation, and the importance of remaining in the ED until their evaluation is complete.  Patient given pain meds for his back pain, son was asking if we would be able to help facilitate patient getting an epidural injection in the outpatient setting, patient stable.   Netta Corrigan, PA-C 03/21/23 1649

## 2023-03-24 ENCOUNTER — Ambulatory Visit: Payer: Medicare Other | Admitting: Physical Medicine and Rehabilitation

## 2023-03-24 ENCOUNTER — Encounter: Payer: Self-pay | Admitting: Physical Medicine and Rehabilitation

## 2023-03-24 VITALS — BP 138/89 | HR 78

## 2023-03-24 DIAGNOSIS — G8929 Other chronic pain: Secondary | ICD-10-CM | POA: Diagnosis not present

## 2023-03-24 DIAGNOSIS — M5442 Lumbago with sciatica, left side: Secondary | ICD-10-CM | POA: Diagnosis not present

## 2023-03-24 DIAGNOSIS — M961 Postlaminectomy syndrome, not elsewhere classified: Secondary | ICD-10-CM

## 2023-03-24 DIAGNOSIS — M5416 Radiculopathy, lumbar region: Secondary | ICD-10-CM | POA: Diagnosis not present

## 2023-03-24 DIAGNOSIS — M48062 Spinal stenosis, lumbar region with neurogenic claudication: Secondary | ICD-10-CM | POA: Diagnosis not present

## 2023-03-24 DIAGNOSIS — M5441 Lumbago with sciatica, right side: Secondary | ICD-10-CM

## 2023-03-24 DIAGNOSIS — R4189 Other symptoms and signs involving cognitive functions and awareness: Secondary | ICD-10-CM

## 2023-03-24 NOTE — Progress Notes (Unsigned)
 Pain Score---10

## 2023-03-24 NOTE — Progress Notes (Unsigned)
 [name] - 88 y.o. male MRN [id number]  Date of birth: [birth year]  Office Visit Note: Visit Date: 03/24/2023 PCP: Diamantina Providence, FNP Referred by: Diamantina Providence, FNP  Subjective: Chief Complaint  Patient presents with   Lower Back - Pain   HPI: Derek Blackburn is a 88 y.o. male who comes in today for evaluation of chronic, worsening and severe bilateral lower back pain radiating down legs. Pain ongoing for several years. Patient is somewhat of a poor historian. His wife is accompanying him during our visit today. His pain worsens with walking and activity, describes pain as sore and aching sensation, he reports numbness and tingling to bilateral feet. Significant relief of pain with sitting. He recently attempted to return to [facility], reports difficulty performing exercises due to severe pain. Lumbar MRI from April of 2024 shows severe spinal canal stenosis at L2-L3, L3-L4 and L4-L5. History of prior laminectomy at the level of L4-L5 by Dr. Otelia Sergeant in 2000. He underwent right L3-L4 interlaminar epidural steroid injection in our office on 06/05/2022. Patient continues with Aricept he is being managed by Dr. Marlowe Kays with [facility]. Patient denies focal weakness. He is currently using cane to assist with ambulation. No recent trauma or falls.          Review of Systems  Musculoskeletal:  Positive for back pain.  Neurological:  Positive for tingling. Negative for focal weakness and weakness.  All other systems reviewed and are negative.  Otherwise per HPI.  Assessment & Plan: Visit Diagnoses:    ICD-10-CM   1. Chronic bilateral low back pain with bilateral sciatica  M54.42 Ambulatory referral to Physical Medicine Rehab   M54.41    G89.29     2. Lumbar radiculopathy  M54.16 Ambulatory referral to Physical Medicine Rehab    3. Spinal stenosis of lumbar region with neurogenic claudication  M48.062 Ambulatory referral to Physical Medicine Rehab    4. Post  laminectomy syndrome  M96.1 Ambulatory referral to Physical Medicine Rehab    5. Cognitive decline  R41.89 Ambulatory referral to Physical Medicine Rehab       Plan: Findings:  Chronic, worsening and severe bilateral lower back pain radiating down bilateral legs. Patient continues to have severe pain despite good conservative therapies such as home exercise regimen, rest and use of medications. Patients clinical presentation and exam are consistent with neurogenic claudication as a result of spinal canal stenosis. There is severe spinal canal stenosis at L2-L3, L3-L4 and and L4-L5. Next step is to perform bilateral L3 transforaminal epidural steroid injection. He does not need to discontinue Eliquis for this specific injection. If good relief of pain with injection we can repeat this procedure infrequently as needed. I instructed patient to continue with 650 mg of Tylenol twice daily. Patient has no questions at this time. No red flag symptoms noted upon exam today.     Meds & Orders: No orders of the defined types were placed in this encounter.   Orders Placed This Encounter  Procedures   Ambulatory referral to Physical Medicine Rehab    Follow-up: Return for Bilateral L3 transforaminal epidural steroid injection.   Procedures: No procedures performed      Clinical History: CLINICAL DATA:  Back pain. History of prostate cancer. History of back surgery.   EXAM: MRI LUMBAR SPINE WITHOUT CONTRAST   TECHNIQUE: Multiplanar, multisequence MR imaging of the lumbar spine was performed. No intravenous contrast was administered.   COMPARISON:  None Available.  FINDINGS: Segmentation:  Standard.   Alignment:  There is straightening of the normal lumbar lordosis.   Vertebrae: There is diffusely heterogeneous marrow signal throughout the lumbar spine in the sacrum. This may be related to a history of radiation therapy. Correlate with patient's history. There is osseous fusion L4-L5 and  L5-S1.   Conus medullaris and cauda equina: Conus extends to the L1-L2 disc space level. Conus and cauda equina appear normal.   Paraspinal and other soft tissues: There are multiple T2 hyperintense renal lesions lesions are nonspecific, but favored to represent simple renal cysts. These require no further workup. Fatty atrophy of the paraspinal musculature.   Disc levels:   T12-L1: Severe left and moderate right facet degenerative change. Eccentric right disc bulge. Mild overall spinal canal narrowing. Severe right neural foraminal narrowing.   L1-L2: Severe bilateral facet degenerative change. Eccentric left disc bulge. Moderate spinal canal narrowing. Severe bilateral neural foraminal narrowing.   L2-L3: Severe bilateral facet degenerative change. Short pedicles. Circumferential disc bulge. Severe spinal canal narrowing. Moderate left and mild right neural foraminal narrowing.   L3-L4: Severe bilateral facet degenerative change. Short pedicles. Circumferential disc bulge. Severe spinal canal narrowing. Moderate right and mild left neural foraminal narrowing.   L4-L5: Severe bilateral facet degenerative change. No spinal canal narrowing. No significant disc bulge. Moderate left and mild right neural foraminal narrowing.   L5-S1: Moderate bilateral facet degenerative change. No significant disc bulge. No spinal canal narrowing. Mild bilateral neural foraminal narrowing.   IMPRESSION: 1. Severe spinal canal narrowing at L2-L3, L3-L4, and L4-L5. 2. Severe neural foraminal narrowing at L1-L2 (bilateral) and T12-L1 (right). 3. Diffusely heterogeneous marrow signal throughout the lumbar spine and the sacrum. This may be related to a history of radiation therapy. Correlate with patient's history.     Electronically Signed   By: Lorenza Cambridge M.D.   On: 05/07/2022 14:00   He reports that he has quit smoking. His smoking use included cigarettes. He has never used smokeless  tobacco. No results for input(s): "HGBA1C", "LABURIC" in the last 8760 hours.  Objective:  VS:  HT:    WT:   BMI:     BP:138/89  HR:78bpm  TEMP: ( )  RESP:  Physical Exam Vitals and nursing note reviewed.  HENT:     Head: Normocephalic and atraumatic.     Right Ear: External ear normal.     Left Ear: External ear normal.     Nose: Nose normal.     Mouth/Throat:     Mouth: Mucous membranes are moist.  Eyes:     Extraocular Movements: Extraocular movements intact.  Cardiovascular:     Rate and Rhythm: Normal rate.     Pulses: Normal pulses.  Pulmonary:     Effort: Pulmonary effort is normal.  Abdominal:     General: Abdomen is flat. There is no distension.  Musculoskeletal:        General: Tenderness present.     Cervical back: Tenderness present.     Comments: Patient rises from seated position to standing without difficulty. Good lumbar range of motion. No pain noted with facet loading. 5/5 strength noted with bilateral hip flexion, knee flexion/extension, ankle dorsiflexion/plantarflexion and EHL. No clonus noted bilaterally. No pain upon palpation of greater trochanters. No pain with internal/external rotation of bilateral hips. Sensation intact bilaterally. Negative slump test bilaterally. Ambulates with cane, gait slow and unsteady.   Skin:    General: Skin is warm and dry.     Capillary  Refill: Capillary refill takes less than 2 seconds.  Neurological:     Mental Status: He is alert.     Gait: Gait abnormal.     Comments: Patient is alert and can answer most questions appropriately.     Ortho Exam  Imaging: No results found.  Past Medical/Family/Surgical/Social History: Medications & Allergies reviewed per EMR, new medications updated. Patient Active Problem List   Diagnosis Date Noted   Mixed dementia 01/14/2023   Cerebrovascular disease    Multiple lacunar infarcts    Back pain    Hallux valgus (acquired), left foot    Hallux valgus (acquired), right foot     Inflammatory and toxic neuropathy    DVT (deep venous thrombosis) 09/06/2022   Acute pulmonary embolism 09/05/2022   AKI (acute kidney injury) 09/04/2022   Hardening of the aorta (main artery of the heart) 08/27/2022   Pulmonary hypertension 08/27/2022   Type 2 diabetes mellitus without complication 08/27/2022   Dyspnea on exertion 08/15/2022   Rheumatoid arthritis involving multiple sites with positive rheumatoid factor 08/13/2022   Atherosclerosis of native arteries of extremities with intermittent claudication, bilateral legs 08/06/2022   Peripheral vascular disease 08/06/2022   Chest pain 08/06/2022   Elevated brain natriuretic peptide (BNP) level 08/06/2022   Neck pain 06/19/2022   Anemia in chronic kidney disease 11/07/2021   Hyperlipidemia 11/07/2021   Heart murmur 11/07/2021   History of anemia due to CKD 09/25/2021   Overactive bladder 09/01/2020   Chronic kidney disease, stage 4 (severe) 07/11/2020   Essential hypertension 07/11/2020   Generalized anxiety disorder 05/08/2020   Malignant tumor of prostate 02/08/2020   Past Medical History:  Diagnosis Date   Acute pulmonary embolism 09/05/2022   AKI (acute kidney injury) 09/04/2022   Anemia in chronic kidney disease 11/07/2021   Atherosclerosis of native arteries of extremities with intermittent claudication, bilateral legs 08/06/2022   Back pain    Cerebrovascular disease    advanced per MRI   Chest pain 08/06/2022   Chronic kidney disease, stage 4 (severe) 07/11/2020   DVT (deep venous thrombosis) 09/06/2022   Dyspnea on exertion 08/15/2022   Elevated brain natriuretic peptide (BNP) level 08/06/2022   Essential hypertension 07/11/2020   Generalized anxiety disorder 05/08/2020   Hallux valgus (acquired), left foot    Hallux valgus (acquired), right foot    Hardening of the aorta (main artery of the heart) 08/27/2022   Heart murmur    Hyperlipidemia 11/07/2021   Hypertensive urgency    Inflammatory and toxic  neuropathy    Malignant tumor of prostate 02/08/2020   Mixed dementia 01/14/2023   Multiple lacunar infarcts    multiple lacunar infarcts within the bilateral deep gray nuclei/internal capsules, multiple chronic infarcts within the right and left cerebellar hemispheres, and chronic parenchymal microhemorrhages with a thalamic and cerebellar predominance   Neck pain 06/19/2022   Overactive bladder 09/01/2020   Peripheral polyneuropathy    Peripheral vascular disease 08/06/2022   Pulmonary hypertension 08/27/2022   Rheumatoid arthritis involving multiple sites with positive rheumatoid factor 08/13/2022   Type 2 diabetes mellitus without complication 08/27/2022   Family History  Problem Relation Age of Onset   Breast cancer Neg Hx    Colon cancer Neg Hx    Prostate cancer Neg Hx    Pancreatic cancer Neg Hx    Past Surgical History:  Procedure Laterality Date   BACK SURGERY     PROSTATE BIOPSY     RIGHT/LEFT HEART CATH AND CORONARY ANGIOGRAPHY N/A 08/07/2022  Procedure: RIGHT/LEFT HEART CATH AND CORONARY ANGIOGRAPHY;  Surgeon: [place], Peter M, MD;  Location: [facility] INVASIVE CV LAB;  Service: Cardiovascular;  Laterality: N/A;   Social History   Occupational History   Occupation: Retired  Tobacco Use   Smoking status: Former    Types: Cigarettes   Smokeless tobacco: Never   Tobacco comments:    Patient states he smokes in the 1960's for about 4 months.  Smoked about half a pack a day.  Updated 10/08/2022 am  Vaping Use   Vaping status: Never Used  Substance and Sexual Activity   Alcohol use: Not Currently    Comment: rare   Drug use: No   Sexual activity: Yes

## 2023-03-28 ENCOUNTER — Ambulatory Visit (HOSPITAL_COMMUNITY)
Admission: RE | Admit: 2023-03-28 | Discharge: 2023-03-28 | Disposition: A | Discharge status: Home / Self Care | Payer: Medicare Other | Source: Ambulatory Visit | Attending: Nephrology | Admitting: Nephrology

## 2023-03-28 VITALS — BP 120/74 | HR 58 | Temp 97.6°F | Resp 17

## 2023-03-28 DIAGNOSIS — N189 Chronic kidney disease, unspecified: Secondary | ICD-10-CM | POA: Diagnosis present

## 2023-03-28 DIAGNOSIS — Z862 Personal history of diseases of the blood and blood-forming organs and certain disorders involving the immune mechanism: Secondary | ICD-10-CM | POA: Insufficient documentation

## 2023-03-28 LAB — POCT HEMOGLOBIN-HEMACUE: Hemoglobin: 9.8 g/dL — ABNORMAL LOW (ref 13.0–17.0)

## 2023-03-28 MED ORDER — DARBEPOETIN ALFA 60 MCG/0.3ML IJ SOSY
PREFILLED_SYRINGE | INTRAMUSCULAR | Status: AC
  Filled 2023-03-28: qty 0.3

## 2023-03-28 MED ORDER — DARBEPOETIN ALFA 60 MCG/0.3ML IJ SOSY
60.0000 ug | PREFILLED_SYRINGE | INTRAMUSCULAR | Status: DC
  Administered 2023-03-28: 60 ug via SUBCUTANEOUS

## 2023-03-30 ENCOUNTER — Encounter (HOSPITAL_COMMUNITY): Payer: Self-pay

## 2023-04-01 ENCOUNTER — Ambulatory Visit: Payer: Medicare HMO | Admitting: Pulmonary Disease

## 2023-04-01 ENCOUNTER — Encounter: Payer: Self-pay | Admitting: Pulmonary Disease

## 2023-04-01 VITALS — BP 130/68 | HR 67 | Ht 70.5 in | Wt 147.6 lb

## 2023-04-01 DIAGNOSIS — G4733 Obstructive sleep apnea (adult) (pediatric): Secondary | ICD-10-CM | POA: Diagnosis not present

## 2023-04-01 DIAGNOSIS — Z86711 Personal history of pulmonary embolism: Secondary | ICD-10-CM | POA: Diagnosis not present

## 2023-04-01 DIAGNOSIS — I272 Pulmonary hypertension, unspecified: Secondary | ICD-10-CM | POA: Diagnosis not present

## 2023-04-01 DIAGNOSIS — Z87891 Personal history of nicotine dependence: Secondary | ICD-10-CM

## 2023-04-01 DIAGNOSIS — Z7901 Long term (current) use of anticoagulants: Secondary | ICD-10-CM

## 2023-04-01 DIAGNOSIS — R0609 Other forms of dyspnea: Secondary | ICD-10-CM

## 2023-04-01 MED ORDER — APIXABAN 5 MG PO TABS: 5.0000 mg | ORAL_TABLET | Freq: Two times a day (BID) | ORAL | 11 refills | Status: DC

## 2023-04-01 NOTE — Patient Instructions (Signed)
 Eliquis refilled  Continue Stiolto 2 puffs once a day  Continue going to the gym  I am so glad the echocardiogram shows improvement this is reassuring  New order today for CPAP machine based on your sleep study.  Return to clinic in 3 months or sooner as needed with Dr. Judeth Horn

## 2023-04-01 NOTE — Progress Notes (Signed)
 @Patient  ID: Derek Blackburn, male    DOB: 1935-03-02, 88 y.o.   MRN: [id number]  Chief Complaint  Patient presents with   Follow-up    Pt is having consistent sob during exertion    Referring provider: Diamantina Providence, FNP  HPI:   88 y.o.   man whom we are seeing for evaluation of dyspnea on exertion.  Most recent cardiology note reviewed.  Returns for routine follow-up.  Echocardiogram in interim shows resolution of RV dysfunction normalization of the RV size.  Normalization of RA size.  All excellent news in regards to possible pulmonary hypertension.  Likely majority of RV dysfunction related to acute PE.  From dyspnea that waxes did not improve.  He is going to the gym and exercising without limitation.  These are great signs.  He continues to [employer] 2 puffs once a day.  HPI at initial visit: Dyspnea on exertion present for a few weeks likely wife states it sounds like it is dated relatively acutely.  She remembers at night in July they were at dinner.  No issues walking and negative.  At walking out of dinner he was quite dyspneic and labored.  He was hospitalized and seen in the ED several times in July.  First admitted for chest pain.  On send ambulating Supple out and seemed fine.  At that time he underwent left and right heart cath that showed no significant coronary disease but a PVR of 4, mean PA pressure of 32 LVEDP of 7.  Cardiac index and output were preserved.  He was hospitalized again for high blood pressure.  Then in August hospitalized again for shortness of breath.  He has CKD so note CTA protocol was performed with a VQ scan with high probability for multiple areas of pulmonary embolism.  He was placed on anticoagulation.  Dyspnea still persists.  Not really any better.  We discussed role and rationale for further evaluation of this.  As well as evaluation of possible causes of pulmonary hypertension.  Certainly pulmonary tension could be contributing to his  symptoms.  Reviewed most recent chest x-ray 08/2022 that looks clear on my review interpretation.  CT chest without contrast 08/2022 showed some scattered areas of focal fibrosis likely postinflammatory on my review and interpretation, otherwise clear lungs.   Questionaires / Pulmonary Flowsheets:   ACT:      No data to display          MMRC:     No data to display          Epworth:      No data to display          Tests:   FENO:  No results found for: "NITRICOXIDE"  PFT:    Latest Ref Rng & Units 01/01/2023    1:59 PM  PFT Results  FVC-Pre L 1.95   FVC-Predicted Pre % 51   FVC-Post L 1.65   FVC-Predicted Post % 43   Pre FEV1/FVC % % 68   Post FEV1/FCV % % 71   FEV1-Pre L 1.32   FEV1-Predicted Pre % 49   FEV1-Post L 1.18   DLCO uncorrected ml/min/mmHg 10.24   DLCO UNC% % 43   DLCO corrected ml/min/mmHg 12.30   DLCO COR %Predicted % 51   DLVA Predicted % 86     WALK:      No data to display          Imaging: Personally reviewed as per EMR and  discussion in this note No results found.  Lab Results: Personally reviewed CBC    Component Value Date/Time   WBC 8.4 10/25/2022 2137   RBC 3.34 (L) 10/25/2022 2137   HGB 9.8 (L) 03/28/2023 1112   HCT 32.5 (L) 10/25/2022 2137   PLT 269 10/25/2022 2137   MCV 97.3 10/25/2022 2137   MCH 29.3 10/25/2022 2137   MCHC 30.2 10/25/2022 2137   RDW 14.2 10/25/2022 2137   LYMPHSABS 0.9 10/25/2022 2137   MONOABS 0.4 10/25/2022 2137   EOSABS 0.4 10/25/2022 2137   BASOSABS 0.1 10/25/2022 2137    BMET    Component Value Date/Time   NA 140 10/25/2022 2137   K 4.1 10/25/2022 2137   CL 110 10/25/2022 2137   CO2 19 (L) 10/25/2022 2137   GLUCOSE 142 (H) 10/25/2022 2137   BUN 35 (H) 10/25/2022 2137   CREATININE 2.21 (H) 10/25/2022 2137   CALCIUM 9.7 10/25/2022 2137   GFRNONAA 28 (L) 10/25/2022 2137   GFRAA 61 (L) 01/21/2011 1720    BNP    Component Value Date/Time   BNP 1,767.1 (H) 09/03/2022 1519     ProBNP No results found for: "PROBNP"  Specialty Problems       Pulmonary Problems   Dyspnea on exertion    No Known Allergies  Immunization History  Administered Date(s) Administered   Tdap 10/01/2020    Past Medical History:  Diagnosis Date   Acute pulmonary embolism 09/05/2022   AKI (acute kidney injury) 09/04/2022   Anemia in chronic kidney disease 11/07/2021   Atherosclerosis of native arteries of extremities with intermittent claudication, bilateral legs 08/06/2022   Back pain    Cerebrovascular disease    advanced per MRI   Chest pain 08/06/2022   Chronic kidney disease, stage 4 (severe) 07/11/2020   DVT (deep venous thrombosis) 09/06/2022   Dyspnea on exertion 08/15/2022   Elevated brain natriuretic peptide (BNP) level 08/06/2022   Essential hypertension 07/11/2020   Generalized anxiety disorder 05/08/2020   Hallux valgus (acquired), left foot    Hallux valgus (acquired), right foot    Hardening of the aorta (main artery of the heart) 08/27/2022   Heart murmur    Hyperlipidemia 11/07/2021   Hypertensive urgency    Inflammatory and toxic neuropathy    Malignant tumor of prostate 02/08/2020   Mixed dementia 01/14/2023   Multiple lacunar infarcts    multiple lacunar infarcts within the bilateral deep gray nuclei/internal capsules, multiple chronic infarcts within the right and left cerebellar hemispheres, and chronic parenchymal microhemorrhages with a thalamic and cerebellar predominance   Neck pain 06/19/2022   Overactive bladder 09/01/2020   Peripheral polyneuropathy    Peripheral vascular disease 08/06/2022   Pulmonary hypertension 08/27/2022   Rheumatoid arthritis involving multiple sites with positive rheumatoid factor 08/13/2022   Type 2 diabetes mellitus without complication 08/27/2022    Tobacco History: Social History   Tobacco Use  Smoking Status Former   Types: Cigarettes  Smokeless Tobacco Never  Tobacco Comments   Patient states he  smokes in the 1960's for about 4 months.  Smoked about half a pack a day.  Updated 10/08/2022 am   Counseling given: Not Answered Tobacco comments: Patient states he smokes in the 1960's for about 4 months.  Smoked about half a pack a day.  Updated 10/08/2022 am   Continue to not smoke  Outpatient Encounter Medications as of 04/01/2023  Medication Sig   acetaminophen (TYLENOL) 650 MG CR tablet Take 650 mg by mouth  at bedtime.   amLODipine (NORVASC) 10 MG tablet Take 1 tablet (10 mg total) by mouth daily.   Apoaequorin (PREVAGEN) 10 MG CAPS Take 10 mg by mouth daily.   atorvastatin (LIPITOR) 10 MG tablet Take 1 tablet (10 mg total) by mouth daily.   Calcium Carbonate (CALCIUM 500 PO) Take 500 mg by mouth daily.   carvedilol (COREG) 6.25 MG tablet Take 1 tablet (6.25 mg total) by mouth 2 (two) times daily.   darolutamide (NUBEQA) 300 MG tablet Take 600 mg by mouth 2 (two) times daily with a meal. This medication treats Prostate Cancer.   diclofenac Sodium (VOLTAREN) 1 % GEL Apply 2 g topically 4 (four) times daily.   donepezil (ARICEPT) 5 MG tablet at bedtime.   hydrALAZINE (APRESOLINE) 50 MG tablet Take 50 mg by mouth 3 (three) times daily.   latanoprost (XALATAN) 0.005 % ophthalmic solution Place 1 drop into both eyes at bedtime.   loratadine (CLARITIN) 10 MG tablet Take 10 mg by mouth daily.   losartan (COZAAR) 25 MG tablet Take 1 tablet (25 mg total) by mouth daily.   Multiple Vitamin (MULITIVITAMIN WITH MINERALS) TABS Take 1 tablet by mouth daily.   Omega-3 Fatty Acids (FISH OIL) 1200 MG CAPS Take 1,200 mg by mouth daily.   timolol (TIMOPTIC) 0.5 % ophthalmic solution Place 1 drop into both eyes 2 (two) times daily.   Tiotropium Bromide-Olodaterol (STIOLTO RESPIMAT) 2.5-2.5 MCG/ACT AERS Inhale 2 puffs into the lungs daily.   vitamin B-12 (CYANOCOBALAMIN) 100 MCG tablet Take 100 mcg by mouth daily.   VITAMIN D, CHOLECALCIFEROL, PO Take 1 tablet by mouth daily.   [DISCONTINUED] apixaban  (ELIQUIS) 5 MG TABS tablet Take 1 tablet (5 mg total) by mouth 2 (two) times daily.   apixaban (ELIQUIS) 5 MG TABS tablet Take 1 tablet (5 mg total) by mouth 2 (two) times daily.   leflunomide (ARAVA) 20 MG tablet Take 20 mg by mouth daily. (Patient not taking: Reported on 04/01/2023)   No facility-administered encounter medications on file as of 04/01/2023.     Review of Systems  Review of Systems  N/a Physical Exam  BP 130/68   Pulse 67   Ht 5' 10.5" (1.791 m)   Wt 147 lb 9.6 oz (67 kg)   SpO2 97%   BMI 20.88 kg/m   Wt Readings from Last 5 Encounters:  04/01/23 147 lb 9.6 oz (67 kg)  03/04/23 149 lb 3.2 oz (67.7 kg)  01/01/23 146 lb (66.2 kg)  12/20/22 148 lb (67.1 kg)  12/02/22 143 lb 12.8 oz (65.2 kg)    BMI Readings from Last 5 Encounters:  04/01/23 20.88 kg/m  03/04/23 20.81 kg/m  01/01/23 20.95 kg/m  12/20/22 21.24 kg/m  12/02/22 20.63 kg/m     Physical Exam General: Sitting in chair, no acute distress Eyes: EOMI, no icterus Neck: Supple, no JVP Pulmonary: Clear, normal work of breathing Cardiovascular: Warm, regular rate and rhythm Abdomen: Nondistended, bowel sounds present MSK: No synovitis, no joint effusion Neuro: Ambulates with assistance of cane, no focal deficits Psych: Normal mood, full affect.  Assessment & Plan:   Pulmonary hypertension: Presumed Group 1 PAH given elevated PVR and normal wedge pressure/LVEDP.  Also at risk for group 4 disease given acute blood clot.  However signs of pulm hypertension on echocardiogram preceded this.  He has aortic valve disease.  This essentially contraindicates PDE 5.  Quite possible there is only group 2 disease despite normal LVEDP.  Frankly, given his age and  otherwise mild symptoms, do not think you will benefit from pulmonary vasodilators.  Fortunately, repeat echocardiogram 01/2023 with normalization of RV size and function, normalization of RA size which argues that acute PE was likely driving hemodynamic  on right heart cath.  Polysomnography in interim demonstrated sleep apnea.  New order today for auto titrating CPAP.  Submassive PE: Right-sided changes somewhat chronic but seem worse after PE.  Repeat TTE 01/2023 with normalization of RA size, RV size, normalization of RV function.  Reassuring.  Recommend lifelong anticoagulation.  Dyspnea on exertion: Likely related to the above.  PFTs with restriction.  Likely weakness, frailty.  No ILD on CT scan 08/2022.  Continue Stiolto.  He is exercising which is encouraging.   Return in about 3 months (around 07/02/2023) for f/u Dr. Judeth Horn.   Karren Burly, MD 04/01/2023

## 2023-04-07 ENCOUNTER — Ambulatory Visit: Admitting: Physical Medicine and Rehabilitation

## 2023-04-07 ENCOUNTER — Other Ambulatory Visit: Payer: Self-pay

## 2023-04-07 VITALS — BP 176/96 | HR 71

## 2023-04-07 DIAGNOSIS — M5416 Radiculopathy, lumbar region: Secondary | ICD-10-CM | POA: Diagnosis not present

## 2023-04-07 MED ORDER — METHYLPREDNISOLONE ACETATE 40 MG/ML IJ SUSP: 40.0000 mg | Freq: Once | INTRAMUSCULAR | Status: DC

## 2023-04-07 NOTE — Progress Notes (Signed)
 Pain Scale   Average Pain 6        +Driver, -BT, -Dye Allergies.

## 2023-04-07 NOTE — Patient Instructions (Signed)

## 2023-04-10 ENCOUNTER — Encounter (HOSPITAL_COMMUNITY): Payer: Self-pay

## 2023-04-10 ENCOUNTER — Emergency Department (HOSPITAL_COMMUNITY)
Admission: EM | Admit: 2023-04-10 | Discharge: 2023-04-29 | Disposition: E | Attending: Emergency Medicine | Admitting: Emergency Medicine

## 2023-04-10 DIAGNOSIS — W06XXXA Fall from bed, initial encounter: Secondary | ICD-10-CM | POA: Diagnosis not present

## 2023-04-10 DIAGNOSIS — I469 Cardiac arrest, cause unspecified: Secondary | ICD-10-CM | POA: Insufficient documentation

## 2023-04-10 DIAGNOSIS — D631 Anemia in chronic kidney disease: Secondary | ICD-10-CM | POA: Diagnosis not present

## 2023-04-10 DIAGNOSIS — Z7901 Long term (current) use of anticoagulants: Secondary | ICD-10-CM | POA: Insufficient documentation

## 2023-04-10 DIAGNOSIS — Z79899 Other long term (current) drug therapy: Secondary | ICD-10-CM | POA: Insufficient documentation

## 2023-04-10 DIAGNOSIS — N184 Chronic kidney disease, stage 4 (severe): Secondary | ICD-10-CM | POA: Diagnosis not present

## 2023-04-10 DIAGNOSIS — E119 Type 2 diabetes mellitus without complications: Secondary | ICD-10-CM | POA: Insufficient documentation

## 2023-04-10 DIAGNOSIS — I129 Hypertensive chronic kidney disease with stage 1 through stage 4 chronic kidney disease, or unspecified chronic kidney disease: Secondary | ICD-10-CM | POA: Diagnosis not present

## 2023-04-10 LAB — CBG MONITORING, ED: Glucose-Capillary: 120 mg/dL — ABNORMAL HIGH (ref 70–99)

## 2023-04-10 MED ORDER — EPINEPHRINE 1 MG/10ML IJ SOSY
PREFILLED_SYRINGE | INTRAMUSCULAR | Status: AC | PRN
Start: 2023-04-10 — End: 2023-04-10
  Administered 2023-04-10 (×2): 1 mg via INTRAVENOUS

## 2023-04-10 MED ORDER — SODIUM CHLORIDE 0.9 % IV SOLN
INTRAVENOUS | Status: AC | PRN
  Administered 2023-04-10: 1000 mL via INTRAVENOUS

## 2023-04-11 ENCOUNTER — Encounter (HOSPITAL_COMMUNITY): Payer: Medicare Other

## 2023-04-15 NOTE — Progress Notes (Signed)
 Derek Blackburn - 88 y.o. male MRN [id number]  Date of birth: August 27, 1935  Office Visit Note: Visit Date: 04/07/2023 PCP: Diamantina Providence, FNP Referred by: Diamantina Providence, FNP  Subjective: Chief Complaint  Patient presents with   Lower Back - Pain   HPI:  Derek Blackburn is a 88 y.o. male who comes in today at the request of Ellin Goodie, FNP for planned Bilateral L3-4 Lumbar Transforaminal epidural steroid injection with fluoroscopic guidance.  The patient has failed conservative care including home exercise, medications, time and activity modification.  This injection will be diagnostic and hopefully therapeutic.  Please see requesting physician notes for further details and justification.   ROS Otherwise per HPI.  Assessment & Plan: Visit Diagnoses:    ICD-10-CM   1. Lumbar radiculopathy  M54.16 XR C-ARM NO REPORT    DISCONTINUED: methylPREDNISolone acetate (DEPO-MEDROL) injection 40 mg    CANCELED: Epidural Steroid injection      Plan: No additional findings.   Meds & Orders:  Meds ordered this encounter  Medications   DISCONTD: methylPREDNISolone acetate (DEPO-MEDROL) injection 40 mg    Orders Placed This Encounter  Procedures   XR C-ARM NO REPORT    Follow-up: No follow-ups on file.   Procedures: Lumbosacral Transforaminal Epidural Steroid Injection - Sub-Pedicular Approach with Fluoroscopic Guidance  Patient: Derek Blackburn      Date of Birth: 02/02/1935 MRN: [id number] PCP: Diamantina Providence, FNP      Visit Date: 04/07/2023   Universal Protocol:    Date/Time: 04/07/2023  Consent Given By: the patient  Position: PRONE  Additional Comments: Vital signs were monitored before and after the procedure. Patient was prepped and draped in the usual sterile fashion. The correct patient, procedure, and site was verified.   Injection Procedure Details:   Procedure diagnoses: Lumbar radiculopathy [M54.16]    Meds Administered:  Meds  ordered this encounter  Medications   DISCONTD: methylPREDNISolone acetate (DEPO-MEDROL) injection 40 mg    Laterality: Bilateral  Location/Site: L3  Needle:5.0 in., 22 ga.  Short bevel or Quincke spinal needle  Needle Placement: Transforaminal  Findings:    -Comments: Excellent flow of contrast along the nerve, nerve root and into the epidural space.  Procedure Details: After squaring off the end-plates to get a true AP view, the C-arm was positioned so that an oblique view of the foramen as noted above was visualized. The target area is just inferior to the "nose of the scotty dog" or sub pedicular. The soft tissues overlying this structure were infiltrated with 2-3 ml. of 1% Lidocaine without Epinephrine.  The spinal needle was inserted toward the target using a "trajectory" view along the fluoroscope beam.  Under AP and lateral visualization, the needle was advanced so it did not puncture dura and was located close the 6 O'Clock position of the pedical in AP tracterory. Biplanar projections were used to confirm position. Aspiration was confirmed to be negative for CSF and/or blood. A 1-2 ml. volume of Isovue-250 was injected and flow of contrast was noted at each level. Radiographs were obtained for documentation purposes.   After attaining the desired flow of contrast documented above, a 0.5 to 1.0 ml test dose of 0.25% Marcaine was injected into each respective transforaminal space.  The patient was observed for 90 seconds post injection.  After no sensory deficits were reported, and normal lower extremity motor function was noted,   the above injectate was administered so that equal amounts of the injectate  were placed at each foramen (level) into the transforaminal epidural space.   Additional Comments:  No complications occurred Dressing: 2 x 2 sterile gauze and Band-Aid    Post-procedure details: Patient was observed during the procedure. Post-procedure instructions were  reviewed.  Patient left the clinic in stable condition.      Clinical History: CLINICAL DATA:  Back pain. History of prostate cancer. History of back surgery.   EXAM: MRI LUMBAR SPINE WITHOUT CONTRAST   TECHNIQUE: Multiplanar, multisequence MR imaging of the lumbar spine was performed. No intravenous contrast was administered.   COMPARISON:  None Available.   FINDINGS: Segmentation:  Standard.   Alignment:  There is straightening of the normal lumbar lordosis.   Vertebrae: There is diffusely heterogeneous marrow signal throughout the lumbar spine in the sacrum. This may be related to a history of radiation therapy. Correlate with patient's history. There is osseous fusion L4-L5 and L5-S1.   Conus medullaris and cauda equina: Conus extends to the L1-L2 disc space level. Conus and cauda equina appear normal.   Paraspinal and other soft tissues: There are multiple T2 hyperintense renal lesions lesions are nonspecific, but favored to represent simple renal cysts. These require no further workup. Fatty atrophy of the paraspinal musculature.   Disc levels:   T12-L1: Severe left and moderate right facet degenerative change. Eccentric right disc bulge. Mild overall spinal canal narrowing. Severe right neural foraminal narrowing.   L1-L2: Severe bilateral facet degenerative change. Eccentric left disc bulge. Moderate spinal canal narrowing. Severe bilateral neural foraminal narrowing.   L2-L3: Severe bilateral facet degenerative change. Short pedicles. Circumferential disc bulge. Severe spinal canal narrowing. Moderate left and mild right neural foraminal narrowing.   L3-L4: Severe bilateral facet degenerative change. Short pedicles. Circumferential disc bulge. Severe spinal canal narrowing. Moderate right and mild left neural foraminal narrowing.   L4-L5: Severe bilateral facet degenerative change. No spinal canal narrowing. No significant disc bulge. Moderate left and  mild right neural foraminal narrowing.   L5-S1: Moderate bilateral facet degenerative change. No significant disc bulge. No spinal canal narrowing. Mild bilateral neural foraminal narrowing.   IMPRESSION: 1. Severe spinal canal narrowing at L2-L3, L3-L4, and L4-L5. 2. Severe neural foraminal narrowing at L1-L2 (bilateral) and T12-L1 (right). 3. Diffusely heterogeneous marrow signal throughout the lumbar spine and the sacrum. This may be related to a history of radiation therapy. Correlate with patient's history.     Electronically Signed   By: Lorenza Cambridge M.D.   On: 05/07/2022 14:00     Objective:  VS:  HT:    WT:   BMI:     BP:(!) 176/96  HR:71bpm  TEMP: ( )  RESP:  Physical Exam Vitals and nursing note reviewed.  Constitutional:      General: He is not in acute distress.    Appearance: Normal appearance. He is not ill-appearing.  HENT:     Head: Normocephalic and atraumatic.     Right Ear: External ear normal.     Left Ear: External ear normal.     Nose: No congestion.  Eyes:     Extraocular Movements: Extraocular movements intact.  Cardiovascular:     Rate and Rhythm: Normal rate.     Pulses: Normal pulses.  Pulmonary:     Effort: Pulmonary effort is normal. No respiratory distress.  Abdominal:     General: There is no distension.     Palpations: Abdomen is soft.  Musculoskeletal:        General: No tenderness or  signs of injury.     Cervical back: Neck supple.     Right lower leg: No edema.     Left lower leg: No edema.     Comments: Patient has good distal strength without clonus.  Skin:    Findings: No erythema or rash.  Neurological:     General: No focal deficit present.     Mental Status: He is alert and oriented to person, place, and time.     Sensory: No sensory deficit.     Motor: No weakness or abnormal muscle tone.     Coordination: Coordination normal.  Psychiatric:        Mood and Affect: Mood normal.        Behavior: Behavior normal.       Imaging: No results found.

## 2023-04-25 ENCOUNTER — Encounter (HOSPITAL_COMMUNITY)

## 2023-04-29 NOTE — ED Provider Notes (Addendum)
 [facility] EMERGENCY DEPARTMENT AT [facility] Provider Note   CSN: [id number] Arrival date & time: 04/23/2023  1400     History  Chief Complaint  Patient presents with   Post CPR    Derek Blackburn is a 88 y.o. male.  HPI Patient presented postcardiac arrest being paced.  Reportedly had a fall at around 930 this morning.  Stayed on the floor.  Reportedly fell from bed to carpeted floor.  Staying on the floor is not unusual for him and will sometimes sleep on the floor due to back pain.  Is on anticoagulation from a embolism.  Reportedly was confused for EMS and upon arrival patient became less responsive became more bradycardic and arrested.   Past Medical History:  Diagnosis Date   Acute pulmonary embolism 09/05/2022   AKI (acute kidney injury) 09/04/2022   Anemia in chronic kidney disease 11/07/2021   Atherosclerosis of native arteries of extremities with intermittent claudication, bilateral legs 08/06/2022   Back pain    Cerebrovascular disease    advanced per MRI   Chest pain 08/06/2022   Chronic kidney disease, stage 4 (severe) 07/11/2020   DVT (deep venous thrombosis) 09/06/2022   Dyspnea on exertion 08/15/2022   Elevated brain natriuretic peptide (BNP) level 08/06/2022   Essential hypertension 07/11/2020   Generalized anxiety disorder 05/08/2020   Hallux valgus (acquired), left foot    Hallux valgus (acquired), right foot    Hardening of the aorta (main artery of the heart) 08/27/2022   Heart murmur    Hyperlipidemia 11/07/2021   Hypertensive urgency    Inflammatory and toxic neuropathy    Malignant tumor of prostate 02/08/2020   Mixed dementia 01/14/2023   Multiple lacunar infarcts    multiple lacunar infarcts within the bilateral deep gray nuclei/internal capsules, multiple chronic infarcts within the right and left cerebellar hemispheres, and chronic parenchymal microhemorrhages with a thalamic and cerebellar predominance   Neck pain 06/19/2022    Overactive bladder 09/01/2020   Peripheral polyneuropathy    Peripheral vascular disease 08/06/2022   Pulmonary hypertension 08/27/2022   Rheumatoid arthritis involving multiple sites with positive rheumatoid factor 08/13/2022   Type 2 diabetes mellitus without complication 08/27/2022    Home Medications Prior to Admission medications   Medication Sig Start Date End Date Taking? Authorizing Provider  acetaminophen (TYLENOL) 650 MG CR tablet Take 650 mg by mouth at bedtime.    [provider]  amLODipine (NORVASC) 10 MG tablet Take 1 tablet (10 mg total) by mouth daily. 06/03/22   Kathleene Hazel, MD  apixaban (ELIQUIS) 5 MG TABS tablet Take 1 tablet (5 mg total) by mouth 2 (two) times daily. 04/01/23   Hunsucker, Lesia Sago, MD  Apoaequorin (PREVAGEN) 10 MG CAPS Take 10 mg by mouth daily.    [provider]  atorvastatin (LIPITOR) 10 MG tablet Take 1 tablet (10 mg total) by mouth daily. 12/13/22   Sharlene Dory, PA-C  Calcium Carbonate (CALCIUM 500 PO) Take 500 mg by mouth daily.    [provider]  carvedilol (COREG) 6.25 MG tablet Take 1 tablet (6.25 mg total) by mouth 2 (two) times daily. 12/12/22   Sharlene Dory, PA-C  darolutamide (NUBEQA) 300 MG tablet Take 600 mg by mouth 2 (two) times daily with a meal. This medication treats Prostate Cancer.    [provider]  diclofenac Sodium (VOLTAREN) 1 % GEL Apply 2 g topically 4 (four) times daily. 09/07/22   Noralee Stain, DO  donepezil (  ARICEPT) 5 MG tablet Take 5 mg by mouth at bedtime. 02/18/23   [provider]  hydrALAZINE (APRESOLINE) 50 MG tablet Take 50 mg by mouth 3 (three) times daily. 02/26/23   [provider]  latanoprost (XALATAN) 0.005 % ophthalmic solution Place 1 drop into both eyes at bedtime. 11/18/19   [provider]  leflunomide (ARAVA) 20 MG tablet Take 20 mg by mouth daily. Patient not taking: Reported on 04/01/2023 07/02/22   [provider]   loratadine (CLARITIN) 10 MG tablet Take 10 mg by mouth daily.    [provider]  losartan (COZAAR) 25 MG tablet Take 1 tablet (25 mg total) by mouth daily. 12/12/22   Sharlene Dory, PA-C  Multiple Vitamin (MULITIVITAMIN WITH MINERALS) TABS Take 1 tablet by mouth daily.    [provider]  Omega-3 Fatty Acids (FISH OIL) 1200 MG CAPS Take 1,200 mg by mouth daily.    [provider]  timolol (TIMOPTIC) 0.5 % ophthalmic solution Place 1 drop into both eyes 2 (two) times daily. 03/23/22   [provider]  Tiotropium Bromide-Olodaterol (STIOLTO RESPIMAT) 2.5-2.5 MCG/ACT AERS Inhale 2 puffs into the lungs daily. 10/08/22   Hunsucker, Lesia Sago, MD  vitamin B-12 (CYANOCOBALAMIN) 100 MCG tablet Take 100 mcg by mouth daily.    [provider]  VITAMIN D, CHOLECALCIFEROL, PO Take 1 tablet by mouth daily.    [provider]      Allergies    Patient has no known allergies.    Review of Systems   Review of Systems  Physical Exam Updated Vital Signs Pulse (!) 0 Comment: CPR started Physical Exam Vitals and nursing note reviewed.  HENT:     Head: Atraumatic.  Pulmonary:     Comments: Harsh breath sounds with bagging but equal bilaterally. Abdominal:     Tenderness: There is no abdominal tenderness.  Musculoskeletal:     Comments: I will line right lower extremity.  Neurological:     Comments: Comatose.  No response to pain.  No spontaneous respirations.     ED Results / Procedures / Treatments   Labs (all labs ordered are listed, but only abnormal results are displayed) Labs Reviewed  CBG MONITORING, ED - Abnormal; Notable for the following components:      Result Value   Glucose-Capillary 120 (*)    All other components within normal limits    EKG None  Radiology No results found.  Procedures Procedures    Medications Ordered in ED Medications  EPINEPHrine (ADRENALIN) 1 MG/10ML injection (1 mg Intravenous Given 04/18/2023  1405)    ED Course/ Medical Decision Making/ A&P                                 Medical Decision Making Risk Prescription drug management.   Patient came in post CPR.  Reportedly had fall.  Patient was activated as a level 1 trauma since fall on anticoagulation with cardiac arrest.  Upon arrival did not have clear pulse with the pacing.  CPR started.  Given epinephrine.  Had had an epi drip and reportedly had 7 mg of epinephrine prior to arrival.  Initial bedside ultrasound did not show cardiac activity.  Dr. Janee Morn from trauma surgery arrived.  No fluid seen in belly.  No reversible cause seen.  With equal breath sounds large pleural effusion/hemothorax/tension pneumo felt less likely.  Could have intracranial hemorrhage but cardiac arrest  at this time would not make him an OR candidate.  No clear reversible cause seen.  Time of death 78.  Discussed with medical examiner.  Judeth Cornfield stated that she would talk with [facility].  Later called back and said that case was refused for the medical examiner and was considered natural causes.  Cardiopulmonary Resuscitation (CPR) Procedure Note Directed/Performed by: Benjiman Core I personally directed ancillary staff and/or performed CPR in an effort to regain return of spontaneous circulation and to maintain cardiac, neuro and systemic perfusion.   Family reportedly on their way.  Patient's family is to have arrived.  I informed them of his passing.        Final Clinical Impression(s) / ED Diagnoses Final diagnoses:  Cardiac arrest [facility])    Rx / DC Orders ED Discharge Orders     None         Benjiman Core, MD 04/22/2023 1442    Benjiman Core, MD 04/01/2023 [phone]

## 2023-04-29 NOTE — Code Documentation (Signed)
 Derek Blackburn on pt at this time.

## 2023-04-29 NOTE — ED Notes (Signed)
 Post mortem care provided. Pt placed in post mortem bag and transported to morgue after patient placement verified documentation.

## 2023-04-29 NOTE — ED Triage Notes (Signed)
 Pt BIB [facility] as post CPR at 1400. Pt fell out of bed at 0930 & family reports he wasn't responding, 911 was called, while on the phone with dispatcher he was noted to be pulseless, CPR started <5 min from down time. EMS reports he was asystole for them & en route to ED received x7 Epi's in Rt IO before ROSC obtained. Did receive a total of 25 min CPR before ROSC obtain on scene. He was then started on an Epi gtt & paced at 70 bpm. EMS reports upon arrival that he has a Hx of high cholesterol, HTN & is taking Eliquis. Pt was activated as a Level 1 at 140. Unsure of total downtime from fall out of bed bed (to carpet) this morning until when pt was found to be unresponsive.

## 2023-04-29 NOTE — Consult Note (Signed)
  88 year old man on anticoagulation for PE reportedly fell from bed to the carpeted floor several hours ago.  Family reported to EMS he had been talking to them but later was found unresponsive.  He was transported as a level 1 trauma with CPR in progress.  On my arrival, CPR was continuing.  He had been intubated by EMS.  GCS 3.  No evidence of pneumothorax or intra-abdominal fluid on ultrasound.  He had not responded to ACLS measures.  Ultrasound also showed no cardiac activity.  Time of death 81.  The ME will be notified as this was a fall.  Violeta Gelinas, MD, MPH, FACS Please use [email or web address] to contact on call provider

## 2023-04-29 NOTE — Progress Notes (Signed)
 Orthopedic Tech Progress Note Patient Details:  Derek Blackburn 08-28-1935 [id number]  Level I trauma, no ortho tech service needs.  Patient ID: Derek Blackburn, male   DOB: Mar 30, 1935, 88 y.o.   MRN: [id number]  Docia Furl 04/25/2023, 4:23 PM

## 2023-04-29 NOTE — Code Documentation (Signed)
 [place] used via Janee Morn, MD during pulse check.

## 2023-04-29 DEATH — deceased

## 2023-06-30 ENCOUNTER — Ambulatory Visit: Payer: Medicare HMO | Admitting: Physician Assistant

## 2023-07-29 ENCOUNTER — Ambulatory Visit: Admitting: Pulmonary Disease

## 2023-08-16 IMAGING — CT NM PET TUM IMG SKULL BASE T - THIGH
1 of 7 series · 1 of 25 positions shown · non-contrast
Comparison: None Available.

CLINICAL DATA: Prostate carcinoma with biochemical recurrence.

EXAM:
NUCLEAR MEDICINE PET SKULL BASE TO THIGH
TECHNIQUE: 9.9 mCi F18 Piflufolastat (Pylarify) was injected intravenously.
Full-ring PET imaging was performed from the skull base to thigh
after the radiotracer. CT data was obtained and used for attenuation
correction and anatomic localization.

[Series 4: pet sk_thigh ac · axial · 5.0mm · 4.07mm/px · 1 of 232 slices shown]
[im 139/232]
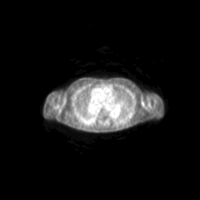

[1 of 25 positions shown; findings below may reference images not displayed]

FINDINGS: NECK

No radiotracer activity in neck lymph nodes.

Incidental CT finding: None

CHEST

Mild radiotracer activity in the hila is favored related to prior
granulomatous disease. There are calcified lymph nodes in the
mediastinum and hila.

No suspicious pulmonary nodules.

Incidental CT finding: Enlargement of the thyroid gland is favored
benign goiter.

ABDOMEN/PELVIS

Prostate: Moderately intense radiotracer activity within the LEFT
and RIGHT lobe of the prostate gland. The activity is diffuse with
SUV max equal 5.9.

Lymph nodes: No abnormal radiotracer accumulation within pelvic or
abdominal nodes.

Focal activity along the LEFT margin of the bladder is most
consistent with a small bladder diverticulum rather than lymph node.
(Image 190)

Intense activity medial to the LEFT and RIGHT adrenal gland is most
consistent with celiac sympathetic ganglia . There is linear soft
tissue densities at this site typical of celiac ganglia (image 132).

Liver: No evidence of liver metastasis

Incidental CT finding: Bilateral nonobstructing renal calculi.
Bilateral simple fluid attenuation renal cysts.

SKELETON

No focal activity to suggest skeletal metastasis. No aggressive
osseous lesion identified on the CT portion exam.
IMPRESSION: 1. Diffuse moderate radiotracer activity within the prostate gland.
Indeterminate for prostate adenocarcinoma
2. No evidence metastatic adenopathy in the pelvis or periaortic
retroperitoneum.
3. No evidence of visceral metastasis or skeletal metastasis.
